# Patient Record
Sex: Male | Born: 1970 | Race: Black or African American | Hispanic: No | Marital: Single | Smoking: Former smoker
Health system: Southern US, Community
[De-identification: ages and names within clinical notes are randomized; demographics above are authoritative.]

## PROBLEM LIST (undated history)

## (undated) ENCOUNTER — Emergency Department (HOSPITAL_COMMUNITY): Admission: EM | Payer: Self-pay | Source: Home / Self Care

## (undated) ENCOUNTER — Ambulatory Visit (HOSPITAL_COMMUNITY): Payer: Self-pay

## (undated) DIAGNOSIS — E119 Type 2 diabetes mellitus without complications: Secondary | ICD-10-CM

## (undated) DIAGNOSIS — I219 Acute myocardial infarction, unspecified: Secondary | ICD-10-CM

## (undated) DIAGNOSIS — I119 Hypertensive heart disease without heart failure: Secondary | ICD-10-CM

## (undated) DIAGNOSIS — I255 Ischemic cardiomyopathy: Secondary | ICD-10-CM

## (undated) DIAGNOSIS — E785 Hyperlipidemia, unspecified: Secondary | ICD-10-CM

## (undated) DIAGNOSIS — Z720 Tobacco use: Secondary | ICD-10-CM

## (undated) DIAGNOSIS — I2510 Atherosclerotic heart disease of native coronary artery without angina pectoris: Secondary | ICD-10-CM

## (undated) DIAGNOSIS — I10 Essential (primary) hypertension: Secondary | ICD-10-CM

## (undated) DIAGNOSIS — I509 Heart failure, unspecified: Secondary | ICD-10-CM

## (undated) HISTORY — DX: Hyperlipidemia, unspecified: E78.5

## (undated) HISTORY — DX: Type 2 diabetes mellitus without complications: E11.9

## (undated) HISTORY — DX: Atherosclerotic heart disease of native coronary artery without angina pectoris: I25.10

## (undated) HISTORY — DX: Hypertensive heart disease without heart failure: I11.9

## (undated) HISTORY — DX: Ischemic cardiomyopathy: I25.5

## (undated) HISTORY — DX: Tobacco use: Z72.0

## (undated) HISTORY — PX: SHOULDER SURGERY: SHX246

## (undated) HISTORY — PX: FRACTURE SURGERY: SHX138

---

## 1998-03-18 ENCOUNTER — Emergency Department (HOSPITAL_COMMUNITY): Admission: EM | Admit: 1998-03-18 | Discharge: 1998-03-18 | Payer: Self-pay | Admitting: *Deleted

## 1998-11-11 ENCOUNTER — Encounter: Payer: Self-pay | Admitting: Emergency Medicine

## 1998-11-11 ENCOUNTER — Emergency Department (HOSPITAL_COMMUNITY): Admission: EM | Admit: 1998-11-11 | Discharge: 1998-11-11 | Payer: Self-pay | Admitting: Emergency Medicine

## 1999-10-24 ENCOUNTER — Encounter: Payer: Self-pay | Admitting: Emergency Medicine

## 1999-10-24 ENCOUNTER — Emergency Department (HOSPITAL_COMMUNITY): Admission: EM | Admit: 1999-10-24 | Discharge: 1999-10-24 | Payer: Self-pay | Admitting: Emergency Medicine

## 2000-03-21 ENCOUNTER — Emergency Department (HOSPITAL_COMMUNITY): Admission: EM | Admit: 2000-03-21 | Discharge: 2000-03-21 | Payer: Self-pay | Admitting: Emergency Medicine

## 2000-07-04 ENCOUNTER — Emergency Department (HOSPITAL_COMMUNITY): Admission: EM | Admit: 2000-07-04 | Discharge: 2000-07-05 | Payer: Self-pay | Admitting: Emergency Medicine

## 2000-07-05 ENCOUNTER — Emergency Department (HOSPITAL_COMMUNITY): Admission: EM | Admit: 2000-07-05 | Discharge: 2000-07-05 | Payer: Self-pay | Admitting: Emergency Medicine

## 2000-08-17 ENCOUNTER — Encounter: Payer: Self-pay | Admitting: Emergency Medicine

## 2000-08-17 ENCOUNTER — Emergency Department (HOSPITAL_COMMUNITY): Admission: EM | Admit: 2000-08-17 | Discharge: 2000-08-17 | Payer: Self-pay | Admitting: Emergency Medicine

## 2003-02-11 ENCOUNTER — Emergency Department (HOSPITAL_COMMUNITY): Admission: EM | Admit: 2003-02-11 | Discharge: 2003-02-11 | Payer: Self-pay | Admitting: Emergency Medicine

## 2003-11-11 ENCOUNTER — Emergency Department (HOSPITAL_COMMUNITY): Admission: EM | Admit: 2003-11-11 | Discharge: 2003-11-11 | Payer: Self-pay | Admitting: Emergency Medicine

## 2005-03-02 ENCOUNTER — Emergency Department (HOSPITAL_COMMUNITY): Admission: EM | Admit: 2005-03-02 | Discharge: 2005-03-02 | Payer: Self-pay | Admitting: Family Medicine

## 2006-01-14 ENCOUNTER — Emergency Department (HOSPITAL_COMMUNITY): Admission: EM | Admit: 2006-01-14 | Discharge: 2006-01-14 | Payer: Self-pay | Admitting: Emergency Medicine

## 2006-05-27 ENCOUNTER — Emergency Department (HOSPITAL_COMMUNITY): Admission: EM | Admit: 2006-05-27 | Discharge: 2006-05-27 | Payer: Self-pay | Admitting: Emergency Medicine

## 2006-11-17 ENCOUNTER — Emergency Department (HOSPITAL_COMMUNITY): Admission: EM | Admit: 2006-11-17 | Discharge: 2006-11-18 | Payer: Self-pay | Admitting: Emergency Medicine

## 2007-06-08 ENCOUNTER — Ambulatory Visit: Payer: Self-pay | Admitting: Nurse Practitioner

## 2007-06-08 DIAGNOSIS — I10 Essential (primary) hypertension: Secondary | ICD-10-CM | POA: Insufficient documentation

## 2007-06-08 LAB — CONVERTED CEMR LAB
Bilirubin Urine: NEGATIVE
Glucose, Urine, Semiquant: NEGATIVE
Ketones, urine, test strip: NEGATIVE
Nitrite: NEGATIVE
Protein, U semiquant: NEGATIVE
Specific Gravity, Urine: 1.02
Urobilinogen, UA: 0.2
WBC Urine, dipstick: NEGATIVE
pH: 6

## 2007-06-09 ENCOUNTER — Encounter (INDEPENDENT_AMBULATORY_CARE_PROVIDER_SITE_OTHER): Payer: Self-pay | Admitting: Nurse Practitioner

## 2007-06-09 LAB — CONVERTED CEMR LAB
ALT: 97 units/L — ABNORMAL HIGH (ref 0–53)
AST: 46 units/L — ABNORMAL HIGH (ref 0–37)
Albumin: 4.9 g/dL (ref 3.5–5.2)
Alkaline Phosphatase: 145 units/L — ABNORMAL HIGH (ref 39–117)
BUN: 9 mg/dL (ref 6–23)
Basophils Absolute: 0.1 10*3/uL (ref 0.0–0.1)
Basophils Relative: 1 % (ref 0–1)
CO2: 24 meq/L (ref 19–32)
Calcium: 10.4 mg/dL (ref 8.4–10.5)
Chloride: 102 meq/L (ref 96–112)
Cholesterol: 208 mg/dL — ABNORMAL HIGH (ref 0–200)
Creatinine, Ser: 0.94 mg/dL (ref 0.40–1.50)
Eosinophils Absolute: 0.3 10*3/uL (ref 0.0–0.7)
Eosinophils Relative: 4 % (ref 0–5)
Glucose, Bld: 97 mg/dL (ref 70–99)
HCT: 56.5 % — ABNORMAL HIGH (ref 39.0–52.0)
HCV Ab: NEGATIVE
HDL: 45 mg/dL (ref >39)
Hemoglobin: 18 g/dL — ABNORMAL HIGH (ref 13.0–17.0)
Hep A IgM: NEGATIVE
Hep B C IgM: NEGATIVE
Hepatitis B Surface Ag: NEGATIVE
LDL Cholesterol: 127 mg/dL — ABNORMAL HIGH (ref 0–99)
Lymphocytes Relative: 26 % (ref 12–46)
Lymphs Abs: 2.4 10*3/uL (ref 0.7–4.0)
MCHC: 31.9 g/dL (ref 30.0–36.0)
MCV: 91 fL (ref 78.0–100.0)
Monocytes Absolute: 0.6 10*3/uL (ref 0.1–1.0)
Monocytes Relative: 6 % (ref 3–12)
Neutro Abs: 5.7 10*3/uL (ref 1.7–7.7)
Neutrophils Relative %: 63 % (ref 43–77)
Platelets: 204 10*3/uL (ref 150–400)
Potassium: 4.3 meq/L (ref 3.5–5.3)
RBC: 6.21 M/uL — ABNORMAL HIGH (ref 4.22–5.81)
RDW: 14 % (ref 11.5–15.5)
Sodium: 144 meq/L (ref 135–145)
TSH: 1.072 microintl units/mL (ref 0.350–5.50)
Total Bilirubin: 0.5 mg/dL (ref 0.3–1.2)
Total CHOL/HDL Ratio: 4.6
Total Protein: 7.7 g/dL (ref 6.0–8.3)
Triglycerides: 181 mg/dL — ABNORMAL HIGH (ref <150)
VLDL: 36 mg/dL (ref 0–40)
WBC: 9 10*3/uL (ref 4.0–10.5)

## 2007-06-13 ENCOUNTER — Encounter (INDEPENDENT_AMBULATORY_CARE_PROVIDER_SITE_OTHER): Payer: Self-pay | Admitting: Nurse Practitioner

## 2007-06-22 ENCOUNTER — Ambulatory Visit: Payer: Self-pay | Admitting: Nurse Practitioner

## 2007-06-22 ENCOUNTER — Ambulatory Visit: Payer: Self-pay | Admitting: *Deleted

## 2007-07-06 ENCOUNTER — Ambulatory Visit: Payer: Self-pay | Admitting: Nurse Practitioner

## 2007-07-27 ENCOUNTER — Ambulatory Visit: Payer: Self-pay | Admitting: Nurse Practitioner

## 2007-07-27 ENCOUNTER — Telehealth (INDEPENDENT_AMBULATORY_CARE_PROVIDER_SITE_OTHER): Payer: Self-pay | Admitting: Nurse Practitioner

## 2007-09-27 ENCOUNTER — Ambulatory Visit: Payer: Self-pay | Admitting: Nurse Practitioner

## 2007-09-27 LAB — CONVERTED CEMR LAB
ALT: 33 units/L (ref 0–53)
AST: 16 units/L (ref 0–37)
Albumin: 4.4 g/dL (ref 3.5–5.2)
Alkaline Phosphatase: 160 units/L — ABNORMAL HIGH (ref 39–117)
Bilirubin, Direct: 0.1 mg/dL (ref 0.0–0.3)
Cholesterol: 179 mg/dL (ref 0–200)
HDL: 33 mg/dL — ABNORMAL LOW (ref >39)
Indirect Bilirubin: 0.6 mg/dL (ref 0.0–0.9)
LDL Cholesterol: 118 mg/dL — ABNORMAL HIGH (ref 0–99)
Total Bilirubin: 0.7 mg/dL (ref 0.3–1.2)
Total CHOL/HDL Ratio: 5.4
Total Protein: 7.2 g/dL (ref 6.0–8.3)
Triglycerides: 141 mg/dL (ref <150)
VLDL: 28 mg/dL (ref 0–40)

## 2007-09-28 ENCOUNTER — Encounter (INDEPENDENT_AMBULATORY_CARE_PROVIDER_SITE_OTHER): Payer: Self-pay | Admitting: Nurse Practitioner

## 2007-10-10 ENCOUNTER — Ambulatory Visit: Payer: Self-pay | Admitting: Nurse Practitioner

## 2008-05-18 ENCOUNTER — Ambulatory Visit: Payer: Self-pay | Admitting: Infectious Disease

## 2008-05-18 LAB — CONVERTED CEMR LAB
CO2: 26 meq/L
Calcium: 10.2 mg/dL
Chloride: 92 meq/L
Glucose, Bld: 474 mg/dL
Platelets: 287 10*3/uL
Potassium: 4.1 meq/L
RBC: 6.25 M/uL
Sodium: 130 meq/L
WBC: 9.6 10*3/uL

## 2008-05-19 ENCOUNTER — Observation Stay (HOSPITAL_COMMUNITY): Admission: EM | Admit: 2008-05-19 | Discharge: 2008-05-19 | Payer: Self-pay | Admitting: Emergency Medicine

## 2008-05-28 ENCOUNTER — Ambulatory Visit: Payer: Self-pay | Admitting: Nurse Practitioner

## 2008-05-28 DIAGNOSIS — E119 Type 2 diabetes mellitus without complications: Secondary | ICD-10-CM

## 2008-05-28 HISTORY — DX: Type 2 diabetes mellitus without complications: E11.9

## 2008-05-28 LAB — CONVERTED CEMR LAB
Cholesterol, target level: 200 mg/dL
HDL goal, serum: 40 mg/dL
Hgb A1c MFr Bld: 10.7 %
LDL Goal: 100 mg/dL

## 2008-06-18 ENCOUNTER — Encounter (INDEPENDENT_AMBULATORY_CARE_PROVIDER_SITE_OTHER): Payer: Self-pay | Admitting: Nurse Practitioner

## 2008-06-18 ENCOUNTER — Ambulatory Visit: Payer: Self-pay | Admitting: Family Medicine

## 2008-06-18 LAB — CONVERTED CEMR LAB
Bilirubin Urine: NEGATIVE
Blood in Urine, dipstick: NEGATIVE
Ketones, urine, test strip: NEGATIVE
Microalb, Ur: 0.65 mg/dL (ref 0.00–1.89)
Nitrite: NEGATIVE
Protein, U semiquant: NEGATIVE
Specific Gravity, Urine: 1.02
Urobilinogen, UA: NEGATIVE

## 2008-06-20 ENCOUNTER — Ambulatory Visit: Payer: Self-pay | Admitting: Nurse Practitioner

## 2008-06-20 LAB — CONVERTED CEMR LAB
AST: 17 units/L (ref 0–37)
Albumin: 4.8 g/dL (ref 3.5–5.2)
Alkaline Phosphatase: 119 units/L — ABNORMAL HIGH (ref 39–117)
Band Neutrophils: 0 % (ref 0–10)
Basophils Absolute: 0 10*3/uL (ref 0.0–0.1)
Calcium: 10.3 mg/dL (ref 8.4–10.5)
Chloride: 102 meq/L (ref 96–112)
HCT: 50.4 % (ref 39.0–52.0)
LDL Cholesterol: 105 mg/dL — ABNORMAL HIGH (ref 0–99)
Lymphocytes Relative: 20 % (ref 12–46)
Lymphs Abs: 1.4 10*3/uL (ref 0.7–4.0)
Monocytes Absolute: 0.7 10*3/uL (ref 0.1–1.0)
Neutro Abs: 4.6 10*3/uL (ref 1.7–7.7)
Platelets: 267 10*3/uL (ref 150–400)
Potassium: 4.4 meq/L (ref 3.5–5.3)
RBC: 5.85 M/uL — ABNORMAL HIGH (ref 4.22–5.81)
Sodium: 140 meq/L (ref 135–145)
Total Protein: 7.5 g/dL (ref 6.0–8.3)
WBC: 6.9 10*3/uL (ref 4.0–10.5)

## 2008-06-25 ENCOUNTER — Telehealth (INDEPENDENT_AMBULATORY_CARE_PROVIDER_SITE_OTHER): Payer: Self-pay | Admitting: Nurse Practitioner

## 2008-06-27 ENCOUNTER — Ambulatory Visit: Payer: Self-pay | Admitting: Nurse Practitioner

## 2008-07-09 ENCOUNTER — Ambulatory Visit: Payer: Self-pay | Admitting: Nurse Practitioner

## 2008-07-11 ENCOUNTER — Ambulatory Visit: Payer: Self-pay | Admitting: Nurse Practitioner

## 2008-07-16 ENCOUNTER — Telehealth (INDEPENDENT_AMBULATORY_CARE_PROVIDER_SITE_OTHER): Payer: Self-pay | Admitting: Nurse Practitioner

## 2008-07-18 ENCOUNTER — Encounter (INDEPENDENT_AMBULATORY_CARE_PROVIDER_SITE_OTHER): Payer: Self-pay | Admitting: Nurse Practitioner

## 2008-08-07 ENCOUNTER — Encounter (INDEPENDENT_AMBULATORY_CARE_PROVIDER_SITE_OTHER): Payer: Self-pay | Admitting: *Deleted

## 2008-08-13 ENCOUNTER — Ambulatory Visit: Payer: Self-pay | Admitting: Nurse Practitioner

## 2008-08-13 DIAGNOSIS — E8881 Metabolic syndrome: Secondary | ICD-10-CM

## 2008-08-13 HISTORY — DX: Metabolic syndrome: E88.81

## 2008-08-13 HISTORY — DX: Metabolic syndrome: E88.810

## 2008-10-02 ENCOUNTER — Telehealth (INDEPENDENT_AMBULATORY_CARE_PROVIDER_SITE_OTHER): Payer: Self-pay | Admitting: Nurse Practitioner

## 2008-11-28 ENCOUNTER — Ambulatory Visit: Payer: Self-pay | Admitting: Nurse Practitioner

## 2008-11-28 LAB — CONVERTED CEMR LAB: Blood Glucose, Fingerstick: 131

## 2008-12-02 LAB — CONVERTED CEMR LAB: Hgb A1c MFr Bld: 6.5 % — ABNORMAL HIGH (ref 4.6–6.1)

## 2009-01-10 ENCOUNTER — Ambulatory Visit: Payer: Self-pay | Admitting: Nurse Practitioner

## 2009-01-13 ENCOUNTER — Encounter (INDEPENDENT_AMBULATORY_CARE_PROVIDER_SITE_OTHER): Payer: Self-pay | Admitting: Nurse Practitioner

## 2009-02-28 ENCOUNTER — Ambulatory Visit: Payer: Self-pay | Admitting: Nurse Practitioner

## 2009-02-28 LAB — CONVERTED CEMR LAB
Blood Glucose, Fingerstick: 104
Hgb A1c MFr Bld: 6.1 %

## 2009-04-30 ENCOUNTER — Telehealth (INDEPENDENT_AMBULATORY_CARE_PROVIDER_SITE_OTHER): Payer: Self-pay | Admitting: *Deleted

## 2009-06-04 ENCOUNTER — Emergency Department (HOSPITAL_COMMUNITY): Admission: EM | Admit: 2009-06-04 | Discharge: 2009-06-05 | Payer: Self-pay | Admitting: Emergency Medicine

## 2009-08-05 ENCOUNTER — Ambulatory Visit: Payer: Self-pay | Admitting: Nurse Practitioner

## 2009-08-05 DIAGNOSIS — F17200 Nicotine dependence, unspecified, uncomplicated: Secondary | ICD-10-CM

## 2009-08-05 HISTORY — DX: Nicotine dependence, unspecified, uncomplicated: F17.200

## 2009-08-05 LAB — CONVERTED CEMR LAB
Alkaline Phosphatase: 93 units/L (ref 39–117)
Bilirubin, Direct: 0.1 mg/dL (ref 0.0–0.3)
Blood Glucose, Fingerstick: 117
Glucose, Urine, Semiquant: NEGATIVE
Hgb A1c MFr Bld: 10.2 %
Indirect Bilirubin: 0.5 mg/dL (ref 0.0–0.9)
LDL Cholesterol: 106 mg/dL — ABNORMAL HIGH (ref 0–99)
Protein, U semiquant: 30
Specific Gravity, Urine: 1.025
Total Bilirubin: 0.6 mg/dL (ref 0.3–1.2)
Triglycerides: 77 mg/dL (ref <150)
WBC Urine, dipstick: NEGATIVE
pH: 6

## 2009-08-06 ENCOUNTER — Encounter (INDEPENDENT_AMBULATORY_CARE_PROVIDER_SITE_OTHER): Payer: Self-pay | Admitting: Nurse Practitioner

## 2009-08-16 ENCOUNTER — Emergency Department (HOSPITAL_COMMUNITY): Admission: EM | Admit: 2009-08-16 | Discharge: 2009-08-16 | Payer: Self-pay | Admitting: Emergency Medicine

## 2009-09-16 ENCOUNTER — Ambulatory Visit: Payer: Self-pay | Admitting: Nurse Practitioner

## 2009-10-23 ENCOUNTER — Telehealth (INDEPENDENT_AMBULATORY_CARE_PROVIDER_SITE_OTHER): Payer: Self-pay | Admitting: Nurse Practitioner

## 2009-10-29 ENCOUNTER — Ambulatory Visit: Payer: Self-pay | Admitting: Nurse Practitioner

## 2009-10-29 LAB — CONVERTED CEMR LAB
Bilirubin Urine: NEGATIVE
Blood Glucose, AC Bkfst: 115 mg/dL
Glucose, Urine, Semiquant: NEGATIVE
Hgb A1c MFr Bld: 5.9 %
Ketones, urine, test strip: NEGATIVE
Specific Gravity, Urine: 1.01
pH: 5.5

## 2010-02-03 ENCOUNTER — Telehealth (INDEPENDENT_AMBULATORY_CARE_PROVIDER_SITE_OTHER): Payer: Self-pay | Admitting: Nurse Practitioner

## 2010-05-10 LAB — CONVERTED CEMR LAB
Blood Glucose, Fingerstick: 130
Hgb A1c MFr Bld: 7.3 %

## 2010-05-12 NOTE — Progress Notes (Signed)
Summary: stop smoking medication  Phone Note Call from Patient Call back at [facility] Phone [phone] Call back at [phone]   Summary of Call: The pt knows that the provider will get upset with him because he quit smoking two years ago and he started last month smoking ciggars and he is wondering if the provider can prescribe him a prescription for him to stop smoking again.  [facility] [address] Tera Partridge FNP Initial call taken by: Manon Hilding,  April 30, 2009 8:47 AM  Follow-up for Phone Call        forward to N. Daphine Deutscher, FNP Follow-up by: Levon Hedger,  April 30, 2009 4:56 PM  Additional Follow-up for Phone Call Additional follow up Details #1::        YES, I AM VERY UPSET!!! Smoking increases his blood pressure and cholesterol both of which he has worked so hard to improve. I'm not sure if the health department has Chantix(did he get it from there before) I know [place] [facility] has it. Rx in the basket Additional Follow-up by: Lehman Prom FNP,  April 30, 2009 5:20 PM    Additional Follow-up for Phone Call Additional follow up Details #2::    FAXED TO [facility] Follow-up by: Arta Bruce,  May 01, 2009 9:39 AM  New/Updated Medications: CHANTIX STARTING MONTH PAK 0.5 MG X 11 & 1 MG X 42 TABS (VARENICLINE TARTRATE) Take as directed Prescriptions: CHANTIX STARTING MONTH PAK 0.5 MG X 11 & 1 MG X 42 TABS (VARENICLINE TARTRATE) Take as directed  #1 month qs x 0   Entered and Authorized by:   Lehman Prom FNP   Signed by:   Lehman Prom FNP on 04/30/2009   Method used:   Printed then faxed to ...       [facility] (retail)       [address] [place], [place]  [address]       Ph: [phone]       Fax: [phone]   RxID:   [id number]

## 2010-05-12 NOTE — Assessment & Plan Note (Signed)
Summary: Diabetes/HTN  Nurse Visit   Vital Signs:  Patient profile:   40 year old male BP sitting:   136 / 87  (right arm)  Vitals Entered By: Dutch Quint RN (October 29, 2009 10:45 AM) CC: F/U CBG, A1C and U/A Is Patient Diabetic? Yes Did you bring your meter with you today? No Pain Assessment Patient in pain? no      CBG Result 115 CBG Device ID [id number]  Does patient need assistance? Functional Status Self care Ambulation Normal Comments Denies polyphagia, polydypsia or polyuria.  Denies dizziness, change of vision or headaches.  States he does not check his CBG daily, but is taking his meds.   Allergies: No Known Drug Allergies  Patient Instructions: 1)  Diabetes and blood pressure improved 2)  Keep next scheduled office visit  Impression & Recommendations:  Problem # 1:  DIABETES MELLITUS (ICD-250.00)  His updated medication list for this problem includes:    Metformin Hcl 500 Mg Tabs (Metformin hcl) ..... One tablet by mouth two times a day for diabetes    Avalide 300-25 Mg Tabs (Irbesartan-hydrochlorothiazide) .Marland Kitchen... 1 tablet by mouth daily for blood pressure  Orders: Capillary Blood Glucose/CBG (69629) Est. Patient Level I (52841) Hgb A1C (32440NU) UA Dipstick w/o Micro (automated)  (81003)  Problem # 2:  HYPERTENSION, BENIGN ESSENTIAL (ICD-401.1)  His updated medication list for this problem includes:    Norvasc 10 Mg Tabs (Amlodipine besylate) .Marland Kitchen... 1 tablet by mouth daily for blood pressure    Avalide 300-25 Mg Tabs (Irbesartan-hydrochlorothiazide) .Marland Kitchen... 1 tablet by mouth daily for blood pressure  Complete Medication List: 1)  Lipitor 10 Mg Tabs (Atorvastatin calcium) .... Hold 2)  Metformin Hcl 500 Mg Tabs (Metformin hcl) .... One tablet by mouth two times a day for diabetes 3)  Glucometer Elite Classic Kit (Blood glucose monitoring suppl) .... Dispense meter to check blood sugar dx 250.00 4)  Sidekick Blood Glucose System Devi (Blood gluc meter  disp-strips) .... Dispense strips twice daily for blood sugar 5)  Lancets Misc (Lancets) .... Check blood sugar twice daily 6)  Norvasc 10 Mg Tabs (Amlodipine besylate) .Marland Kitchen.. 1 tablet by mouth daily for blood pressure 7)  Avalide 300-25 Mg Tabs (Irbesartan-hydrochlorothiazide) .Marland Kitchen.. 1 tablet by mouth daily for blood pressure  Laboratory Results   Urine Tests  Date/Time Received: 10/29/09  10:07  Routine Urinalysis   Color: lt. yellow Glucose: negative   (Normal Range: Negative) Bilirubin: negative   (Normal Range: Negative) Ketone: negative   (Normal Range: Negative) Spec. Gravity: 1.010   (Normal Range: 1.003-1.035) Blood: trace-intact   (Normal Range: Negative) pH: 5.5   (Normal Range: 5.0-8.0) Protein: negative   (Normal Range: Negative) Urobilinogen: 0.2   (Normal Range: 0-1) Nitrite: negative   (Normal Range: Negative) Leukocyte Esterace: negative   (Normal Range: Negative)     Blood Tests   Date/Time Received: 10/29/09  9:45 am  HGBA1C: 5.9%   (Normal Range: Non-Diabetic - 3-6%   Control Diabetic - 6-8%) CBG Random:: 115mg /dL CBG Fasting:: 115mg /dL     Orders Added: 1)  Capillary Blood Glucose/CBG [82948] 2)  Est. Patient Level I [99211] 3)  Hgb A1C [83036QW] 4)  UA Dipstick w/o Micro (automated)  [81003]

## 2010-05-12 NOTE — Assessment & Plan Note (Signed)
Summary: Diabetes/HTN   Vital Signs:  Patient profile:   40 year old male Weight:      170.7 pounds BMI:     28.51 BSA:     1.85 Temp:     98.7 degrees F oral Pulse rate:   94 / minute Pulse rhythm:   regular Resp:     20 per minute BP sitting:   138 / 88  (left arm) Cuff size:   regular  Vitals Entered By: Levon Hedger (September 16, 2009 11:56 AM) CC: follow-up visit DM, Hypertension Management, Lipid Management Is Patient Diabetic? Yes Pain Assessment Patient in pain? no      CBG Result 97 CBG Device ID A  Does patient need assistance? Functional Status Self care Ambulation Normal   CC:  follow-up visit DM, Hypertension Management, and Lipid Management.  History of Present Illness:  Pt into the office for diabetes     Diabetes Management History:      The patient is a 40 years old male who comes in for evaluation of Type 2 Diabetes Mellitus.  He has not been enrolled in the "Diabetic Education Program".  He states understanding of dietary principles but he is not following the appropriate diet.  No sensory loss is reported.  Self foot exams are not being performed.  He is not checking home blood sugars.  He says that he is not exercising regularly.        Hypoglycemic symptoms are not occurring.  No hyperglycemic symptoms are reported.        There are no symptoms to suggest diabetic complications.  The following changes have been made to his treatment plan since last visit: medication changes.  Treatment plan changes were initiated by patient.    Hypertension History:      He denies headache, chest pain, and palpitations.  He notes no problems with any antihypertensive medication side effects.  pt is not taking his meds as he should be.        Positive major cardiovascular risk factors include diabetes, hyperlipidemia, hypertension, and current tobacco user.  Negative major cardiovascular risk factors include male age less than 75 years old.        Further  assessment for target organ damage reveals no history of ASHD, cardiac end-organ damage (CHF/LVH), stroke/TIA, peripheral vascular disease, renal insufficiency, or hypertensive retinopathy.    Lipid Management History:      Positive NCEP/ATP III risk factors include diabetes, HDL cholesterol less than 40, current tobacco user, and hypertension.  Negative NCEP/ATP III risk factors include male age less than 30 years old, no ASHD (atherosclerotic heart disease), no prior stroke/TIA, no peripheral vascular disease, and no history of aortic aneurysm.        The patient states that he knows about the "Therapeutic Lifestyle Change" diet.  Comments include: no current medications.      Habits & Providers  Alcohol-Tobacco-Diet     Alcohol drinks/day: 0     Alcohol type: liquor     Tobacco Status: current     Tobacco Counseling: to quit use of tobacco products     Cigarette Packs/Day: 1     Year Quit: restarted in 2010  Exercise-Depression-Behavior     Does Patient Exercise: no     Exercise Counseling: to improve exercise regimen     Type of exercise: N/A     Have you felt down or hopeless? no     Have you felt little pleasure in things? no  Depression Counseling: not indicated; screening negative for depression     Drug Use: never     Seat Belt Use: 100     Sun Exposure: frequently  Allergies (verified): No Known Drug Allergies  Review of Systems Resp:  Denies shortness of breath. GI:  Complains of diarrhea; denies abdominal pain, nausea, and vomiting. GU:  Denies discharge. Endo:  Denies excessive thirst and excessive urination.  Physical Exam  General:  alert.   Head:  normocephalic.   Lungs:  normal breath sounds.   Heart:  normal rate and regular rhythm.   Msk:  normal ROM.   Neurologic:  alert & oriented X3.   Skin:  color normal.   Psych:  Oriented X3.     Impression & Recommendations:  Problem # 1:  DIABETES MELLITUS (ICD-250.00) Blood sugar still elevated and  pt admits he is not taking metformin gave pt option of changing to a different medication since his complaint is diarrhea. pt declined diarrhea only started after pt started skipping doses His updated medication list for this problem includes:    Metformin Hcl 500 Mg Tabs (Metformin hcl) ..... One tablet by mouth two times a day for diabetes    Avalide 300-25 Mg Tabs (Irbesartan-hydrochlorothiazide) .Marland Kitchen... 1 tablet by mouth daily for blood pressure  Orders: Capillary Blood Glucose/CBG (04540)  Problem # 2:  HYPERTENSION, BENIGN ESSENTIAL (ICD-401.1) advised pt to get meds refilled reviewed DASH diet His updated medication list for this problem includes:    Norvasc 10 Mg Tabs (Amlodipine besylate) .Marland Kitchen... 1 tablet by mouth daily for blood pressure    Avalide 300-25 Mg Tabs (Irbesartan-hydrochlorothiazide) .Marland Kitchen... 1 tablet by mouth daily for blood pressure  Problem # 3:  HYPERLIPIDEMIA (ICD-272.4) labs done on last visit  no need for meds at this time His updated medication list for this problem includes:    Lipitor 10 Mg Tabs (Atorvastatin calcium) ..... Hold  Problem # 4:  TOBACCO ABUSE (ICD-305.1) pt started smoking again - advised cessation  Complete Medication List: 1)  Lipitor 10 Mg Tabs (Atorvastatin calcium) .... Hold 2)  Metformin Hcl 500 Mg Tabs (Metformin hcl) .... One tablet by mouth two times a day for diabetes 3)  Glucometer Elite Classic Kit (Blood glucose monitoring suppl) .... Dispense meter to check blood sugar dx 250.00 4)  Sidekick Blood Glucose System Devi (Blood gluc meter disp-strips) .... Dispense strips twice daily for blood sugar 5)  Lancets Misc (Lancets) .... Check blood sugar twice daily 6)  Norvasc 10 Mg Tabs (Amlodipine besylate) .Marland Kitchen.. 1 tablet by mouth daily for blood pressure 7)  Avalide 300-25 Mg Tabs (Irbesartan-hydrochlorothiazide) .Marland Kitchen.. 1 tablet by mouth daily for blood pressure  Diabetes Management Assessment/Plan:      The following lipid goals have  been established for the patient: Total cholesterol goal of 200; LDL cholesterol goal of 100; HDL cholesterol goal of 40; Triglyceride goal of 150.  His blood pressure goal is < 130/80.    Hypertension Assessment/Plan:      The patient's hypertensive risk group is category C: Target organ damage and/or diabetes.  His calculated 10 year risk of coronary heart disease is 18 %.  Today's blood pressure is 138/88.  His blood pressure goal is < 130/80.  Lipid Assessment/Plan:      Based on NCEP/ATP III, the patient's risk factor category is "history of diabetes".  The patient's lipid goals are as follows: Total cholesterol goal is 200; LDL cholesterol goal is 100; HDL cholesterol goal is 40;  Triglyceride goal is 150.    Patient Instructions: 1)  Be sure to get an eligibility appointment. 2)  Diabetes - you need to get a glucometer to check blood sugar. 3)  you need to restart your metformin and take twice per day. 4)  Last Hgba1c = 10.2 5)  If you do not want to take the glucophage then you will need to change to actos. 6)  Cholesterol - good on the last visit - no medications for now 7)  Follow up in 2 months for diabetes.

## 2010-05-12 NOTE — Letter (Signed)
Summary: Lipid Letter  [facility]  [address] [place], [place] [address]   Phone: [phone]  Fax: [phone]    08/06/2009  Jimmy Parker [address] [place], [place]  [address]  Dear Alinda Money:  We have carefully reviewed your last lipid profile from 08/05/2009 and the results are noted below with a summary of recommendations for lipid management.    Cholesterol:       156     Goal: less than 200   HDL "good" Cholesterol:   35     Goal: greater than 40   LDL "bad" Cholesterol:   106     Goal: less than 70   Triglycerides:       77     Goal: less than 150    Your cholesterol has improved with your recent weight loss.  No need for medications at this time.    Current Medications: 1)    Lipitor 10 Mg Tabs (Atorvastatin calcium) .... Hold 2)    Metformin Hcl 500 Mg Tabs (Metformin hcl) .... One tablet by mouth two times a day for diabetes 3)    Glucometer Elite Classic  Kit (Blood glucose monitoring suppl) .... Dispense meter to check blood sugar dx 250.00 4)    Sidekick Blood Glucose System  Devi (Blood gluc meter disp-strips) .... Dispense strips twice daily for blood sugar 5)    Lancets  Misc (Lancets) .... Check blood sugar twice daily 6)    Norvasc 10 Mg Tabs (Amlodipine besylate) .Marland Kitchen.. 1 tablet by mouth daily for blood pressure 7)    Avalide 300-25 Mg Tabs (Irbesartan-hydrochlorothiazide) .Marland Kitchen.. 1 tablet by mouth daily for blood pressure  If you have any questions, please call. We appreciate being able to work with you.   Sincerely,    [facility] Lehman Prom FNP

## 2010-05-12 NOTE — Progress Notes (Signed)
Summary: Query:  Refill Norvasc?  Phone Note Outgoing Call   Summary of Call: Do you want to refill Norvasc?  Last seen 09/16/09. Initial call taken by: Dutch Quint RN,  February 03, 2010 2:07 PM  Follow-up for Phone Call        yes, ok to fill Follow-up by: Lehman Prom FNP,  February 03, 2010 7:08 PM  Additional Follow-up for Phone Call Additional follow up Details #1::        Refill faxed to [facility].  Dutch Quint RN  February 04, 2010 11:08 AM     Prescriptions: NORVASC 10 MG TABS (AMLODIPINE BESYLATE) 1 tablet by mouth daily for blood pressure  #30 x 3   Entered by:   Dutch Quint RN   Authorized by:   Lehman Prom FNP   Signed by:   Dutch Quint RN on 02/04/2010   Method used:   Historical   RxID:   [id number]

## 2010-05-12 NOTE — Assessment & Plan Note (Signed)
Summary: Diabetes/HTN   Vital Signs:  Patient profile:   40 year old male Weight:      169.1 pounds Temp:     98.2 degrees F oral Pulse rate:   82 / minute Pulse rhythm:   regular Resp:     20 per minute BP sitting:   130 / 87  (left arm) Cuff size:   large  Vitals Entered By: Levon Hedger (August 05, 2009 9:42 AM)  Diabetic Foot Exam Foot Inspection Is there a history of a foot ulcer?              No Is there a foot ulcer now?              No Is there swelling or an abnormal foot shape?          No Are the toenails long?                Yes Are the toenails thick?                No Are the toenails ingrown?              No Is there heavy callous build-up?              No Is there pain in the calf muscle (Intermittent claudication) when walking?    NoIs there a claw toe deformity?              No Is there elevated skin temperature?            No Is there limited ankle dorsiflexion?            No Is there foot or ankle muscle weakness?            No  Diabetic Foot Care Education Pulse Check          Right Foot          Left Foot Dorsalis Pedis:        normal            normal  High Risk Feet? No   10-g (5.07) Semmes-Weinstein Monofilament Test Performed by: Levon Hedger          Right Foot          Left Foot Visual Inspection               CC: follow-up visit HTN, DM, Hypertension Management, Lipid Management Is Patient Diabetic? Yes Pain Assessment Patient in pain? no      CBG Result 117 CBG Device ID B  Does patient need assistance? Functional Status Self care Ambulation Normal   CC:  follow-up visit HTN, DM, Hypertension Management, and Lipid Management.  History of Present Illness:  Pt into the office for f/u - diabetes  Diabetes - pt admits that he has NOT been taking the  metformin as ordered. He is only taking the medication about twice per week. Pt has NOT been checking his blood sugar meter at home.  He does have a glucometer but thinks it  needs more batteries.  Obesity - lost 22 pounds since his last visit.  Attributes it to restarting smoking.  report that he does not eat as much.  Exercise has not improved  Diabetes Management History:      The patient is a 40 years old male who comes in for evaluation of Type 2 Diabetes Mellitus.  He has not been enrolled in the "Diabetic Education Program".  He states lack of understanding of dietary principles and is not following his diet appropriately.  No sensory loss is reported.  Self foot exams are not being performed.  He is checking home blood sugars.  He says that he is not exercising regularly.        Hypoglycemic symptoms are not occurring.  No hyperglycemic symptoms are reported.  Other comments include: Pt has not been taking his metformin as ordered.        The following changes have been made to his treatment plan since last visit: medication changes.  Treatment plan changes were initiated by patient.    Hypertension History:      He denies headache, chest pain, and palpitations.  He notes no problems with any antihypertensive medication side effects.  Pt has purchased a blood pressure machine and is checking his blood pressure daily.        Positive major cardiovascular risk factors include diabetes, hyperlipidemia, hypertension, and current tobacco user.  Negative major cardiovascular risk factors include male age less than 73 years old.        Further assessment for target organ damage reveals no history of ASHD, cardiac end-organ damage (CHF/LVH), stroke/TIA, peripheral vascular disease, renal insufficiency, or hypertensive retinopathy.    Lipid Management History:      Positive NCEP/ATP III risk factors include diabetes, HDL cholesterol less than 40, current tobacco user, and hypertension.  Negative NCEP/ATP III risk factors include male age less than 43 years old, no ASHD (atherosclerotic heart disease), no prior stroke/TIA, no peripheral vascular disease, and no history of  aortic aneurysm.        The patient states that he knows about the "Therapeutic Lifestyle Change" diet.  The patient does not know about adjunctive measures for cholesterol lowering.  Adjunctive measures started by the patient include weight reduction.  Comments include: Pt is NOT taking lipitor.      Habits & Providers  Alcohol-Tobacco-Diet     Alcohol drinks/day: 0     Alcohol type: liquor     Tobacco Status: current     Tobacco Counseling: to quit use of tobacco products     Cigarette Packs/Day: 1     Year Quit: restarted in 2010  Exercise-Depression-Behavior     Does Patient Exercise: no     Exercise Counseling: to improve exercise regimen     Type of exercise: pt has joined a gym but has not gone     Have you felt down or hopeless? no     Have you felt little pleasure in things? no     Depression Counseling: not indicated; screening negative for depression     Drug Use: never     Seat Belt Use: 100     Sun Exposure: frequently  Allergies (verified): No Known Drug Allergies  Social History: Smoking Status:  current  Review of Systems CV:  Denies chest pain or discomfort. Resp:  Denies cough. GI:  Denies abdominal pain, nausea, and vomiting.  Physical Exam  General:  alert.   Head:  normocephalic.   Lungs:  normal breath sounds.   Heart:  normal rate and regular rhythm.   Abdomen:  normal bowel sounds.   Msk:  up to the exam table Neurologic:  alert & oriented X3.   Psych:  Oriented X3.    Diabetes Management Exam:    Foot Exam (with socks and/or shoes not present):       Sensory-Monofilament:  Left foot: normal          Right foot: normal       Nails:          Left foot: too long          Right foot: too long   Impression & Recommendations:  Problem # 1:  DIABETES MELLITUS (ICD-250.00) UNCONTROLLED advised pt that he needs to take medications as ordered he should be checking blood sugar at least once daily before breakfast His updated  medication list for this problem includes:    Metformin Hcl 500 Mg Tabs (Metformin hcl) ..... One tablet by mouth two times a day for diabetes    Avalide 300-25 Mg Tabs (Irbesartan-hydrochlorothiazide) .Marland Kitchen... 1 tablet by mouth daily for blood pressure  Orders: Capillary Blood Glucose/CBG (82948) Hgb A1C (33295JO)  Problem # 2:  HYPERLIPIDEMIA (ICD-272.4) Pt is NOT taking meds. Advised pt that this provider keeps getting refill requests  will check lipids again and it is likely that it will be higher than before given uncontrolled diabetes His updated medication list for this problem includes:    Lipitor 10 Mg Tabs (Atorvastatin calcium) ..... Hold  Orders: T-Lipid Profile [phone]) T-Hepatic Function [phone])  Problem # 3:  HYPERTENSION, BENIGN ESSENTIAL (ICD-401.1) DASH diet reviewed stable His updated medication list for this problem includes:    Norvasc 10 Mg Tabs (Amlodipine besylate) .Marland Kitchen... 1 tablet by mouth daily for blood pressure    Avalide 300-25 Mg Tabs (Irbesartan-hydrochlorothiazide) .Marland Kitchen... 1 tablet by mouth daily for blood pressure  Problem # 4:  TOBACCO ABUSE (ICD-305.1) pt has restarted smoking in the past 3 months advised pt that he needs to quit The following medications were removed from the medication list:    Chantix Starting Month Pak 0.5 Mg X 11 & 1 Mg X 42 Tabs (Varenicline tartrate) .Marland Kitchen... Take as directed  Complete Medication List: 1)  Lipitor 10 Mg Tabs (Atorvastatin calcium) .... Hold 2)  Metformin Hcl 500 Mg Tabs (Metformin hcl) .... One tablet by mouth two times a day for diabetes 3)  Glucometer Elite Classic Kit (Blood glucose monitoring suppl) .... Dispense meter to check blood sugar dx 250.00 4)  Sidekick Blood Glucose System Devi (Blood gluc meter disp-strips) .... Dispense strips twice daily for blood sugar 5)  Lancets Misc (Lancets) .... Check blood sugar twice daily 6)  Norvasc 10 Mg Tabs (Amlodipine besylate) .Marland Kitchen.. 1 tablet by mouth daily  for blood pressure 7)  Avalide 300-25 Mg Tabs (Irbesartan-hydrochlorothiazide) .Marland Kitchen.. 1 tablet by mouth daily for blood pressure  Diabetes Management Assessment/Plan:      The following lipid goals have been established for the patient: Total cholesterol goal of 200; LDL cholesterol goal of 100; HDL cholesterol goal of 40; Triglyceride goal of 150.  His blood pressure goal is < 130/80.    Hypertension Assessment/Plan:      The patient's hypertensive risk group is category C: Target organ damage and/or diabetes.  His calculated 10 year risk of coronary heart disease is 18 %.  Today's blood pressure is 130/87.  His blood pressure goal is < 130/80.  Lipid Assessment/Plan:      Based on NCEP/ATP III, the patient's risk factor category is "history of diabetes".  The patient's lipid goals are as follows: Total cholesterol goal is 200; LDL cholesterol goal is 100; HDL cholesterol goal is 40; Triglyceride goal is 150.    Patient Instructions: 1)  Your HgbA1c  = 10.2 today 2)  UNCONTROLLED 3)  You need  to take metformin 500mg  by mouth two times a day  4)  Cholesterol rechecked today. Don't be surprised if it is higher than last check because your blood sugar is up. 5)  Remember blood sugar, blood pressure and cholesterol is like a triangle that when one is uncontrolled the rest follows. 6)  YOU NEED TO STOP SMOKING. 7)  Follow up in 6 weeks for diabetes. 8)  Bring blood sugar log with you (get new batteries for your machine) Prescriptions: METFORMIN HCL 500 MG TABS (METFORMIN HCL) One tablet by mouth two times a day for diabetes  #60 x 5   Entered and Authorized by:   Lehman Prom FNP   Signed by:   Lehman Prom FNP on 08/05/2009   Method used:   Print then Give to Patient   RxID:   [id number]    Last LDL:                                                 131 (01/10/2009 9:28:00 PM)        Diabetic Foot Exam Pulse Check          Right Foot          Left Foot Dorsalis Pedis:         normal            normal  High Risk Feet? No   10-g (5.07) Semmes-Weinstein Monofilament Test Performed by: Levon Hedger          Right Foot          Left Foot Visual Inspection               Test Control      normal         normal Site 1         normal         normal Site 2         normal         normal Site 3         normal         normal Site 4         normal         normal Site 5         normal         normal Site 6         normal         normal Site 7         normal         normal Site 8         normal         normal Site 9         normal         normal Site 10         normal         normal  Impression      normal         normal   Laboratory Results   Urine Tests  Date/Time Received: August 05, 2009 10:40 AM   Routine Urinalysis   Glucose: negative   (Normal Range: Negative) Bilirubin: small   (Normal Range: Negative) Ketone: negative   (Normal Range: Negative) Spec. Gravity: 1.025   (Normal Range: 1.003-1.035) Blood: negative   (  Normal Range: Negative) pH: 6.0   (Normal Range: 5.0-8.0) Protein: 30   (Normal Range: Negative) Urobilinogen: 0.2   (Normal Range: 0-1) Nitrite: negative   (Normal Range: Negative) Leukocyte Esterace: negative   (Normal Range: Negative)     Blood Tests   Date/Time Received: August 05, 2009 10:00 AM   HGBA1C: 10.2%   (Normal Range: Non-Diabetic - 3-6%   Control Diabetic - 6-8%) CBG Random:: 117      Laboratory Results   Urine Tests    Routine Urinalysis   Glucose: negative   (Normal Range: Negative) Bilirubin: small   (Normal Range: Negative) Ketone: negative   (Normal Range: Negative) Spec. Gravity: 1.025   (Normal Range: 1.003-1.035) Blood: negative   (Normal Range: Negative) pH: 6.0   (Normal Range: 5.0-8.0) Protein: 30   (Normal Range: Negative) Urobilinogen: 0.2   (Normal Range: 0-1) Nitrite: negative   (Normal Range: Negative) Leukocyte Esterace: negative   (Normal Range: Negative)     Blood Tests      HGBA1C: 10.2%   (Normal Range: Non-Diabetic - 3-6%   Control Diabetic - 6-8%) CBG Random:: 117mg /dL

## 2010-05-12 NOTE — Progress Notes (Signed)
Summary: ? TO ASK Jimmy Parker  Phone Note Call from Patient Call back at Home Phone [phone]   Reason for Call: Talk to Doctor Summary of Call: Jimmy Parker. MR Melgarejo SAYS HE WANTS TO ASK A QUESTION, DO YOU THINK THATIT WILL BE OK FOR HIM TO GO BACK ON THE ROAD AGAIN AND IF HIS A1C AND EVERTHING IS OK. Initial call taken by: Leodis Rains,  October 23, 2009 8:44 AM  Follow-up for Phone Call        Levon Hedger  October 23, 2009 1:11 PM Left message on machine for Parker to return call to the office.  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  He says that he know the requirement is that as long as you are not using insulin, he will go out with a cousin on a truck route as soon as next week and has gotten his license to ride along with him but he is wanting to get an clear bill from his provider first.   Additional Follow-up for Phone Call Additional follow up Details #1::        his last hgba1c = 10.2 in April 2011 If i remember correctly the guidelines for driving trucks is that diabetes must be CONTROLLED and at last check it was not. This is July (3 months since last Hgba1c) so if he wants to come for a nurse visit for Hgba1c, cbg and u/a if these values are better (hbga1c less than 7) then yes he would meet the guidelines Additional Follow-up by: Lehman Prom FNP,  October 27, 2009 8:26 AM    Additional Follow-up for Phone Call Additional follow up Details #2::    Parker. appt. for nurse visit and labs 10/29/09.   Follow-up by: Dutch Quint RN,  October 28, 2009 9:54 AM

## 2010-05-22 ENCOUNTER — Emergency Department (HOSPITAL_COMMUNITY)
Admission: EM | Admit: 2010-05-22 | Discharge: 2010-05-22 | Discharge status: Against Medical Advice | Payer: Self-pay | Attending: Emergency Medicine | Admitting: Emergency Medicine

## 2010-05-22 DIAGNOSIS — J3489 Other specified disorders of nose and nasal sinuses: Secondary | ICD-10-CM | POA: Insufficient documentation

## 2010-06-30 ENCOUNTER — Telehealth (INDEPENDENT_AMBULATORY_CARE_PROVIDER_SITE_OTHER): Payer: Self-pay | Admitting: Nurse Practitioner

## 2010-06-30 LAB — CBC
Hemoglobin: 16.9 g/dL (ref 13.0–17.0)
MCHC: 34.1 g/dL (ref 30.0–36.0)
Platelets: 246 10*3/uL (ref 150–400)
RDW: 13.7 % (ref 11.5–15.5)

## 2010-06-30 LAB — POCT CARDIAC MARKERS
CKMB, poc: <1 ng/mL — ABNORMAL LOW (ref 1.0–8.0)
CKMB, poc: <1 ng/mL — ABNORMAL LOW (ref 1.0–8.0)
Myoglobin, poc: 30.6 ng/mL (ref 12–200)
Myoglobin, poc: 40.3 ng/mL (ref 12–200)
Troponin i, poc: <0.05 ng/mL (ref 0.00–0.09)

## 2010-06-30 LAB — DIFFERENTIAL
Lymphs Abs: 2.5 10*3/uL (ref 0.7–4.0)
Monocytes Absolute: 0.6 10*3/uL (ref 0.1–1.0)
Monocytes Relative: 6 % (ref 3–12)
Neutro Abs: 8.1 10*3/uL — ABNORMAL HIGH (ref 1.7–7.7)
Neutrophils Relative %: 69 % (ref 43–77)

## 2010-06-30 LAB — COMPREHENSIVE METABOLIC PANEL
ALT: 22 U/L (ref 0–53)
Albumin: 3.9 g/dL (ref 3.5–5.2)
Calcium: 9.6 mg/dL (ref 8.4–10.5)
Glucose, Bld: 103 mg/dL — ABNORMAL HIGH (ref 70–99)
Potassium: 3.5 mEq/L (ref 3.5–5.1)
Sodium: 133 mEq/L — ABNORMAL LOW (ref 135–145)
Total Protein: 7 g/dL (ref 6.0–8.3)

## 2010-06-30 LAB — GLUCOSE, CAPILLARY: Glucose-Capillary: 145 mg/dL — ABNORMAL HIGH (ref 70–99)

## 2010-06-30 LAB — URINALYSIS, ROUTINE W REFLEX MICROSCOPIC
Bilirubin Urine: NEGATIVE
Hgb urine dipstick: NEGATIVE
Ketones, ur: NEGATIVE mg/dL
Protein, ur: NEGATIVE mg/dL
Urobilinogen, UA: 0.2 mg/dL (ref 0.0–1.0)

## 2010-06-30 LAB — RAPID URINE DRUG SCREEN, HOSP PERFORMED
Amphetamines: NOT DETECTED
Tetrahydrocannabinol: NOT DETECTED

## 2010-06-30 LAB — APTT: aPTT: 27 seconds (ref 24–37)

## 2010-06-30 LAB — PROTIME-INR: INR: 0.93 (ref 0.00–1.49)

## 2010-07-01 LAB — POCT CARDIAC MARKERS
CKMB, poc: <1 ng/mL — ABNORMAL LOW (ref 1.0–8.0)
Myoglobin, poc: 105 ng/mL (ref 12–200)
Troponin i, poc: <0.05 ng/mL (ref 0.00–0.09)

## 2010-07-01 LAB — BASIC METABOLIC PANEL
BUN: 7 mg/dL (ref 6–23)
Calcium: 9.6 mg/dL (ref 8.4–10.5)
Creatinine, Ser: 0.97 mg/dL (ref 0.4–1.5)
GFR calc non Af Amer: >60 mL/min (ref >60)
Glucose, Bld: 107 mg/dL — ABNORMAL HIGH (ref 70–99)
Potassium: 3.7 mEq/L (ref 3.5–5.1)

## 2010-07-01 LAB — DIFFERENTIAL
Basophils Absolute: 0 10*3/uL (ref 0.0–0.1)
Basophils Relative: 1 % (ref 0–1)
Eosinophils Absolute: 0.4 10*3/uL (ref 0.0–0.7)
Eosinophils Relative: 5 % (ref 0–5)
Lymphocytes Relative: 29 % (ref 12–46)
Lymphs Abs: 2.3 10*3/uL (ref 0.7–4.0)
Monocytes Absolute: 1.3 10*3/uL — ABNORMAL HIGH (ref 0.1–1.0)
Monocytes Relative: 17 % — ABNORMAL HIGH (ref 3–12)
Neutro Abs: 3.8 10*3/uL (ref 1.7–7.7)
Neutrophils Relative %: 49 % (ref 43–77)

## 2010-07-01 LAB — CBC
HCT: 53.1 % — ABNORMAL HIGH (ref 39.0–52.0)
Platelets: 224 10*3/uL (ref 150–400)
RDW: 12.9 % (ref 11.5–15.5)

## 2010-07-08 ENCOUNTER — Telehealth (INDEPENDENT_AMBULATORY_CARE_PROVIDER_SITE_OTHER): Payer: Self-pay | Admitting: Nurse Practitioner

## 2010-07-09 NOTE — Progress Notes (Signed)
Summary: Query:  Refill meds?  Phone Note Outgoing Call   Summary of Call: Last seen 09/16/09.  Has ELIGIBILITY RECERT 07/10/10.  Does not have HCTZ on med list, however has avalide.  No refills on avelide since 05/29/09 when it was written for #90 x1.  No refill requests for the ARB.  Metformin last written 08/05/09 x5 refills.  Refill metformin per protocol?  Please advise.  Initial call taken by: Dutch Quint RN,  June 30, 2010 11:31 AM  Follow-up for Phone Call        Jimmy Parker was on back order so he should have been taking hctz AND avapro.  He was previously using [facility]. Ask pt if there is any reason he has changed to [facility].  he will not be able to afford avapro at [facility] which is likely why he did not request.   can he get meds from [facility] - if so send refill request there.  If not, then send refills to [facility] Follow-up by: Lehman Prom FNP,  June 30, 2010 4:09 PM  Additional Follow-up for Phone Call Additional follow up Details #1::        States he has been getting meds from [facility] for about a year.  Has not been taking metformin, HCTZ and avapro because he's been out and avapro too expensive.  States no reason why he couldn't get meds at [facility].  Refills faxed to [facility].  Dutch Quint RN  July 01, 2010 5:01 PM     Prescriptions: AVALIDE 300-25 MG TABS (IRBESARTAN-HYDROCHLOROTHIAZIDE) 1 tablet by mouth daily for blood pressure  #90 x 0   Entered by:   Dutch Quint RN   Authorized by:   Lehman Prom FNP   Signed by:   Dutch Quint RN on 07/01/2010   Method used:   Print then Give to Patient   RxID:   [id number] METFORMIN HCL 500 MG TABS (METFORMIN HCL) One tablet by mouth two times a day for diabetes  #60 x 3   Entered by:   Dutch Quint RN   Authorized by:   Lehman Prom FNP   Signed by:   Dutch Quint RN on 07/01/2010   Method used:   Print then Give to Patient   RxID:   [id number]

## 2010-07-14 NOTE — Progress Notes (Signed)
Summary: Wants meds filled here, needs orange card renewed  Phone Note Call from Patient   Summary of Call: Spoke with Gershon Cull in [facility], wanting meds filled. Orange card has expired, has recert appt. 07/10/10.  Went to [facility] where refills were sent on 07/01/10 per pt. request, states he talked with Lynden Ang at [facility], unable to fill his meds, only eligible for MAP.  Avalide is on backorder.  Came here to get his meds filled, lisinopril, which was d/c'd back in 2010 due to being non-effective.  Spoke with pt. and advised that he cannot get his meds filled here until recert completed -- advised that he can get meds filled after orange card is renewed on Friday.  Verbalized understanding and agreement.  Confirmed with Gershon Cull. Initial call taken by: Dutch Quint RN,  July 08, 2010 2:55 PM  Follow-up for Phone Call        will go ahead and send refills to pharmacy here so they will have but advise pt he will need to show the card when he picks them up Follow-up by: Lehman Prom FNP,  July 09, 2010 2:18 PM  Additional Follow-up for Phone Call Additional follow up Details #1::        Pt. notified. Gaylyn Cheers RN  July 09, 2010 3:45 PM     Prescriptions: AVALIDE 300-25 MG TABS (IRBESARTAN-HYDROCHLOROTHIAZIDE) 1 tablet by mouth daily for blood pressure  #30 x 5   Entered and Authorized by:   Lehman Prom FNP   Signed by:   Lehman Prom FNP on 07/09/2010   Method used:   Faxed to ...       [facility] (retail)       [address] [place], [place]  [address]       Ph: [phone]       Fax: [phone]   RxID:   [id number] NORVASC 10 MG TABS (AMLODIPINE BESYLATE) 1 tablet by mouth daily for blood pressure  #30 x 5   Entered and Authorized by:   Lehman Prom FNP   Signed by:   Lehman Prom FNP on 07/09/2010   Method used:   Faxed to ...       [facility] (retail)       [address] [place], [place]  [address]       Ph: [phone]       Fax: [phone]   RxID:   [id number] METFORMIN HCL 500 MG TABS (METFORMIN HCL) One tablet by mouth two times a day for diabetes  #60 x 5   Entered and Authorized by:   Lehman Prom FNP   Signed by:   Lehman Prom FNP on 07/09/2010   Method used:   Faxed to ...       [facility] (retail)       [address] [place], [place]  [address]       Ph: [phone]       Fax: [phone]   RxID:   [id number]

## 2010-07-28 LAB — DIFFERENTIAL
Basophils Absolute: 0 10*3/uL (ref 0.0–0.1)
Basophils Relative: 0 % (ref 0–1)
Eosinophils Absolute: 0.3 10*3/uL (ref 0.0–0.7)
Eosinophils Absolute: 0.4 10*3/uL (ref 0.0–0.7)
Eosinophils Relative: 4 % (ref 0–5)
Lymphs Abs: 2.3 10*3/uL (ref 0.7–4.0)
Monocytes Relative: 5 % (ref 3–12)
Monocytes Relative: 6 % (ref 3–12)
Neutro Abs: 4 10*3/uL (ref 1.7–7.7)
Neutrophils Relative %: 50 % (ref 43–77)

## 2010-07-28 LAB — COMPREHENSIVE METABOLIC PANEL
ALT: 32 U/L (ref 0–53)
Alkaline Phosphatase: 95 U/L (ref 39–117)
BUN: 14 mg/dL (ref 6–23)
CO2: 22 mEq/L (ref 19–32)
Chloride: 100 mEq/L (ref 96–112)
GFR calc non Af Amer: >60 mL/min (ref >60)
Glucose, Bld: 213 mg/dL — ABNORMAL HIGH (ref 70–99)
Potassium: 3.8 mEq/L (ref 3.5–5.1)
Sodium: 129 mEq/L — ABNORMAL LOW (ref 135–145)
Total Bilirubin: 0.7 mg/dL (ref 0.3–1.2)

## 2010-07-28 LAB — CBC
HCT: 41.7 % (ref 39.0–52.0)
HCT: 51.7 % (ref 39.0–52.0)
Hemoglobin: 14.4 g/dL (ref 13.0–17.0)
MCV: 82.7 fL (ref 78.0–100.0)
RBC: 5.03 MIL/uL (ref 4.22–5.81)
RBC: 6.25 MIL/uL — ABNORMAL HIGH (ref 4.22–5.81)
RDW: 11.9 % (ref 11.5–15.5)
WBC: 9.6 10*3/uL (ref 4.0–10.5)

## 2010-07-28 LAB — GLUCOSE, CAPILLARY
Glucose-Capillary: 124 mg/dL — ABNORMAL HIGH (ref 70–99)
Glucose-Capillary: 145 mg/dL — ABNORMAL HIGH (ref 70–99)
Glucose-Capillary: 270 mg/dL — ABNORMAL HIGH (ref 70–99)
Glucose-Capillary: 354 mg/dL — ABNORMAL HIGH (ref 70–99)

## 2010-07-28 LAB — HEMOGLOBIN A1C
Hgb A1c MFr Bld: 11.3 % — ABNORMAL HIGH (ref 4.6–6.1)
Mean Plasma Glucose: 278 mg/dL

## 2010-07-28 LAB — BASIC METABOLIC PANEL
Chloride: 92 mEq/L — ABNORMAL LOW (ref 96–112)
GFR calc Af Amer: >60 mL/min (ref >60)
Potassium: 4.1 mEq/L (ref 3.5–5.1)

## 2010-07-28 LAB — CREATININE, URINE, RANDOM: Creatinine, Urine: 163.5 mg/dL

## 2010-07-28 LAB — URINE MICROSCOPIC-ADD ON

## 2010-07-28 LAB — FERRITIN: Ferritin: 426 ng/mL — ABNORMAL HIGH (ref 22–322)

## 2010-07-28 LAB — URINALYSIS, ROUTINE W REFLEX MICROSCOPIC
Bilirubin Urine: NEGATIVE
Specific Gravity, Urine: 1.043 — ABNORMAL HIGH (ref 1.005–1.030)
pH: 5 (ref 5.0–8.0)

## 2010-08-25 NOTE — Discharge Summary (Signed)
Jimmy Parker, MACLIN NO.:  [id number]   MEDICAL RECORD NO.:  [id number]          PATIENT TYPE:  OBV   LOCATION:  1857                         FACILITY:  [facility]   PHYSICIAN:  Alvester Morin, M.D.  DATE OF BIRTH:  11-Dec-1970   DATE OF ADMISSION:  05/18/2008  DATE OF DISCHARGE:  05/19/2008                               DISCHARGE SUMMARY   DISCHARGE DIAGNOSES:  1. New-onset diabetes mellitus type 2 with hemoglobin A1c pending at      the time of discharge.  2. Hypertension.  3. History of right femur fracture status post surgery in 2003.  4. Hyperlipidemia with LDL of 120.   The patient was discharged home in stable condition and was instructed  to follow up with his primary care [name] at [facility] on  [facility] within 1 week of discharge.  If the patient is not able  to obtain a timely followup appointment at that location, he will be  tried to be set up with a clinic visit at the [facility].  During his followup visit, a basic metabolic panel should be  obtained and the patient checked for any electrolyte abnormalities as he  was recently started on lisinopril and metformin.  In addition, his  compliance to his new medication should be assessed and the patient  should be asked about any significant side effects stemming from these  medications.  He should also have his liver function tests evaluated  within 1 month of discharge as he was started on a statin therapy.   PROCEDURES PERFORMED:  None.   CONSULTATIONS:  None.   BRIEF ADMITTING HISTORY AND PHYSICAL:  Jimmy Parker is a 40 year old  African American male with a past medical history of hypertension who  presented to the emergency department with a chief complaint of having  to urinate up to about 10 times a day during the past 2 weeks and  approximately a same number of times during the night.  The patient  states that these symptoms started fairly abruptly in onset  and has not  had these in the past.  The patient states that he has also been  increasingly thirsty and has been wanting to drink water after every  time he urinates.  The patient denies any weight loss recently.  Denies  any hematuria, trouble starting or stopping the flow of urine, fevers,  chills, nausea, vomiting, diarrhea, abdominal pain, chest pain,  headache, and vision changes.  The patient admits to having a chronic  dry cough and some mild occasional dizziness but no syncope or loss of  consciousness.  The patient also denies any caffeine use.  However, he  does admit to some complaints of numbness and tingling like sensation in  bilateral feet on the lateral aspects.   PHYSICAL EXAMINATION:  VITAL SIGNS:  The patient had a blood pressure of  165/100, a heart rate of 125, temperature of 98.2, respiratory rate of  20.  GENERAL:  He was in no acute distress and was cooperative to exam.  HEENT:  Pupils equal,  round, and reactive to light, anicteric sclerae  and extraocular motions intact.  Oropharynx was clear without erythema  and the patient had mildly dry mucous membranes.  NECK:  Supple without any JVD or carotid bruits.  CARDIOVASCULAR:  Regular rate and rhythm with no murmurs, rubs or  gallops.  2+ peripheral pulses bilaterally.  RESPIRATORY:  Clear to auscultation bilaterally.  ABDOMEN:  Soft, nontender, nondistended and had normal bowel sounds.  EXTREMITY:  No clubbing, cyanosis or edema.  NEURO:  The patient to be alert and oriented x3.  Cranial nerves II-XII  were intact, 5/5 strength and normal sensation throughout and the  patient had intact finger-to-nose movements.   LABORATORY DATA:  On admission included a white blood cell count of 9.6,  hemoglobin of 17.9, MCV of 82.7, platelets of 287.  Sodium 130,  potassium 4.1, chloride 92, bicarb 26, BUN 15, creatinine 1.12, glucose  474, calcium 10.2.  Urinalysis was significant for greater than 1000  urine glucose, 15  ketones, negative blood, negative nitrites, and  negative leukocytes.   HOSPITAL COURSE:  1. New-onset diabetes type 2.  Because the patient had an initial      blood glucose greater than 500 and symptoms of polyuria and      polydipsia, he met the diagnostic criteria for diabetes mellitus      type 2.  The patient was initially ruled out to be in diabetic      ketoacidosis and HHS.  The patient was given a normal saline bolus      of 1 L and then rehydrated thereafter with IV infusion of normal      saline and was started on a Glucommander while in the emergency      department to bring his blood glucose levels down to a more      reasonable level.  The patient had a hemoglobin A1c that was drawn,      unfortunately, the results were pending by the time of discharge      along with a urine microalbumin to creatinine ratio.  He was      started on metformin and instructed to follow up with his primary      care Jimmy Parker within 1 week of discharge.  By the time of      discharge, his blood glucose level was under better control and the      patient was also set up with outpatient diabetic education consult.  2. Hypertension.  On admission, the patient had a blood pressure of      165/100 and this was thought most likely secondary to noncompliance      with his medications as he admitted not taking his medications for      the past 7 months as he had not been able to get them refilled.      While he was in the emergency department, the patient was given one      dose of Norvasc and his BP decreased to 118/77 and his heart rate      also decreased from 125 to 68.  Because of his diabetes, the      patient was felt to benefit from starting a low dose of lisinopril      upon discharge.  He will need to have his blood pressure rechecked      on his followup visit and a basic metabolic panel drawn to check      for electrolyte abnormalities.  3. Hyperlipidemia.  Review of his old records  showed that the patient      has had fasting lipid profiles in the recent past which show LDL      level of approximately 120.  Since this is not at goal for a      diabetic, it was decided that the patient would benefit from      addition of a statin therapy to his outpatient medication regimen.      He was notified that this along with all of the medications that he      was been prescribed on today's visit were obtainable through the      four-dollar list at either [employer] or [employer].  The patient will      need to have a repeat check of his LFT's and fasting lipid profile      during his followup visit.   DISCHARGE LABORATORY DATA AND VITALS:  On day of discharge, the patient  had a temperature of 98.3, blood pressure of 138/78, pulse of 88,  respiratory rate of 16, oxygen saturation of 100% on room air.  The  patient's  white blood cell count was 8.1, hemoglobin 14.4, platelet count 236, MCV  of 82.9, sodium 129, potassium 3.8, chloride of 100, bicarb 22, BUN 14,  creatinine 1.01, glucose of 213, total bilirubin 0.7, alk phos 95, AST  22, ALT 32, total protein 5.5, total albumin 3.1, and calcium 8.2.      Lucy Antigua, MD  Electronically Signed      Alvester Morin, M.D.  Electronically Signed    RK/MEDQ  D:  05/19/2008  T:  05/19/2008  Job:  [id number]   cc:   [facility]

## 2011-10-14 ENCOUNTER — Emergency Department (HOSPITAL_COMMUNITY)
Admission: EM | Admit: 2011-10-14 | Discharge: 2011-10-14 | Discharge status: Against Medical Advice | Payer: Self-pay | Attending: Emergency Medicine | Admitting: Emergency Medicine

## 2011-10-14 ENCOUNTER — Encounter (HOSPITAL_COMMUNITY): Payer: Self-pay | Admitting: Emergency Medicine

## 2011-10-14 DIAGNOSIS — H9209 Otalgia, unspecified ear: Secondary | ICD-10-CM | POA: Insufficient documentation

## 2011-10-14 HISTORY — DX: Essential (primary) hypertension: I10
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
## 2011-10-14 NOTE — ED Notes (Addendum)
Patient wanting to leave hospital AMA; patient states that he has waited in his room for over an hour and does not want to wait for the doctor any longer.  Patient encouraged to stay; patient refused and states that he would rather go to Urgent Care in the morning.  Informed patient that he might also have to wait tomorrow at Urgent Care and that he has already started the process here (in a room, waiting to see the provider), so that it is beneficial for him to stay.  Asked patient if he would like to speak to the charge nurse; patient refused several times.  Patient signed electronic AMA form before leaving.  Patient upset; rushed out of room after signing form.

## 2011-10-14 NOTE — ED Notes (Signed)
Patient complaining of a right ear ache that started two days ago.  Describes pain as "aching"; rates pain 10/10 on the numerical pain scale.  Patient also reporting nasal congestion and cold like symptoms.  Patient denies chest pain, shortness of breath, and dizziness.  Patient alert and oriented x4; PERRL present.  Will continue to monitor.

## 2012-01-29 ENCOUNTER — Emergency Department (HOSPITAL_COMMUNITY): Payer: Self-pay

## 2012-01-29 ENCOUNTER — Emergency Department (HOSPITAL_COMMUNITY): Payer: No Typology Code available for payment source

## 2012-01-29 ENCOUNTER — Encounter (HOSPITAL_COMMUNITY): Payer: Self-pay | Admitting: *Deleted

## 2012-01-29 ENCOUNTER — Emergency Department (HOSPITAL_COMMUNITY)
Admission: EM | Admit: 2012-01-29 | Discharge: 2012-01-29 | Disposition: A | Discharge status: Home / Self Care | Payer: No Typology Code available for payment source | Attending: Emergency Medicine | Admitting: Emergency Medicine

## 2012-01-29 DIAGNOSIS — R079 Chest pain, unspecified: Secondary | ICD-10-CM | POA: Insufficient documentation

## 2012-01-29 DIAGNOSIS — Y92410 Unspecified street and highway as the place of occurrence of the external cause: Secondary | ICD-10-CM | POA: Insufficient documentation

## 2012-01-29 DIAGNOSIS — E119 Type 2 diabetes mellitus without complications: Secondary | ICD-10-CM

## 2012-01-29 DIAGNOSIS — S27329A Contusion of lung, unspecified, initial encounter: Secondary | ICD-10-CM

## 2012-01-29 DIAGNOSIS — M25559 Pain in unspecified hip: Secondary | ICD-10-CM | POA: Insufficient documentation

## 2012-01-29 DIAGNOSIS — F10929 Alcohol use, unspecified with intoxication, unspecified: Secondary | ICD-10-CM

## 2012-01-29 DIAGNOSIS — Z23 Encounter for immunization: Secondary | ICD-10-CM | POA: Insufficient documentation

## 2012-01-29 DIAGNOSIS — S27322A Contusion of lung, bilateral, initial encounter: Secondary | ICD-10-CM

## 2012-01-29 DIAGNOSIS — S20219A Contusion of unspecified front wall of thorax, initial encounter: Secondary | ICD-10-CM

## 2012-01-29 DIAGNOSIS — S0001XA Abrasion of scalp, initial encounter: Secondary | ICD-10-CM

## 2012-01-29 DIAGNOSIS — I10 Essential (primary) hypertension: Secondary | ICD-10-CM | POA: Insufficient documentation

## 2012-01-29 LAB — COMPREHENSIVE METABOLIC PANEL
ALT: 49 U/L (ref 0–53)
Alkaline Phosphatase: 86 U/L (ref 39–117)
Chloride: 106 mEq/L (ref 96–112)
GFR calc Af Amer: >90 mL/min (ref >90)
Glucose, Bld: 133 mg/dL — ABNORMAL HIGH (ref 70–99)
Potassium: 3.5 mEq/L (ref 3.5–5.1)
Sodium: 142 mEq/L (ref 135–145)
Total Bilirubin: 0.2 mg/dL — ABNORMAL LOW (ref 0.3–1.2)
Total Protein: 6.7 g/dL (ref 6.0–8.3)

## 2012-01-29 LAB — POCT I-STAT, CHEM 8
BUN: 21 mg/dL (ref 6–23)
Chloride: 108 mEq/L (ref 96–112)
Glucose, Bld: 129 mg/dL — ABNORMAL HIGH (ref 70–99)
HCT: 51 % (ref 39.0–52.0)
Potassium: 3.6 mEq/L (ref 3.5–5.1)

## 2012-01-29 LAB — SAMPLE TO BLOOD BANK

## 2012-01-29 LAB — CBC
MCH: 29.3 pg (ref 26.0–34.0)
MCHC: 34.3 g/dL (ref 30.0–36.0)
MCV: 85.4 fL (ref 78.0–100.0)
Platelets: 200 10*3/uL (ref 150–400)
RBC: 5.7 MIL/uL (ref 4.22–5.81)

## 2012-01-29 LAB — URINALYSIS, ROUTINE W REFLEX MICROSCOPIC
Glucose, UA: NEGATIVE mg/dL
Hgb urine dipstick: NEGATIVE
Ketones, ur: NEGATIVE mg/dL
Protein, ur: NEGATIVE mg/dL

## 2012-01-29 LAB — RAPID URINE DRUG SCREEN, HOSP PERFORMED
Amphetamines: NOT DETECTED
Benzodiazepines: NOT DETECTED
Opiates: NOT DETECTED
Tetrahydrocannabinol: NOT DETECTED

## 2012-01-29 MED ORDER — SODIUM CHLORIDE 0.9 % IV BOLUS (SEPSIS)
1000.0000 mL | Freq: Once | INTRAVENOUS | Status: AC
  Administered 2012-01-29: 1000 mL via INTRAVENOUS

## 2012-01-29 MED ORDER — THIAMINE HCL 100 MG/ML IJ SOLN
100.0000 mg | Freq: Once | INTRAMUSCULAR | Status: AC
  Administered 2012-01-29: 100 mg via INTRAVENOUS
  Filled 2012-01-29: qty 2

## 2012-01-29 MED ORDER — IOHEXOL 300 MG/ML  SOLN
100.0000 mL | Freq: Once | INTRAMUSCULAR | Status: AC | PRN
  Administered 2012-01-29: 100 mL via INTRAVENOUS

## 2012-01-29 MED ORDER — TETANUS-DIPHTHERIA TOXOIDS TD 5-2 LFU IM INJ
0.5000 mL | INJECTION | Freq: Once | INTRAMUSCULAR | Status: AC
  Administered 2012-01-29: 0.5 mL via INTRAMUSCULAR
  Filled 2012-01-29: qty 0.5
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
## 2012-01-29 NOTE — ED Notes (Signed)
[employer] reading rights at [facility]
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
## 2012-01-29 NOTE — ED Notes (Signed)
Dr. Bebe Shaggy at the bedside speaking with the patient
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
## 2012-01-29 NOTE — ED Notes (Signed)
Chaplain at [facility], no changes, remains interactive.

## 2012-01-29 NOTE — ED Notes (Signed)
Pt semi-cooperative, not following some commands, acting playful, moving when asked not to, moving extremities during xrays, states, "i'm OK".

## 2012-01-31 ENCOUNTER — Emergency Department (INDEPENDENT_AMBULATORY_CARE_PROVIDER_SITE_OTHER)
Admission: EM | Admit: 2012-01-31 | Discharge: 2012-01-31 | Disposition: A | Discharge status: Home / Self Care | Payer: Self-pay | Source: Home / Self Care | Attending: Family Medicine | Admitting: Family Medicine

## 2012-01-31 ENCOUNTER — Encounter (HOSPITAL_COMMUNITY): Payer: Self-pay

## 2012-01-31 ENCOUNTER — Emergency Department (INDEPENDENT_AMBULATORY_CARE_PROVIDER_SITE_OTHER): Payer: Self-pay

## 2012-01-31 DIAGNOSIS — S2239XA Fracture of one rib, unspecified side, initial encounter for closed fracture: Secondary | ICD-10-CM

## 2012-01-31 MED ORDER — HYDROCODONE-ACETAMINOPHEN 5-325 MG PO TABS: 2.0000 | ORAL_TABLET | Freq: Three times a day (TID) | ORAL | Status: DC | PRN

## 2012-01-31 MED ORDER — HYDROCODONE-ACETAMINOPHEN 5-325 MG PO TABS: 2.0000 | ORAL_TABLET | ORAL | Status: DC | PRN

## 2012-01-31 MED ORDER — IBUPROFEN 600 MG PO TABS: 600.0000 mg | ORAL_TABLET | Freq: Three times a day (TID) | ORAL | Status: DC | PRN

## 2012-01-31 NOTE — ED Notes (Signed)
Instructed regarding use of rib splint (to wear firmly snug, but not tight enough to restrict breathing) to remove at intervals, to breathe deeply, and to acquire children's balloons to blow up , several times a day to reduce chance of pneumonia. Use care w pain medication to prevent constipation, and is to follow up with the trauma surgeon as scheduled
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
## 2012-02-01 ENCOUNTER — Telehealth (HOSPITAL_COMMUNITY): Payer: Self-pay | Admitting: Orthopedic Surgery
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
## 2012-02-02 NOTE — Telephone Encounter (Signed)
Left message

## 2012-02-03 ENCOUNTER — Telehealth (HOSPITAL_COMMUNITY): Payer: Self-pay | Admitting: Orthopedic Surgery

## 2012-02-04 NOTE — Telephone Encounter (Signed)
Left message

## 2012-02-15 ENCOUNTER — Telehealth (HOSPITAL_COMMUNITY): Payer: Self-pay | Admitting: Orthopedic Surgery

## 2012-02-15 NOTE — Telephone Encounter (Signed)
Patient stated he had already seen another doctor since discharge from the hospital. I told him that he did not need to follow up with me as well.

## 2012-02-17 ENCOUNTER — Telehealth (HOSPITAL_COMMUNITY): Payer: Self-pay | Admitting: General Surgery

## 2013-08-03 ENCOUNTER — Observation Stay (HOSPITAL_COMMUNITY)
Admission: EM | Admit: 2013-08-03 | Discharge: 2013-08-05 | Disposition: A | Discharge status: Home / Self Care | Payer: BC Managed Care – PPO | Attending: Internal Medicine | Admitting: Internal Medicine

## 2013-08-03 ENCOUNTER — Encounter (HOSPITAL_COMMUNITY): Payer: Self-pay | Admitting: Emergency Medicine

## 2013-08-03 ENCOUNTER — Emergency Department (HOSPITAL_COMMUNITY): Payer: BC Managed Care – PPO

## 2013-08-03 DIAGNOSIS — I517 Cardiomegaly: Secondary | ICD-10-CM | POA: Insufficient documentation

## 2013-08-03 DIAGNOSIS — E119 Type 2 diabetes mellitus without complications: Secondary | ICD-10-CM | POA: Insufficient documentation

## 2013-08-03 DIAGNOSIS — I10 Essential (primary) hypertension: Secondary | ICD-10-CM | POA: Insufficient documentation

## 2013-08-03 DIAGNOSIS — E8881 Metabolic syndrome: Secondary | ICD-10-CM

## 2013-08-03 DIAGNOSIS — F17200 Nicotine dependence, unspecified, uncomplicated: Secondary | ICD-10-CM | POA: Insufficient documentation

## 2013-08-03 DIAGNOSIS — I498 Other specified cardiac arrhythmias: Secondary | ICD-10-CM | POA: Insufficient documentation

## 2013-08-03 DIAGNOSIS — R079 Chest pain, unspecified: Secondary | ICD-10-CM

## 2013-08-03 DIAGNOSIS — Z79899 Other long term (current) drug therapy: Secondary | ICD-10-CM | POA: Insufficient documentation

## 2013-08-03 DIAGNOSIS — E785 Hyperlipidemia, unspecified: Secondary | ICD-10-CM | POA: Insufficient documentation

## 2013-08-03 DIAGNOSIS — R9431 Abnormal electrocardiogram [ECG] [EKG]: Secondary | ICD-10-CM | POA: Insufficient documentation

## 2013-08-03 DIAGNOSIS — Z8249 Family history of ischemic heart disease and other diseases of the circulatory system: Secondary | ICD-10-CM | POA: Insufficient documentation

## 2013-08-03 HISTORY — DX: Chest pain, unspecified: R07.9

## 2013-08-03 LAB — CBC WITH DIFFERENTIAL/PLATELET
Basophils Absolute: 0 10*3/uL (ref 0.0–0.1)
Basophils Relative: 0 % (ref 0–1)
Eosinophils Absolute: 0.2 10*3/uL (ref 0.0–0.7)
Eosinophils Relative: 3 % (ref 0–5)
HCT: 50.1 % (ref 39.0–52.0)
HEMOGLOBIN: 17 g/dL (ref 13.0–17.0)
LYMPHS ABS: 2.7 10*3/uL (ref 0.7–4.0)
LYMPHS PCT: 30 % (ref 12–46)
MCH: 28.4 pg (ref 26.0–34.0)
MCHC: 33.9 g/dL (ref 30.0–36.0)
MCV: 83.8 fL (ref 78.0–100.0)
MONOS PCT: 6 % (ref 3–12)
Monocytes Absolute: 0.5 10*3/uL (ref 0.1–1.0)
NEUTROS ABS: 5.5 10*3/uL (ref 1.7–7.7)
NEUTROS PCT: 61 % (ref 43–77)
PLATELETS: 241 10*3/uL (ref 150–400)
RBC: 5.98 MIL/uL — AB (ref 4.22–5.81)
RDW: 12.9 % (ref 11.5–15.5)
WBC: 8.9 10*3/uL (ref 4.0–10.5)

## 2013-08-03 LAB — CREATININE, SERUM
CREATININE: 0.98 mg/dL (ref 0.50–1.35)
GFR calc Af Amer: >90 mL/min (ref >90)

## 2013-08-03 LAB — HEPATIC FUNCTION PANEL
ALT: 21 U/L (ref 0–53)
AST: 20 U/L (ref 0–37)
Albumin: 4.2 g/dL (ref 3.5–5.2)
Alkaline Phosphatase: 127 U/L — ABNORMAL HIGH (ref 39–117)
BILIRUBIN TOTAL: 0.6 mg/dL (ref 0.3–1.2)
Total Protein: 7.3 g/dL (ref 6.0–8.3)

## 2013-08-03 LAB — I-STAT TROPONIN, ED: TROPONIN I, POC: 0 ng/mL (ref 0.00–0.08)

## 2013-08-03 LAB — I-STAT CHEM 8, ED
BUN: 7 mg/dL (ref 6–23)
CALCIUM ION: 1.19 mmol/L (ref 1.12–1.23)
CHLORIDE: 102 meq/L (ref 96–112)
Creatinine, Ser: 1.1 mg/dL (ref 0.50–1.35)
Glucose, Bld: 101 mg/dL — ABNORMAL HIGH (ref 70–99)
HEMATOCRIT: 55 % — AB (ref 39.0–52.0)
Hemoglobin: 18.7 g/dL — ABNORMAL HIGH (ref 13.0–17.0)
Potassium: 3.5 mEq/L — ABNORMAL LOW (ref 3.7–5.3)
SODIUM: 140 meq/L (ref 137–147)
TCO2: 25 mmol/L (ref 0–100)

## 2013-08-03 LAB — MAGNESIUM: Magnesium: 1.9 mg/dL (ref 1.5–2.5)

## 2013-08-03 LAB — PROTIME-INR
INR: 0.97 (ref 0.00–1.49)
PROTHROMBIN TIME: 12.7 s (ref 11.6–15.2)

## 2013-08-03 LAB — D-DIMER, QUANTITATIVE: D-Dimer, Quant: <0.27 ug/mL-FEU (ref 0.00–0.48)

## 2013-08-03 LAB — HEMOGLOBIN A1C
Hgb A1c MFr Bld: 6.3 % — ABNORMAL HIGH (ref <5.7)
MEAN PLASMA GLUCOSE: 134 mg/dL — AB (ref <117)

## 2013-08-03 LAB — TROPONIN I: Troponin I: <0.3 ng/mL (ref <0.30)

## 2013-08-03 LAB — APTT: APTT: 28 s (ref 24–37)

## 2013-08-03 MED ORDER — INSULIN ASPART 100 UNIT/ML ~~LOC~~ SOLN: 0.0000 [IU] | Freq: Three times a day (TID) | SUBCUTANEOUS | Status: DC

## 2013-08-03 MED ORDER — POTASSIUM CHLORIDE CRYS ER 20 MEQ PO TBCR
40.0000 meq | EXTENDED_RELEASE_TABLET | Freq: Once | ORAL | Status: AC
  Administered 2013-08-03: 40 meq via ORAL
  Filled 2013-08-03: qty 2

## 2013-08-03 MED ORDER — NICOTINE 21 MG/24HR TD PT24
21.0000 mg | MEDICATED_PATCH | Freq: Every day | TRANSDERMAL | Status: DC
  Administered 2013-08-03 – 2013-08-05 (×3): 21 mg via TRANSDERMAL
  Filled 2013-08-03 (×3): qty 1

## 2013-08-03 MED ORDER — ASPIRIN EC 81 MG PO TBEC
81.0000 mg | DELAYED_RELEASE_TABLET | Freq: Every day | ORAL | Status: DC
  Administered 2013-08-04 – 2013-08-05 (×2): 81 mg via ORAL
  Filled 2013-08-03 (×2): qty 1

## 2013-08-03 MED ORDER — ENOXAPARIN SODIUM 40 MG/0.4ML ~~LOC~~ SOLN
40.0000 mg | SUBCUTANEOUS | Status: DC
  Administered 2013-08-03 – 2013-08-04 (×2): 40 mg via SUBCUTANEOUS
  Filled 2013-08-03 (×3): qty 0.4

## 2013-08-03 MED ORDER — SODIUM CHLORIDE 0.9 % IV SOLN
INTRAVENOUS | Status: AC
  Administered 2013-08-03: 18:00:00 via INTRAVENOUS

## 2013-08-03 MED ORDER — ATORVASTATIN CALCIUM 20 MG PO TABS
20.0000 mg | ORAL_TABLET | Freq: Every day | ORAL | Status: DC
  Filled 2013-08-03 (×2): qty 1

## 2013-08-03 MED ORDER — ACETAMINOPHEN 325 MG PO TABS
650.0000 mg | ORAL_TABLET | ORAL | Status: DC | PRN
  Administered 2013-08-04: 650 mg via ORAL
  Filled 2013-08-03: qty 2

## 2013-08-03 MED ORDER — ONDANSETRON HCL 4 MG/2ML IJ SOLN: 4.0000 mg | Freq: Four times a day (QID) | INTRAMUSCULAR | Status: DC | PRN

## 2013-08-03 MED ORDER — HYDROCHLOROTHIAZIDE 25 MG PO TABS
25.0000 mg | ORAL_TABLET | Freq: Every day | ORAL | Status: DC
  Administered 2013-08-03 – 2013-08-05 (×3): 25 mg via ORAL
  Filled 2013-08-03 (×4): qty 1

## 2013-08-03 MED ORDER — AMLODIPINE BESYLATE 10 MG PO TABS
10.0000 mg | ORAL_TABLET | Freq: Every day | ORAL | Status: DC
  Administered 2013-08-03: 10 mg via ORAL
  Filled 2013-08-03 (×3): qty 1

## 2013-08-03 MED ORDER — GI COCKTAIL ~~LOC~~
30.0000 mL | Freq: Three times a day (TID) | ORAL | Status: DC | PRN
  Filled 2013-08-03: qty 30

## 2013-08-03 MED ORDER — NITROGLYCERIN 0.4 MG SL SUBL: 0.4000 mg | SUBLINGUAL_TABLET | SUBLINGUAL | Status: DC | PRN

## 2013-08-03 MED ORDER — PANTOPRAZOLE SODIUM 40 MG PO TBEC
40.0000 mg | DELAYED_RELEASE_TABLET | Freq: Every day | ORAL | Status: DC
Start: 2013-08-04 — End: 2013-08-05
  Administered 2013-08-05: 40 mg via ORAL
  Filled 2013-08-03 (×2): qty 1

## 2013-08-03 MED ORDER — ASPIRIN 81 MG PO CHEW
324.0000 mg | CHEWABLE_TABLET | Freq: Once | ORAL | Status: AC
  Administered 2013-08-03: 324 mg via ORAL
  Filled 2013-08-03: qty 4

## 2013-08-03 NOTE — H&P (Signed)
[facility] History and Physical  Jimmy Savoyony L Ertel ZOX:[id number] DOB: 09/13/1970 DOA: 08/03/2013  Referring physician: Dr Anitra LauthPlunkett PCP: No primary provider on file.   Chief Complaint: Chest pain  HPI: Jimmy Parker is a 43 y.o. 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 Patient stated her chest pain lasted about 30 minutes and resolved on its own. Patient does endorse right upper extremity pain and soreness which was intermittent at that time as well as diaphoresis, nausea, dizziness. Patient denies any palpitations, no paroxysmal nocturnal dyspnea, no orthopnea, no fever, no chills, no lower extremity edema, no diarrhea, no constipation, no dysuria. Patient does endorse a chronic cough which is unchanged. Patient also endorses generalized weakness. Patient denies any hemoptysis. Patient denies any recent long car rides. Patient denies any recent surgeries. Patient was seen in the emergency room basic metabolic profile obtained at a potassium of 3.5 otherwise was within normal limits. CBC had a hemoglobin of 17.0 otherwise was within normal limits. Chest x-ray was unremarkable. EKG showed a sinus tachycardia with a rate of 101. Patient was given an aspirin. We were called to admit the patient for further evaluation and management.   Review of Systems: As per history of present illness otherwise negative. Constitutional:  No weight loss, night sweats, Fevers, chills, fatigue.  HEENT:  No headaches, Difficulty swallowing,Tooth/dental problems,Sore throat,  No sneezing, itching, ear ache,  nasal congestion, post nasal drip,  Cardio-vascular:  No chest pain, Orthopnea, PND, swelling in lower extremities, anasarca, dizziness, palpitations  GI:  No heartburn, indigestion, abdominal pain, nausea, vomiting, diarrhea, change in bowel habits, loss of appetite  Resp:  No shortness of breath with exertion or at rest. No excess mucus, no productive cough, No non-productive cough, No coughing up of blood.No change in color of mucus.No wheezing.No chest wall deformity  Skin:  no rash or lesions.  GU:  no dysuria, change in color of urine, no urgency or frequency. No flank pain.  Musculoskeletal:  No joint pain or swelling. No decreased range of motion. No back pain.  Psych:  No change in mood or affect. No depression or anxiety. No memory loss.   Past Medical History  Diagnosis Date  . Hypertension   . Diabetes mellitus   . Poor historian    Past Surgical History  Procedure Laterality Date  . Shoulder surgery      Right: Patient denies any hx of shoulder surgery  . Fracture surgery      femur and hip   Social History:  reports that he has been smoking.  He does not have any smokeless tobacco history on file. He reports that he drinks alcohol. He reports that he does not use illicit drugs.  No Known Allergies  History reviewed. No pertinent family history.   Prior to Admission medications   Medication Sig Start Date End Date Taking? Authorizing Provider  amLODipine (NORVASC) 10 MG tablet Take 10 mg by mouth daily.   Yes Historical Provider, MD  hydrochlorothiazide (HYDRODIURIL) 25 MG tablet Take 25 mg by mouth daily.   Yes Historical Provider, MD  tetrahydrozoline (VISINE) 0.05 % ophthalmic solution Place 2 drops into  both eyes daily as needed (for dry eyes).   Yes Historical Provider, MD   Physical Exam: Filed Vitals:   08/03/13 1404  BP: 165/94  Pulse: 102  Temp: 99 F (37.2 C)  Resp: 15    BP 165/94  Pulse 102  Temp(Src) 99 F (37.2 C) (Oral)  Resp 15   SpO2 98%  General:  Appears calm and comfortable, in no acute cardiopulmonary distress. Eyes: PERRLA, EOMI, normal lids, irises & conjunctiva ENT: grossly normal hearing, lips & tongue Neck: no LAD, masses or thyromegaly Cardiovascular: RRR, no m/r/g. No LE edema. Chest wall nontender to palpation Telemetry: Sinus tachycardia. Respiratory: CTA bilaterally, no w/r/r. Normal respiratory effort. Abdomen: soft, ntnd, positive bowel sounds, no rebound, no guarding. Skin: no rash or induration seen on limited exam Musculoskeletal: grossly normal tone BUE/BLE. Chest wall nontender to palpation Psychiatric: grossly normal mood and affect, speech fluent and appropriate Neurologic:  Alert and oriented x3. Cranial nerves II through XII are grossly intact. No focal deficits.           Labs on Admission:  Basic Metabolic Panel:  Recent Labs Lab 08/03/13 1443  NA 140  K 3.5*  CL 102  GLUCOSE 101*  BUN 7  CREATININE 1.10   Liver Function Tests: No results found for this basename: AST, ALT, ALKPHOS, BILITOT, PROT, ALBUMIN,  in the last 168 hours No results found for this basename: LIPASE, AMYLASE,  in the last 168 hours No results found for this basename: AMMONIA,  in the last 168 hours CBC:  Recent Labs Lab 08/03/13 1430 08/03/13 1443  WBC 8.9  --   NEUTROABS 5.5  --   HGB 17.0 18.7*  HCT 50.1 55.0*  MCV 83.8  --   PLT 241  --    Cardiac Enzymes: No results found for this basename: CKTOTAL, CKMB, CKMBINDEX, TROPONINI,  in the last 168 hours  BNP (last 3 results) No results found for this basename: PROBNP,  in the last 8760 hours CBG: No results found for this basename: GLUCAP,  in the last 168 hours  Radiological Exams on Admission: Dg Chest 2 View  08/03/2013   CLINICAL DATA:  Chest pain, cough, shortness of Breath  EXAM: CHEST  2 VIEW  COMPARISON:  01/31/2012  FINDINGS: Cardiomediastinal silhouette is stable. No acute infiltrate or pleural effusion. No pulmonary edema.  Bony thorax is unremarkable.  IMPRESSION: No active cardiopulmonary disease.   Electronically Signed   By: Natasha MeadLiviu  Pop M.D.   On: 08/03/2013 14:33    EKG: Independently reviewed. Sinus tachycardia  Assessment/Plan Principal Problem:   Chest pain Active Problems:   DIABETES MELLITUS   HYPERLIPIDEMIA   TOBACCO ABUSE   HYPERTENSION, BENIGN ESSENTIAL   #1 chest pain Questionable etiology. Patient with atypical features with midsternal to right-sided chest pain which he describes as a tightness and tug. Patient also states had some right upper extremity pain with some diaphoresis and nausea. Patient does have multiple risk factors of hypertension, diabetes, questionable hyperlipidemia, family history of coronary artery disease. Patient is currently chest pain-free. Will admit the patient to telemetry. Cycle cardiac enzymes every 6 hours x3. Check a 2-D echo. Check a fasting lipid panel. Check a d-dimer. Will place on aspirin, oxygen, nitroglycerin as needed, PPI, Lipitor, Norvasc and HCTZ. Follow. If cardiac enzymes are abnormal or 2-D echo is abnormal will need a cardiology consultation.  #2 diabetes mellitus Patient stated he was diagnosed with diabetes mellitus and was on medications however this was discontinued  and was told his diabetes was nondiagnostic. Will check a hemoglobin A1c. Will place on a sliding scale insulin. Follow.  #3 questionable history of hyperlipidemia Patient denies any history of hyperlipidemia. Will check a fasting lipid panel. Will start patient on low-dose Lipitor 20 mg daily pending fasting lipid panel.  #4 tobacco abuse Tobacco cessation. We'll place on a nicotine patch.  #5 hypertension Patient did state was supposed to be on Norvasc and HCTZ however has not taken any antihypertensive medications for approximately 2 years. Will resume patient back on Norvasc 10 mg daily as well as HCTZ 25 mg daily. Follow.   #6 prophylaxis PPI for GI prophylaxis. Lovenox for  DVT prophylaxis.    Code Status: Full Family Communication: Updated patient at bedside no family present. Disposition Plan: Admit to telemetry under observation  Time spent: 60 minutes  Rodolph Bong MD [facility] Pager [phone]

## 2013-08-03 NOTE — ED Provider Notes (Signed)
CSN: [id number]     Arrival date & time 08/03/13  1354 History   First MD Initiated Contact with Patient 08/03/13 1409     Chief Complaint  Patient presents with  . Chest Pain     (Consider location/radiation/quality/duration/timing/severity/associated sxs/prior Treatment) Patient is a 43 y.o. male presenting with chest pain. The history is provided by the patient.  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  denies recent meal but states he has been coughing a lot but not significant change from baseline Relieved by:  Nothing Worsened by:  Nothing tried (pain not worsened by deep breathing or coughing) Ineffective treatments:  None tried Associated symptoms: cough and shortness of breath   Associated symptoms: no abdominal pain, no fever, no lower extremity edema, no nausea, no syncope, not vomiting and no weakness   Associated symptoms comment:  Lightheadedness during event Risk factors: diabetes mellitus, hypertension, male sex and smoking   Risk factors comment:  No family hx of MI or clotting disorders   Past Medical History  Diagnosis Date  . Hypertension   . Diabetes mellitus   . Poor historian    Past Surgical History  Procedure Laterality Date  . Shoulder surgery      Right   History reviewed. No pertinent family history. History  Substance Use Topics  . Smoking status: Current Every Day Smoker  . Smokeless tobacco: Not on file  . Alcohol Use: Yes    Review of Systems  Constitutional: Negative for fever.  Respiratory: Positive for cough and shortness of breath.   Cardiovascular: Positive for chest pain. Negative for syncope.  Gastrointestinal: Negative for  nausea, vomiting and abdominal pain.  Neurological: Positive for light-headedness. Negative for weakness.  All other systems reviewed and are negative.     Allergies  Review of patient's allergies indicates no known allergies.  Home Medications   Prior to Admission medications   Medication Sig Start Date End Date Taking? Authorizing Provider  HYDROcodone-acetaminophen (NORCO/VICODIN) 5-325 MG per tablet Take 2 tablets by mouth every 8 (eight) hours as needed for pain. 01/31/12   Adlih Moreno-Coll, MD  ibuprofen (ADVIL,MOTRIN) 600 MG tablet Take 1 tablet (600 mg total) by mouth every 8 (eight) hours as needed for pain. 01/31/12   Adlih Moreno-Coll, MD   BP 165/94  Pulse 102  Temp(Src) 99 F (37.2 C) (Oral)  Resp 15  SpO2 98% Physical Exam  Nursing note and vitals reviewed. Constitutional: He is oriented to person, place, and time. He appears well-developed and well-nourished. No distress.  HENT:  Head: Normocephalic and atraumatic.  Mouth/Throat: Oropharynx is clear and moist.  Eyes: Conjunctivae and EOM are normal. Pupils are equal, round, and reactive to light.  Neck: Normal range of motion. Neck supple.  Cardiovascular: Normal rate, regular rhythm and intact distal pulses.   No murmur heard. Pulmonary/Chest: Effort normal and breath sounds normal. No respiratory distress. He has no wheezes. He has no rales. He exhibits no tenderness.  Abdominal: Soft. He exhibits no distension. There is no tenderness. There is no rebound and no guarding.  Musculoskeletal: Normal range of motion. He exhibits no edema and no tenderness.  No calf pain or swelling  Neurological: He is alert and oriented to person, place, and time.  Skin: Skin is warm and dry. No rash noted. No erythema.  Psychiatric: He has a normal mood and affect. His behavior is normal.    ED Course  Procedures (including critical care time) Labs Review Labs Reviewed  CBC WITH DIFFERENTIAL - Abnormal; Notable for the  following:    RBC 5.98 (*)    All other components within normal limits  I-STAT CHEM 8, ED - Abnormal; Notable for the following:    Potassium 3.5 (*)    Glucose, Bld 101 (*)    Hemoglobin 18.7 (*)    HCT 55.0 (*)    All other components within normal limits  I-STAT TROPOININ, ED    Imaging Review Dg Chest 2 View  08/03/2013   CLINICAL DATA:  Chest pain, cough, shortness of Breath  EXAM: CHEST  2 VIEW  COMPARISON:  01/31/2012  FINDINGS: Cardiomediastinal silhouette is stable. No acute infiltrate or pleural effusion. No pulmonary edema. Bony thorax is unremarkable.  IMPRESSION: No active cardiopulmonary disease.   Electronically Signed   By: Natasha MeadLiviu  Pop M.D.   On: 08/03/2013 14:33     EKG Interpretation   Date/Time:  Friday August 03 2013 14:00:50 EDT Ventricular Rate:  101 PR Interval:  134 QRS Duration: 82 QT Interval:  344 QTC Calculation: 446 R Axis:     Text Interpretation:  Sinus tachycardia Consider left ventricular  hypertrophy No significant change since last tracing Confirmed by Anitra LauthPLUNKETT   MD, Alphonzo LemmingsWHITNEY ([id number]) on 08/03/2013 2:20:47 PM      MDM   Final diagnoses:  Chest pain    Pt with symptoms concerning for ACS that started today at 1pm and resolved in appx 30min.  Heart score of 4 making pt moderate risk.  Associated symptoms include SOB, lightheadedness and right arm pain.  Low suspicion for PE based on hx and nature of pain.  ASA given. NTG held as pt currently is pain free. EKG with sinus tachy but unchanged from prior. CXR, CBC, BMP, CE, Coags pending.  3:00 PM Labs all wnl.  CXR wnl.  Given pt's risk factors and story feel he needs r/o.  Gwyneth SproutWhitney Mattison Golay, MD 08/03/13 1530

## 2013-08-03 NOTE — ED Notes (Signed)
Pt states he began to have chest pain that started at 1300 today. Pt states he feels sob, RR 15. Denies dizziness. Has decreased from 10/10 to 6/10. Hx of smoking, htn and dm. No cardiac hx.

## 2013-08-04 DIAGNOSIS — I517 Cardiomegaly: Secondary | ICD-10-CM

## 2013-08-04 DIAGNOSIS — F17200 Nicotine dependence, unspecified, uncomplicated: Secondary | ICD-10-CM

## 2013-08-04 LAB — TROPONIN I

## 2013-08-04 LAB — GLUCOSE, CAPILLARY
GLUCOSE-CAPILLARY: 155 mg/dL — AB (ref 70–99)
GLUCOSE-CAPILLARY: 118 mg/dL — AB (ref 70–99)
GLUCOSE-CAPILLARY: 146 mg/dL — AB (ref 70–99)
Glucose-Capillary: 128 mg/dL — ABNORMAL HIGH (ref 70–99)
Glucose-Capillary: 103 mg/dL — ABNORMAL HIGH (ref 70–99)

## 2013-08-04 LAB — BASIC METABOLIC PANEL
BUN: 12 mg/dL (ref 6–23)
CO2: 25 meq/L (ref 19–32)
Calcium: 9.7 mg/dL (ref 8.4–10.5)
Chloride: 99 mEq/L (ref 96–112)
Creatinine, Ser: 0.92 mg/dL (ref 0.50–1.35)
GFR calc non Af Amer: >90 mL/min (ref >90)
GLUCOSE: 119 mg/dL — AB (ref 70–99)
POTASSIUM: 4 meq/L (ref 3.7–5.3)
Sodium: 137 mEq/L (ref 137–147)

## 2013-08-04 LAB — CBC
HEMATOCRIT: 50.4 % (ref 39.0–52.0)
HEMOGLOBIN: 16.6 g/dL (ref 13.0–17.0)
MCH: 28.3 pg (ref 26.0–34.0)
MCHC: 32.9 g/dL (ref 30.0–36.0)
MCV: 86 fL (ref 78.0–100.0)
Platelets: 210 10*3/uL (ref 150–400)
RBC: 5.86 MIL/uL — AB (ref 4.22–5.81)
RDW: 12.9 % (ref 11.5–15.5)
WBC: 8.6 10*3/uL (ref 4.0–10.5)

## 2013-08-04 LAB — PROTIME-INR
INR: 0.9 (ref 0.00–1.49)
Prothrombin Time: 12 seconds (ref 11.6–15.2)

## 2013-08-04 LAB — LIPID PANEL
Cholesterol: 183 mg/dL (ref 0–200)
HDL: 30 mg/dL — ABNORMAL LOW (ref >39)
LDL CALC: 125 mg/dL — AB (ref 0–99)
Total CHOL/HDL Ratio: 6.1 RATIO
Triglycerides: 141 mg/dL (ref <150)
VLDL: 28 mg/dL (ref 0–40)

## 2013-08-04 MED ORDER — METOPROLOL SUCCINATE ER 25 MG PO TB24
25.0000 mg | ORAL_TABLET | Freq: Every day | ORAL | Status: DC
  Administered 2013-08-04 – 2013-08-05 (×2): 25 mg via ORAL
  Filled 2013-08-04 (×2): qty 1

## 2013-08-04 MED ORDER — PNEUMOCOCCAL VAC POLYVALENT 25 MCG/0.5ML IJ INJ
0.5000 mL | INJECTION | Freq: Once | INTRAMUSCULAR | Status: AC
  Administered 2013-08-04: 0.5 mL via INTRAMUSCULAR
  Filled 2013-08-04: qty 0.5

## 2013-08-04 MED ORDER — LISINOPRIL 5 MG PO TABS
5.0000 mg | ORAL_TABLET | Freq: Every day | ORAL | Status: DC
  Administered 2013-08-04 – 2013-08-05 (×2): 5 mg via ORAL
  Filled 2013-08-04 (×2): qty 1

## 2013-08-04 MED ORDER — ATORVASTATIN CALCIUM 40 MG PO TABS
40.0000 mg | ORAL_TABLET | Freq: Every day | ORAL | Status: DC
  Administered 2013-08-04: 40 mg via ORAL
  Filled 2013-08-04 (×2): qty 1

## 2013-08-04 MED ORDER — AMLODIPINE BESYLATE 10 MG PO TABS
10.0000 mg | ORAL_TABLET | Freq: Every day | ORAL | Status: DC
  Administered 2013-08-04 – 2013-08-05 (×2): 10 mg via ORAL
  Filled 2013-08-04 (×2): qty 1

## 2013-08-04 MED ORDER — SODIUM CHLORIDE 0.9 % IV SOLN
INTRAVENOUS | Status: DC
  Administered 2013-08-04: 21:00:00 via INTRAVENOUS

## 2013-08-04 NOTE — Progress Notes (Signed)
[facility] PROGRESS NOTE  Jimmy Parker [id number] DOB: 12/12/1970 DOA: 08/03/2013 PCP: No primary provider on file.  Assessment/Plan: 1. Atypical chest pain with some typical features -EKG with LVH, cardiac enzymes negative -has multiple cardiac risk factors, DM, HTN, Smoking, family h/o likely premature CAD, requested cardiology consult -FU lipids  2. DM -diet controlled, hbaic 6.3 -DC SSI  3. HTN -continue norvasc and HCTZ  4. Tobacco use -counseled  DVT proph: lovenox  Code Status: Full Code Family Communication: none at bedside Disposition Plan: home pending cards eval   Consultants:  Cards  HPI/Subjective: Feels ok, no further chest pain  Objective: Filed Vitals:   08/04/13 0550  BP: 158/83  Pulse: 99  Temp:   Resp:     Intake/Output Summary (Last 24 hours) at 08/04/13 0827 Last data filed at 08/04/13 0725  Gross per 24 hour  Intake 1362.5 ml  Output      0 ml  Net 1362.5 ml   Filed Weights   08/03/13 1723 08/04/13 0549  Weight: 80.786 kg (178 lb 1.6 oz) 80.151 kg (176 lb 11.2 oz)    Exam:   General:  AAOx3  Cardiovascular: S1S2/RRR  Respiratory: CTAB  Abdomen: soft, NT, BS present  Musculoskeletal: no edema c/c   Data Reviewed: Basic Metabolic Panel:  Recent Labs Lab 08/03/13 1443 08/03/13 1652 08/04/13 0420  NA 140  --  137  K 3.5*  --  4.0  CL 102  --  99  CO2  --   --  25  GLUCOSE 101*  --  119*  BUN 7  --  12  CREATININE 1.10 0.98 0.92  CALCIUM  --   --  9.7  MG  --  1.9  --    Liver Function Tests:  Recent Labs Lab 08/03/13 1652  AST 20  ALT 21  ALKPHOS 127*  BILITOT 0.6  PROT 7.3  ALBUMIN 4.2   No results found for this basename: LIPASE, AMYLASE,  in the last 168 hours No results found for this basename: AMMONIA,  in the last 168 hours CBC:  Recent Labs Lab 08/03/13 1430 08/03/13 1443 08/04/13 0420  WBC 8.9  --  8.6  NEUTROABS 5.5  --   --   HGB 17.0 18.7* 16.6  HCT 50.1 55.0* 50.4   MCV 83.8  --  86.0  PLT 241  --  210   Cardiac Enzymes:  Recent Labs Lab 08/03/13 1652 08/03/13 2158 08/04/13 0420  TROPONINI <0.30 <0.30 <0.30   BNP (last 3 results) No results found for this basename: PROBNP,  in the last 8760 hours CBG:  Recent Labs Lab 08/03/13 1730 08/03/13 2138 08/04/13 0617  GLUCAP 155* 118* 128*    No results found for this or any previous visit (from the past 240 hour(s)).   Studies: Dg Chest 2 View  08/03/2013   CLINICAL DATA:  Chest pain, cough, shortness of Breath  EXAM: CHEST  2 VIEW  COMPARISON:  01/31/2012  FINDINGS: Cardiomediastinal silhouette is stable. No acute infiltrate or pleural effusion. No pulmonary edema. Bony thorax is unremarkable.  IMPRESSION: No active cardiopulmonary disease.   Electronically Signed   By: Natasha MeadLiviu  Pop M.D.   On: 08/03/2013 14:33    Scheduled Meds: . amLODipine  10 mg Oral Daily  . aspirin EC  81 mg Oral Daily  . atorvastatin  20 mg Oral q1800  . enoxaparin (LOVENOX) injection  40 mg Subcutaneous Q24H  . hydrochlorothiazide  25 mg Oral Daily  .  insulin aspart  0-15 Units Subcutaneous TID WC  . nicotine  21 mg Transdermal Daily  . pantoprazole  40 mg Oral Q0600  . pneumococcal 23 valent vaccine  0.5 mL Intramuscular Once   Continuous Infusions: . sodium chloride 75 mL/hr at 08/03/13 1735   Antibiotics Given (last 72 hours)   None      Principal Problem:   Chest pain Active Problems:   DIABETES MELLITUS   HYPERLIPIDEMIA   TOBACCO ABUSE   HYPERTENSION, BENIGN ESSENTIAL    Time spent: 30min    Zannie CovePreetha Dava Rensch  [facility] Pager [phone]. If 7PM-7AM, please contact night-coverage at [email or web address], password [email or web address] 08/04/2013, 8:27 AM  LOS: 1 day

## 2013-08-04 NOTE — Progress Notes (Signed)
Echocardiogram 2D Echocardiogram has been performed.  Estelle GrumblesMelissa J Ahman Dugdale 08/04/2013, 3:47 PM

## 2013-08-04 NOTE — Progress Notes (Signed)
BP elevated this am and pt c/o headache. 167/105 in R arm and 158/83 in L arm. On call notified and will pass along to day shift. Gifford ShaveSara K Blayke Cordrey, RN

## 2013-08-04 NOTE — Consult Note (Signed)
CARDIOLOGY CONSULT NOTE   Patient ID: Jimmy Parker MRN: [id number], DOB/AGE: 45/10/1970   Admit date: 08/03/2013 Date of Consult: 08/04/2013  Primary Physician: No primary provider on file. Primary Cardiologist: Lars MassonKatarina H Rubens [place]  Reason for consult:  Chest pain  Problem List  Past Medical History  Diagnosis Date  . Hypertension   . Diabetes mellitus   . Poor historian     Past Surgical History  Procedure Laterality Date  . Shoulder surgery      Right: Patient denies any hx of shoulder surgery  . 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 Patient stated her chest pain lasted about 30 minutes and resolved on its own.  He denies any palpitations, no paroxysmal nocturnal dyspnea, no orthopnea, no fever, no chills, no lower extremity edema, no diarrhea, no constipation, no dysuria.  He states that this was a first episode of chest pain in the last 10 years. He has noticed a worsening exertional SOB while walking upstairs.   Inpatient Medications  . amLODipine  10 mg Oral Daily  . aspirin EC  81 mg Oral Daily  . atorvastatin  20 mg Oral q1800  . enoxaparin (LOVENOX) injection  40 mg Subcutaneous Q24H  . hydrochlorothiazide  25 mg Oral Daily  . nicotine  21 mg Transdermal Daily  . pantoprazole  40 mg Oral Q0600  . pneumococcal 23 valent vaccine  0.5 mL Intramuscular Once   Family History History reviewed. No pertinent family history.   Social History History   Social History  . Marital Status: Divorced    Spouse Name: N/A   Number of Children: N/A  . Years of Education: N/A   Occupational History  . Not on file.   Social History Main Topics  . Smoking status: Current Every Day Smoker  . Smokeless tobacco: Not on file  . Alcohol Use: Yes  . Drug Use: No  . Sexual Activity: Not on file   Other Topics Concern  . Not on file   Social History Narrative  . No narrative on file    Review of Systems  General:  No chills, fever, night sweats or weight changes.  Cardiovascular:  No chest pain, dyspnea on exertion, edema, orthopnea, palpitations, paroxysmal nocturnal dyspnea. Dermatological: No rash, lesions/masses Respiratory: No cough, dyspnea Urologic: No hematuria, dysuria Abdominal:   No nausea, vomiting, diarrhea, bright red blood per rectum, melena, or hematemesis Neurologic:  No visual changes, wkns, changes in mental status. All other systems reviewed and are otherwise negative except as noted above.  Physical Exam  Blood pressure 158/83, pulse 99, temperature 98.1 F (36.7 C), temperature source Oral, resp. rate 20, height 5\' 6"  (1.676 m), weight 176 lb 11.2 oz (80.151 kg), SpO2 100.00%.  General: Pleasant, NAD Psych: Normal affect. Neuro: Alert and oriented X 3. Moves all extremities spontaneously. HEENT: Normal  Neck: Supple without bruits or JVD. Lungs:  Resp regular and unlabored, CTA. Heart: RRR no s3, s4, or murmurs. Abdomen: Soft, non-tender, non-distended, BS + x 4.  Extremities: No clubbing, cyanosis or edema. DP/PT/Radials 2+ and equal bilaterally.  Labs  Recent Labs  08/03/13 1652 08/03/13 2158 08/04/13 0420  TROPONINI <0.30 <0.30 <0.30   Lab Results  Component Value Date   WBC 8.6 08/04/2013   HGB 16.6 08/04/2013   HCT 50.4 08/04/2013   MCV 86.0 08/04/2013   PLT 210 08/04/2013    Recent Labs Lab 08/03/13 1652 08/04/13 0420  NA  --  137  K  --  4.0  CL  --  99  CO2  --  25  BUN  --  12  CREATININE 0.98 0.92  CALCIUM  --  9.7  PROT 7.3  --   BILITOT 0.6  --     ALKPHOS 127*  --   ALT 21  --   AST 20  --   GLUCOSE  --  119*   Lab Results  Component Value Date   CHOL 156 08/05/2009   HDL 35* 08/05/2009   LDLCALC 106* 08/05/2009   TRIG 77 08/05/2009   Lab Results  Component Value Date   DDIMER <0.27 08/03/2013   Radiology/Studies  Dg Chest 2 View 08/03/2013   CLINICAL DATA:  Chest pain, cough, shortness of Breath  EXAM: CHEST  2 VIEW  COMPARISON:  01/31/2012  FINDINGS: Cardiomediastinal silhouette is stable. No acute infiltrate or pleural effusion. No pulmonary edema. Bony thorax is unremarkable.  IMPRESSION: No active cardiopulmonary disease.     Echocardiogram - none  ECG: Sinus tachycardia, LVH  Tele: SR    ASSESSMENT AND PLAN  43 year old with chest pain  1. Chest with some typical features - significant risk factors , the patient requires stress test - would proceed today with an exercise nuclear stress test. Continue ASA, add low dose BB- Toprol XL 25 mg po daily  2. HTN - uncontrolled - LVH on ECG, echo pending, we would recommend to add Lisinopril 5 mg po daily  3. Hyperlipidemia - as a diabetic he requires moderate to high dose of high potency statins - would increase lipitor to 40 mg po daily   Signed, Lars MassonKatarina H Elyjah Hazan, MD, [facility] 08/04/2013, 8:43 AM

## 2013-08-05 ENCOUNTER — Observation Stay (HOSPITAL_COMMUNITY): Payer: BC Managed Care – PPO

## 2013-08-05 DIAGNOSIS — R079 Chest pain, unspecified: Secondary | ICD-10-CM

## 2013-08-05 DIAGNOSIS — F17200 Nicotine dependence, unspecified, uncomplicated: Secondary | ICD-10-CM

## 2013-08-05 DIAGNOSIS — I10 Essential (primary) hypertension: Secondary | ICD-10-CM

## 2013-08-05 LAB — GLUCOSE, CAPILLARY
GLUCOSE-CAPILLARY: 128 mg/dL — AB (ref 70–99)
GLUCOSE-CAPILLARY: 136 mg/dL — AB (ref 70–99)

## 2013-08-05 MED ORDER — AMLODIPINE BESYLATE 10 MG PO TABS: 10.0000 mg | ORAL_TABLET | Freq: Every day | ORAL | Status: DC

## 2013-08-05 MED ORDER — TECHNETIUM TC 99M SESTAMIBI - CARDIOLITE
10.0000 | Freq: Once | INTRAVENOUS | Status: AC | PRN
  Administered 2013-08-05: 09:00:00 10 via INTRAVENOUS

## 2013-08-05 MED ORDER — NICOTINE 21 MG/24HR TD PT24: 21.0000 mg | MEDICATED_PATCH | Freq: Every day | TRANSDERMAL | Status: DC

## 2013-08-05 MED ORDER — TECHNETIUM TC 99M SESTAMIBI - CARDIOLITE
30.0000 | Freq: Once | INTRAVENOUS | Status: AC | PRN
  Administered 2013-08-05: 11:00:00 30 via INTRAVENOUS

## 2013-08-05 MED ORDER — ATORVASTATIN CALCIUM 40 MG PO TABS: 40.0000 mg | ORAL_TABLET | Freq: Every day | ORAL | Status: DC

## 2013-08-05 MED ORDER — METOPROLOL SUCCINATE ER 25 MG PO TB24: 25.0000 mg | ORAL_TABLET | Freq: Every day | ORAL | Status: DC

## 2013-08-05 MED ORDER — ASPIRIN 81 MG PO TBEC: 81.0000 mg | DELAYED_RELEASE_TABLET | Freq: Every day | ORAL | Status: DC

## 2013-08-05 MED ORDER — HYDROCHLOROTHIAZIDE 25 MG PO TABS: 25.0000 mg | ORAL_TABLET | Freq: Every day | ORAL | Status: DC

## 2013-08-05 NOTE — Progress Notes (Addendum)
CARE MANAGEMENT NOTE 08/05/2013  Patient:  Jimmy Parker,Jimmy Parker   Account Number:  [id number]  Date Initiated:  08/05/2013  Documentation initiated by:  [facility] IncHAVIS,Olanda Downie  Subjective/Objective Assessment:   chest pain     Action/Plan:   Anticipated DC Date:  08/05/2013   Anticipated DC Plan:  HOME/SELF CARE      DC Planning Services  CM consult  PCP issues      Choice offered to / List presented to:             Status of service:  Completed, signed off Medicare Important Message given?   (If response is "NO", the following Medicare IM given date fields will be blank) Date Medicare IM given:   Date Additional Medicare IM given:    Discharge Disposition:  HOME/SELF CARE  Per UR Regulation:    If discussed at Long Length of Stay Meetings, dates discussed:    Comments:  08/05/2013 3:58 PM  NCM spoke ot pt and states he will need a note for work. NCM spoke to attending and pt should return to work on August 08, 2013. MD provided not for pt for work. Explained to pt to contact customer service by calling toll free number on the back of his insurance card for a provider list. Isidoro DonningAlesia Steffany Schoenfelder RN CCM Case Mgmt phone [phone]

## 2013-08-05 NOTE — Discharge Summary (Signed)
Physician Discharge Summary  Jimmy Parker:[id number] DOB: May 10, 1970 DOA: 08/03/2013  PCP: No primary provider on file.  Admit date: 08/03/2013 Discharge date: 08/05/2013  Time spent: 45 minutes  Recommendations for Outpatient Follow-up:  1. PCP in 1 week 2. Dr.Nelson in 2 weeks  Discharge Diagnoses:  Principal Problem:   Chest pain   LVH   DIABETES MELLITUS, diet controlled   HYPERLIPIDEMIA   TOBACCO ABUSE   HYPERTENSION, BENIGN ESSENTIAL   Discharge Condition: stable  Diet recommendation: low sodium, diabetic  Filed Weights   08/03/13 1723 08/04/13 0549 08/05/13 0552  Weight: 80.786 kg (178 lb 1.6 oz) 80.151 kg (176 lb 11.2 oz) 80.6 kg (177 lb 11.1 oz)    History of present illness:  Jimmy Parker is a 43 y.o. 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 Patient stated her chest pain lasted about 30 minutes and resolved on its own. Patient does endorse right upper extremity pain and soreness which was intermittent at that time as well as diaphoresis, nausea, dizziness. Patient denies any palpitations, no paroxysmal nocturnal dyspnea, no orthopnea, no fever, no chills, no lower extremity edema, no diarrhea, no constipation, no dysuria. Patient does endorse a chronic cough which is unchanged. Patient also endorses generalized weakness. Patient denies any hemoptysis. Patient denies any recent long car rides. Patient denies any recent surgeries.  Patient was seen in the emergency room basic metabolic profile obtained at a potassium of 3.5 otherwise was within normal limits. CBC  had a hemoglobin of 17.0 otherwise was within normal limits. Chest x-ray was unremarkable. EKG showed a sinus tachycardia with a rate of 101.  Hospital Course:  1. Chest with some typical features -  -ruled out for MI based on cardiac enzymes -given significant risk factors , underwent Myoview which was negative as noted below -continue ASA, and low dose Toprol added per cardiology  2. HTN - uncontrolled - LVH on ECG, echo with EF of 50-55% -continue HCTZ , norvasc, added Toprol -based on BP at FU may need more titration  3. Hyperlipidemia - as a diabetic he requires moderate to high dose of high potency statins , started on lipitor to 40 mg po daily  LDL 125  4. DM -diet controlled -hbaic 6.3  5. Tobacco abuse -counseled -provided with Nicotine patch at discharge    Procedures:  Myoview: IMPRESSION:  1. Negative for pharmacologic-stress induced ischemia.  2. Left ventricular ejection fraction 48%.  ECHO Study Conclusions - Left ventricle: The cavity size was normal. There was mild concentric hypertrophy. Systolic function was normal. The estimated ejection fraction was in the range of 50% to 55%. Wall motion was normal; there were no regional wall motion abnormalities. Doppler parameters are consistent with abnormal left ventricular relaxation (grade 1 diastolic dysfunction).    Consultations:  Cards Dr.Nelson  Discharge Exam: Filed Vitals:   08/05/13 1225  BP: 143/82  Pulse: 78  Temp:   Resp: 18    General: AAOx3 Cardiovascular:S1S2/RRR Respiratory: CTAB  Discharge Instructions You were cared for by a hospitalist during your hospital stay. If you have any questions about your discharge medications or the care you received  while you were in the hospital after you are discharged, you can call the unit and asked to speak with the hospitalist on call if the hospitalist that took care of you is not available. Once you are discharged, your primary care  physician will handle any further medical issues. Please note that NO REFILLS for any discharge medications will be authorized once you are discharged, as it is imperative that you return to your primary care physician (or establish a relationship with a primary care physician if you do not have one) for your aftercare needs so that they can reassess your need for medications and monitor your lab values.  Discharge Orders   Future Orders Complete By Expires   Diet - low sodium heart healthy  As directed    Diet Carb Modified  As directed    Increase activity slowly  As directed        Medication List         amLODipine 10 MG tablet  Commonly known as:  NORVASC  Take 1 tablet (10 mg total) by mouth daily.     aspirin 81 MG EC tablet  Take 1 tablet (81 mg total) by mouth daily.     atorvastatin 40 MG tablet  Commonly known as:  LIPITOR  Take 1 tablet (40 mg total) by mouth daily at 6 PM.     hydrochlorothiazide 25 MG tablet  Commonly known as:  HYDRODIURIL  Take 1 tablet (25 mg total) by mouth daily.     metoprolol succinate 25 MG 24 hr tablet  Commonly known as:  TOPROL-XL  Take 1 tablet (25 mg total) by mouth daily.     nicotine 21 mg/24hr patch  Commonly known as:  NICODERM CQ - dosed in mg/24 hours  Place 1 patch (21 mg total) onto the skin daily.     VISINE 0.05 % ophthalmic solution  Generic drug:  tetrahydrozoline  Place 2 drops into both eyes daily as needed (for dry eyes).       No Known Allergies     Follow-up Information   Follow up with Lars MassonNELSON, KATARINA H, MD. Schedule an appointment as soon as possible for a visit in 2 weeks.   Specialty:  Cardiology   Contact information:   [address] [place] [id number] [phone]       Follow up with PCP. Schedule an appointment as soon as possible for a visit in 2 weeks.      Follow up with Return To Work. (Return to work on Wednesday, August 08, 2013. Please contact you Primary Care  Physician or Cardiologist if you are unable to return to work. )        The results of significant diagnostics from this hospitalization (including imaging, microbiology, ancillary and laboratory) are listed below for reference.    Significant Diagnostic Studies: Dg Chest 2 View  08/03/2013   CLINICAL DATA:  Chest pain, cough, shortness of Breath  EXAM: CHEST  2 VIEW  COMPARISON:  01/31/2012  FINDINGS: Cardiomediastinal silhouette is stable. No acute infiltrate or pleural effusion. No pulmonary edema. Bony thorax is unremarkable.  IMPRESSION: No active cardiopulmonary disease.   Electronically Signed   By: Natasha MeadLiviu  Pop M.D.   On: 08/03/2013 14:33   Nm Myocar Multi W/spect W/wall Motion / Ef  08/05/2013   CLINICAL DATA:  Chest pain  EXAM: MYOCARDIAL IMAGING WITH SPECT (REST AND PHARMACOLOGIC-STRESS)  GATED LEFT VENTRICULAR WALL MOTION STUDY  LEFT VENTRICULAR EJECTION FRACTION  TECHNIQUE: Standard myocardial SPECT imaging was performed after resting intravenous injection of 10 mCi [id number] sestamibi. Subsequently, intravenous infusion of Lexiscan was performed under the supervision of the Cardiology staff. At peak effect of the drug, 30 mCi [id number] sestamibi was injected intravenously and standard myocardial SPECT imaging was performed. Quantitative gated imaging was also performed to evaluate left ventricular wall motion, and estimate left ventricular ejection fraction.  COMPARISON:  None.  FINDINGS: The stress SPECT images demonstrate physiologic distribution of radiopharmaceutical. Rest images demonstrate no perfusion defects.  The gated stress SPECT images demonstrate normal left ventricular myocardial thickening. No focal wall motion abnormality is seen.  Calculated left ventricular end-diastolic volume 115 mL, end-systolic volume 60 mL, ejection fraction of 48%.  IMPRESSION: 1. Negative for pharmacologic-stress induced ischemia.  2. Left ventricular ejection fraction 48%.   Electronically Signed   By:  Charline BillsSriyesh  Krishnan M.D.   On: 08/05/2013 12:27    Microbiology: No results found for this or any previous visit (from the past 240 hour(s)).   Labs: Basic Metabolic Panel:  Recent Labs Lab 08/03/13 1443 08/03/13 1652 08/04/13 0420  NA 140  --  137  K 3.5*  --  4.0  CL 102  --  99  CO2  --   --  25  GLUCOSE 101*  --  119*  BUN 7  --  12  CREATININE 1.10 0.98 0.92  CALCIUM  --   --  9.7  MG  --  1.9  --    Liver Function Tests:  Recent Labs Lab 08/03/13 1652  AST 20  ALT 21  ALKPHOS 127*  BILITOT 0.6  PROT 7.3  ALBUMIN 4.2   No results found for this basename: LIPASE, AMYLASE,  in the last 168 hours No results found for this basename: AMMONIA,  in the last 168 hours CBC:  Recent Labs Lab 08/03/13 1430 08/03/13 1443 08/04/13 0420  WBC 8.9  --  8.6  NEUTROABS 5.5  --   --   HGB 17.0 18.7* 16.6  HCT 50.1 55.0* 50.4  MCV 83.8  --  86.0  PLT 241  --  210   Cardiac Enzymes:  Recent Labs Lab 08/03/13 1652 08/03/13 2158 08/04/13 0420  TROPONINI <0.30 <0.30 <0.30   BNP: BNP (last 3 results) No results found for this basename: PROBNP,  in the last 8760 hours CBG:  Recent Labs Lab 08/04/13 0617 08/04/13 1143 08/04/13 2107 08/05/13 0739 08/05/13 1322  GLUCAP 128* 103* 146* 128* 136*       Signed:  Zannie CovePreetha Maisee Vollman  [facility] 08/05/2013, 5:26 PM

## 2013-08-06 ENCOUNTER — Telehealth: Payer: Self-pay | Admitting: Cardiology

## 2013-08-06 NOTE — Telephone Encounter (Signed)
Follow up       Pt needs a call back he would like to find out if he can go back to work RIGHT NOW. Or does he need to be out of work. 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  Pt needs a Dr's Note for this pt will pick it up please give him a call.  Pt really needs this today he is scheduled to go in 4/28.

## 2013-08-06 NOTE — Telephone Encounter (Signed)
Will route this message to Dr Delton SeeNelson for further review.

## 2013-08-07 ENCOUNTER — Encounter: Payer: Self-pay | Admitting: *Deleted

## 2013-08-07 NOTE — Telephone Encounter (Signed)
Follow up     Patient calling back regarding message that was sent on yesterday. -patient stated he on his way up here to the office

## 2013-08-07 NOTE — Telephone Encounter (Signed)
Follow up      Pt want to know if he can be out of work until he sees Acup[facility]urist[place] on 08-27-13.  He is due to go back to work tomorrow.  Pt can come by and pick up note today.  He says it is urgent that he hears back from us today

## 2013-08-07 NOTE — Telephone Encounter (Signed)
Dr Delton SeeNelson cleared pt to return to work on 08/08/13.  Letter obtained and documented in EPIC.  Will notify pt of this.

## 2013-08-07 NOTE — Telephone Encounter (Signed)
Called pt to notify him that his return to work letter will be waiting for him to pick up at the front. Call went straight to pts voicemail.  LMTCB

## 2013-08-23 ENCOUNTER — Encounter: Payer: Self-pay | Admitting: *Deleted

## 2013-08-27 ENCOUNTER — Encounter: Payer: Self-pay | Admitting: Physician Assistant

## 2013-08-27 ENCOUNTER — Ambulatory Visit (INDEPENDENT_AMBULATORY_CARE_PROVIDER_SITE_OTHER): Payer: BC Managed Care – PPO | Admitting: Physician Assistant

## 2013-08-27 VITALS — BP 138/72 | HR 95 | Ht 66.0 in | Wt 180.8 lb

## 2013-08-27 DIAGNOSIS — R079 Chest pain, unspecified: Secondary | ICD-10-CM

## 2013-08-27 DIAGNOSIS — F17200 Nicotine dependence, unspecified, uncomplicated: Secondary | ICD-10-CM

## 2013-08-27 DIAGNOSIS — E119 Type 2 diabetes mellitus without complications: Secondary | ICD-10-CM

## 2013-08-27 DIAGNOSIS — E785 Hyperlipidemia, unspecified: Secondary | ICD-10-CM

## 2013-08-27 DIAGNOSIS — I10 Essential (primary) hypertension: Secondary | ICD-10-CM

## 2013-08-27 MED ORDER — ATORVASTATIN CALCIUM 40 MG PO TABS: 40.0000 mg | ORAL_TABLET | Freq: Every day | ORAL | Status: DC

## 2013-08-27 MED ORDER — METOPROLOL SUCCINATE ER 25 MG PO TB24: 25.0000 mg | ORAL_TABLET | Freq: Every day | ORAL | Status: DC

## 2013-08-27 MED ORDER — AMLODIPINE BESYLATE 10 MG PO TABS: 10.0000 mg | ORAL_TABLET | Freq: Every day | ORAL | Status: DC

## 2013-08-27 MED ORDER — HYDROCHLOROTHIAZIDE 25 MG PO TABS: 25.0000 mg | ORAL_TABLET | Freq: Every day | ORAL | Status: DC

## 2013-08-27 NOTE — Patient Instructions (Signed)
I SENT IN REFILLS FOR YOU MEDICATIONS  [facility] [address]  [facility] WORK IN 4 WEEK FOR FASTING CHOLESTEROL PANEL AND BMET  Your physician wants you to follow-up in: 6 MONTHS WITH DR. Delton SeeNELSON. You will receive a reminder letter in the mail two months in advance. If you don't receive a letter, please call our office to schedule the follow-up appointment.

## 2013-08-27 NOTE — Progress Notes (Signed)
[address] [place], [place]  [address] Phone: [phone] Fax:  [phone]  Date:  08/27/2013   ID:  Jimmy Parker, DOB 01/16/1971, MRN [id number]  PCP:  No primary provider on file.  Cardiologist:  Dr. Tobias AlexanderKatarina Nelson      History of Present Illness: Jimmy Parker is a 43 y.o. male with a hx of tobacco abuse, DM, HTN, HL, FHx of premature CAD.  He was admitted 4/24-4/26 with chest pain.  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  CEs remained negative.  He was seen Dr. Tobias AlexanderKatarina Nelson.  Echo demonstrated normal LV function and no WMA.  He underwent inpatient Myoview which was negative for ischemia.  No further cardiac workup was recommended.    He has been doing well since discharge. He denies chest discomfort or shortness of breath. 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 He denies syncope. He denies orthopnea, PND or edema. He has a chronic cough related to smoking. There has been no significant change.   Studies:  - Echo (07/2013):  Mild LVH, EF 50-55%, no RWMA, Gr 1 DD   - Nuclear (07/2013):  No ischemia, EF 48%   Recent Labs: 08/03/2013: ALT 21  08/04/2013: Creatinine 0.92; HDL Cholesterol by NMR 30*; Hemoglobin 16.6; LDL (calc) 125*; Potassium 4.0   Wt Readings from Last 3 Encounters:  08/05/13 177 lb 11.1 oz (80.6 kg)  09/16/09 170 lb 11.2 oz (77.429 kg)  08/05/09 169 lb 1.6 oz (76.703 kg)     Past Medical History  Diagnosis Date  . Hypertension   . Diabetes mellitus   . Poor historian     Current Outpatient Prescriptions  Medication Sig Dispense Refill  . amLODipine (NORVASC) 10 MG tablet Take 1 tablet (10 mg total) by mouth daily.  30 tablet  0  . aspirin EC 81 MG EC tablet Take 1 tablet (81 mg total) by mouth daily.      Marland Kitchen. atorvastatin (LIPITOR) 40 MG tablet Take 1 tablet (40 mg total) by mouth daily at 6 PM.  30 tablet  0  . hydrochlorothiazide (HYDRODIURIL) 25  MG tablet Take 1 tablet (25 mg total) by mouth daily.  30 tablet  0  . metoprolol succinate (TOPROL-XL) 25 MG 24 hr tablet Take 1 tablet (25 mg total) by mouth daily.  30 tablet  0  . nicotine (NICODERM CQ - DOSED IN MG/24 HOURS) 21 mg/24hr patch Place 1 patch (21 mg total) onto the skin daily.  28 patch  0  . tetrahydrozoline (VISINE) 0.05 % ophthalmic solution Place 2 drops into both eyes daily as needed (for dry eyes).       No current facility-administered medications for this visit.    Allergies:   Review of patient's allergies indicates no known allergies.   Social History:  The patient  reports that he has been smoking.  He does not have any smokeless tobacco history on file. He reports that he drinks alcohol. He reports that he does not use illicit drugs.   Family History:  The patient's family history is not on file.   ROS:  Please see the history of present illness.      All other systems reviewed and negative.   PHYSICAL EXAM: VS:  There were no vitals taken for this visit. Well nourished, well developed, in no acute distress HEENT: normal Neck: no JVD  Cardiac:  normal S1, S2; RRR; no murmur Lungs:  clear to auscultation bilaterally, no wheezing, rhonchi or rales Abd: soft, nontender, no hepatomegaly Ext: no edema Skin: warm and dry Neuro:  CNs 2-12 intact, no focal abnormalities noted  EKG:  NSR, HR 95, normal axis, LVH, inferior T wave inversions, no change when compared to prior tracing dated 08/05/13     ASSESSMENT AND PLAN:  1. Chest pain: Symptoms resolved. Workup in the hospital was unremarkable with low risk Myoview negative for ischemia and a normal echocardiogram. He has a lot of risk factors for CAD. I would recommend continuing beta blocker, statin and aspirin. 2. HYPERTENSION, BENIGN ESSENTIAL: Controlled. Continue current therapy. Check a followup basic metabolic panel in one month. 3. HYPERLIPIDEMIA: Continue statin. Check followup lipids LFTs in one  month. 4. DIABETES MELLITUS:   Diet controlled. Refer to primary care. 5. TOBACCO ABUSE: I have recommended cessation. 6. Disposition: Followup with Dr. Delton SeeNelson in 6 months.  Signed, Tereso NewcomerScott Kenedy Haisley, PA-C  08/27/2013 11:35 AM

## 2013-09-24 ENCOUNTER — Other Ambulatory Visit: Payer: Self-pay

## 2013-10-01 ENCOUNTER — Other Ambulatory Visit (INDEPENDENT_AMBULATORY_CARE_PROVIDER_SITE_OTHER): Payer: BC Managed Care – PPO

## 2013-10-01 ENCOUNTER — Telehealth: Payer: Self-pay | Admitting: *Deleted

## 2013-10-01 DIAGNOSIS — E785 Hyperlipidemia, unspecified: Secondary | ICD-10-CM

## 2013-10-01 DIAGNOSIS — I10 Essential (primary) hypertension: Secondary | ICD-10-CM

## 2013-10-01 LAB — BASIC METABOLIC PANEL
BUN: 12 mg/dL (ref 6–23)
CO2: 30 mEq/L (ref 19–32)
Calcium: 9.9 mg/dL (ref 8.4–10.5)
Chloride: 96 mEq/L (ref 96–112)
Creatinine, Ser: 1 mg/dL (ref 0.4–1.5)
GFR: 107.86 mL/min (ref >60.00)
Glucose, Bld: 183 mg/dL — ABNORMAL HIGH (ref 70–99)
POTASSIUM: 3.3 meq/L — AB (ref 3.5–5.1)
SODIUM: 135 meq/L (ref 135–145)

## 2013-10-01 LAB — LIPID PANEL
Cholesterol: 204 mg/dL — ABNORMAL HIGH (ref 0–200)
HDL: 39.5 mg/dL (ref >39.00)
LDL Cholesterol: 134 mg/dL — ABNORMAL HIGH (ref 0–99)
NonHDL: 164.5
TRIGLYCERIDES: 151 mg/dL — AB (ref 0.0–149.0)
Total CHOL/HDL Ratio: 5
VLDL: 30.2 mg/dL (ref 0.0–40.0)

## 2013-10-01 LAB — HEPATIC FUNCTION PANEL
ALBUMIN: 4.4 g/dL (ref 3.5–5.2)
ALT: 38 U/L (ref 0–53)
AST: 22 U/L (ref 0–37)
Alkaline Phosphatase: 113 U/L (ref 39–117)
BILIRUBIN TOTAL: 1.1 mg/dL (ref 0.2–1.2)
Bilirubin, Direct: 0 mg/dL (ref 0.0–0.3)
Total Protein: 7.6 g/dL (ref 6.0–8.3)

## 2013-10-01 MED ORDER — SPIRONOLACTONE 25 MG PO TABS: 25.0000 mg | ORAL_TABLET | Freq: Every day | ORAL | Status: DC

## 2013-10-01 NOTE — Telephone Encounter (Signed)
a man the phone and said I had wrong #. I then said I was calling from dr office, did he have another # for pt I could call. He said he will take a message. I gave [phone] for cb.

## 2013-10-02 NOTE — Telephone Encounter (Signed)
Follow up ° ° ° ° °Returning a nurses call °

## 2013-10-02 NOTE — Telephone Encounter (Signed)
please disregard phone note showing from 6/23 9:03 today, wrong pt. Mr. Jimmy Parker notified today about lab results and to d/c hctz and start spironolactone 25 mg daily, bmet 7/6, pt said ok and thank you.

## 2013-10-04 NOTE — Progress Notes (Signed)
Quick Note:  LMTCB 6/25 - PE ______

## 2013-10-05 ENCOUNTER — Telehealth: Payer: Self-pay | Admitting: *Deleted

## 2013-10-05 NOTE — Telephone Encounter (Signed)
lmptcb about lab results to increase lipitor to 80 mg qd, with FLP/LFT 3 months.

## 2013-10-10 ENCOUNTER — Encounter: Payer: Self-pay | Admitting: *Deleted

## 2013-10-10 NOTE — Telephone Encounter (Signed)
lmptcb x 3 for lab results for lipid panel and to increase liptor to 80 mg w/FLP/LFT in 3 months. I will send out a letter today as well.

## 2013-10-15 ENCOUNTER — Telehealth: Payer: Self-pay | Admitting: *Deleted

## 2013-10-15 ENCOUNTER — Other Ambulatory Visit (INDEPENDENT_AMBULATORY_CARE_PROVIDER_SITE_OTHER): Payer: BC Managed Care – PPO

## 2013-10-15 DIAGNOSIS — I10 Essential (primary) hypertension: Secondary | ICD-10-CM

## 2013-10-15 LAB — BASIC METABOLIC PANEL
BUN: 9 mg/dL (ref 6–23)
CO2: 28 mEq/L (ref 19–32)
Calcium: 9.5 mg/dL (ref 8.4–10.5)
Chloride: 100 mEq/L (ref 96–112)
Creatinine, Ser: 1.1 mg/dL (ref 0.4–1.5)
GFR: 90.42 mL/min (ref >60.00)
GLUCOSE: 247 mg/dL — AB (ref 70–99)
Potassium: 3.8 mEq/L (ref 3.5–5.1)
Sodium: 136 mEq/L (ref 135–145)

## 2013-10-15 NOTE — Telephone Encounter (Signed)
pt came in today asking to s/w someone in reference to his medications. Baxter HireKristen and MyrtletownHirra both from the check in desk called me since pt has seen Loews CorporationScott W. PA x 1. I asked the girls if they would please have Triage s/w pt since I am in the middle of clinic right now. I was then told that Triage said they were busy however; Adele SchilderNivida M. RN told Blossom HoopsHirra she would try to s/w pt about medication question. Hirra went back up front to her desk, then came back to me and said pt is being very insistent about s/w someone NOW about this. I then asked Janit BernMelinda P. RN if she would watch my clinic with Bing NeighborsScott W. PA so that I may go s/w pt even though I was in the middle of clinic. I then went to Hirra's desk up front to s/w pt. I introduced myself and shook pt's hand. I asked him what was the question he had about his medication. He states he wants to know why he was changed to spironolactone and why we stopped HCTZ, said his K+ was high and he was reading all the information in the insert that came with the medication and said if K+ was high why was he taking spironolactone. I explained to pt that his K+ was not high it was low. I showed him his lab results from 10/01/13 which also has my notes of my conversation with him about lab results 10/01/13 at 5:22 pm. Pt then said this was a problem be cause he did not see a doctor, but a PA. I stated Tereso NewcomerScott Weaver, PA is very good and very though at his job. I advised pt that the lab results on 6/22 @ 5:22 pm I had shown to DOD Dr. Anne FuSkains that day and it was his recommendation due to K+ 3.3 10/01/13 to stop the HCTZ and start Spironolactone 25 mg daily. Pt then reads from the med insert if you have kidney problems not to take this medication. I tried multiple times to reassure pt that the doctor was very aware of what medications he was already on and due to K+ 3.3 needed to change diuretic to try not to deplete out more K+. Pt did not seem to want to understand and was being difficult to explain  this too. He then said he may just come up here one day next week and get all of his results and take [facility] to [facility]. I offered for pt to s/w Clinical Manager Doylene BodeGina Lurz, RN but he refused.

## 2013-10-15 NOTE — Telephone Encounter (Signed)
Looks like K+ today was ok.  It is reasonable to remain on the Spironolactone.  Or, if he is more comfortable taking the HCTZ instead of Spironolactone -    -  He could stop his Spironolactone.    -  Start back on his prior dose of HCTZ.   -  Start K+ 20 mEq QD.   -  Check BMET in 1 week.  Tereso NewcomerScott Weaver, PA-C   10/15/2013 4:55 PM

## 2013-10-18 ENCOUNTER — Telehealth: Payer: Self-pay | Admitting: *Deleted

## 2013-10-18 NOTE — Telephone Encounter (Signed)
lmptcb to advise per Scott W. PAC reasonable to stay on spironolactone however; if he wants can go back to HCTZ however; will need to add K+ 20 meq daily, and bmet in 1 week. Asked ptcb to let me know what he wants to do, [phone] 

## 2013-10-18 NOTE — Telephone Encounter (Signed)
lmptcb to advise per Bing NeighborsScott W. PAC reasonable to stay on spironolactone however; if he wants can go back to HCTZ however; will need to add K+ 20 meq daily, and bmet in 1 week. Asked ptcb to let me know what he wants to do, [phone]

## 2013-10-22 NOTE — Telephone Encounter (Signed)
lmom lab work normal 

## 2013-10-29 ENCOUNTER — Telehealth: Payer: Self-pay | Admitting: *Deleted

## 2013-10-29 NOTE — Telephone Encounter (Signed)
lmptcb about spironolactone or if he wants to go back to hctz. I have lmptcb x 3 .

## 2013-10-29 NOTE — Telephone Encounter (Signed)
lmptcb about spironolactone or if he wants to go back to hctz. I have lmptcb x 3 . 

## 2014-01-07 ENCOUNTER — Ambulatory Visit: Payer: Self-pay | Admitting: Internal Medicine

## 2014-01-07 DIAGNOSIS — Z0289 Encounter for other administrative examinations: Secondary | ICD-10-CM

## 2015-06-22 ENCOUNTER — Inpatient Hospital Stay (HOSPITAL_COMMUNITY)
Admission: EM | Admit: 2015-06-22 | Discharge: 2015-06-24 | DRG: 282 | Disposition: A | Discharge status: Home / Self Care | Payer: Self-pay | Attending: Cardiology | Admitting: Cardiology

## 2015-06-22 DIAGNOSIS — F17200 Nicotine dependence, unspecified, uncomplicated: Secondary | ICD-10-CM | POA: Diagnosis present

## 2015-06-22 DIAGNOSIS — R079 Chest pain, unspecified: Secondary | ICD-10-CM | POA: Diagnosis present

## 2015-06-22 DIAGNOSIS — E785 Hyperlipidemia, unspecified: Secondary | ICD-10-CM | POA: Diagnosis present

## 2015-06-22 DIAGNOSIS — I10 Essential (primary) hypertension: Secondary | ICD-10-CM | POA: Diagnosis present

## 2015-06-22 DIAGNOSIS — E119 Type 2 diabetes mellitus without complications: Secondary | ICD-10-CM

## 2015-06-22 DIAGNOSIS — Z8249 Family history of ischemic heart disease and other diseases of the circulatory system: Secondary | ICD-10-CM

## 2015-06-22 DIAGNOSIS — I2510 Atherosclerotic heart disease of native coronary artery without angina pectoris: Secondary | ICD-10-CM | POA: Diagnosis present

## 2015-06-22 DIAGNOSIS — I214 Non-ST elevation (NSTEMI) myocardial infarction: Principal | ICD-10-CM | POA: Diagnosis present

## 2015-06-23 ENCOUNTER — Emergency Department (HOSPITAL_COMMUNITY): Payer: Self-pay

## 2015-06-23 ENCOUNTER — Encounter (HOSPITAL_COMMUNITY)
Admission: EM | Disposition: A | Discharge status: Home / Self Care | Payer: Self-pay | Source: Home / Self Care | Attending: Cardiology

## 2015-06-23 ENCOUNTER — Encounter (HOSPITAL_COMMUNITY): Payer: Self-pay | Admitting: Emergency Medicine

## 2015-06-23 DIAGNOSIS — E1159 Type 2 diabetes mellitus with other circulatory complications: Secondary | ICD-10-CM

## 2015-06-23 DIAGNOSIS — R079 Chest pain, unspecified: Secondary | ICD-10-CM

## 2015-06-23 DIAGNOSIS — I214 Non-ST elevation (NSTEMI) myocardial infarction: Secondary | ICD-10-CM | POA: Diagnosis present

## 2015-06-23 DIAGNOSIS — E785 Hyperlipidemia, unspecified: Secondary | ICD-10-CM | POA: Diagnosis present

## 2015-06-23 DIAGNOSIS — I2510 Atherosclerotic heart disease of native coronary artery without angina pectoris: Secondary | ICD-10-CM

## 2015-06-23 HISTORY — PX: CARDIAC CATHETERIZATION: SHX172

## 2015-06-23 HISTORY — DX: Hyperlipidemia, unspecified: E78.5

## 2015-06-23 HISTORY — DX: Non-ST elevation (NSTEMI) myocardial infarction: I21.4

## 2015-06-23 LAB — LIPASE, BLOOD: LIPASE: 125 U/L — AB (ref 11–51)

## 2015-06-23 LAB — PROTIME-INR
INR: 1 (ref 0.00–1.49)
INR: 1.02 (ref 0.00–1.49)
Prothrombin Time: 13.4 seconds (ref 11.6–15.2)
Prothrombin Time: 13.6 seconds (ref 11.6–15.2)

## 2015-06-23 LAB — COMPREHENSIVE METABOLIC PANEL
ALT: 20 U/L (ref 17–63)
ALT: 21 U/L (ref 17–63)
AST: 53 U/L — AB (ref 15–41)
AST: 64 U/L — AB (ref 15–41)
Albumin: 3.4 g/dL — ABNORMAL LOW (ref 3.5–5.0)
Albumin: 4.1 g/dL (ref 3.5–5.0)
Alkaline Phosphatase: 82 U/L (ref 38–126)
Alkaline Phosphatase: 90 U/L (ref 38–126)
Anion gap: 11 (ref 5–15)
Anion gap: 9 (ref 5–15)
BILIRUBIN TOTAL: 1 mg/dL (ref 0.3–1.2)
BUN: 12 mg/dL (ref 6–20)
BUN: 7 mg/dL (ref 6–20)
CHLORIDE: 103 mmol/L (ref 101–111)
CO2: 26 mmol/L (ref 22–32)
CO2: 26 mmol/L (ref 22–32)
CREATININE: 0.81 mg/dL (ref 0.61–1.24)
CREATININE: 0.9 mg/dL (ref 0.61–1.24)
Calcium: 9.2 mg/dL (ref 8.9–10.3)
Calcium: 9.9 mg/dL (ref 8.9–10.3)
Chloride: 104 mmol/L (ref 101–111)
GFR calc Af Amer: >60 mL/min (ref >60)
Glucose, Bld: 118 mg/dL — ABNORMAL HIGH (ref 65–99)
Glucose, Bld: 138 mg/dL — ABNORMAL HIGH (ref 65–99)
POTASSIUM: 3.7 mmol/L (ref 3.5–5.1)
POTASSIUM: 3.9 mmol/L (ref 3.5–5.1)
SODIUM: 140 mmol/L (ref 135–145)
Sodium: 139 mmol/L (ref 135–145)
TOTAL PROTEIN: 5.7 g/dL — AB (ref 6.5–8.1)
Total Bilirubin: 0.7 mg/dL (ref 0.3–1.2)
Total Protein: 7 g/dL (ref 6.5–8.1)

## 2015-06-23 LAB — URINALYSIS, ROUTINE W REFLEX MICROSCOPIC
Bilirubin Urine: NEGATIVE
GLUCOSE, UA: NEGATIVE mg/dL
HGB URINE DIPSTICK: NEGATIVE
Ketones, ur: 15 mg/dL — AB
Leukocytes, UA: NEGATIVE
Nitrite: NEGATIVE
PH: 6 (ref 5.0–8.0)
PROTEIN: NEGATIVE mg/dL
SPECIFIC GRAVITY, URINE: 1.044 — AB (ref 1.005–1.030)

## 2015-06-23 LAB — CBC
HCT: 44.2 % (ref 39.0–52.0)
HCT: 45.8 % (ref 39.0–52.0)
HEMOGLOBIN: 15.4 g/dL (ref 13.0–17.0)
Hemoglobin: 14.7 g/dL (ref 13.0–17.0)
MCH: 28.3 pg (ref 26.0–34.0)
MCH: 28.5 pg (ref 26.0–34.0)
MCHC: 33.3 g/dL (ref 30.0–36.0)
MCHC: 33.6 g/dL (ref 30.0–36.0)
MCV: 84.8 fL (ref 78.0–100.0)
MCV: 85 fL (ref 78.0–100.0)
PLATELETS: 195 10*3/uL (ref 150–400)
Platelets: 219 10*3/uL (ref 150–400)
RBC: 5.2 MIL/uL (ref 4.22–5.81)
RBC: 5.4 MIL/uL (ref 4.22–5.81)
RDW: 13 % (ref 11.5–15.5)
RDW: 13 % (ref 11.5–15.5)
WBC: 11.7 10*3/uL — ABNORMAL HIGH (ref 4.0–10.5)
WBC: 11.9 10*3/uL — ABNORMAL HIGH (ref 4.0–10.5)

## 2015-06-23 LAB — GLUCOSE, CAPILLARY: Glucose-Capillary: 107 mg/dL — ABNORMAL HIGH (ref 65–99)

## 2015-06-23 LAB — MAGNESIUM: Magnesium: 1.7 mg/dL (ref 1.7–2.4)

## 2015-06-23 LAB — I-STAT TROPONIN, ED: TROPONIN I, POC: 5.06 ng/mL — AB (ref 0.00–0.08)

## 2015-06-23 LAB — LIPID PANEL
CHOL/HDL RATIO: 3.7 ratio
CHOLESTEROL: 128 mg/dL (ref 0–200)
HDL: 35 mg/dL — AB (ref >40)
LDL Cholesterol: 74 mg/dL (ref 0–99)
TRIGLYCERIDES: 97 mg/dL (ref <150)
VLDL: 19 mg/dL (ref 0–40)

## 2015-06-23 LAB — TSH: TSH: 1.88 u[IU]/mL (ref 0.350–4.500)

## 2015-06-23 LAB — BASIC METABOLIC PANEL
ANION GAP: 8 (ref 5–15)
BUN: 11 mg/dL (ref 6–20)
CO2: 23 mmol/L (ref 22–32)
Calcium: 9 mg/dL (ref 8.9–10.3)
Chloride: 104 mmol/L (ref 101–111)
Creatinine, Ser: 0.78 mg/dL (ref 0.61–1.24)
GFR calc Af Amer: >60 mL/min (ref >60)
Glucose, Bld: 118 mg/dL — ABNORMAL HIGH (ref 65–99)
POTASSIUM: 3.6 mmol/L (ref 3.5–5.1)
SODIUM: 135 mmol/L (ref 135–145)

## 2015-06-23 LAB — APTT: aPTT: 29 seconds (ref 24–37)

## 2015-06-23 LAB — TROPONIN I
Troponin I: 6.04 ng/mL (ref <0.031)
Troponin I: 6.7 ng/mL (ref <0.031)

## 2015-06-23 LAB — BRAIN NATRIURETIC PEPTIDE: B Natriuretic Peptide: 115.1 pg/mL — ABNORMAL HIGH (ref 0.0–100.0)

## 2015-06-23 LAB — MRSA PCR SCREENING: MRSA BY PCR: NEGATIVE

## 2015-06-23 LAB — HEPARIN LEVEL (UNFRACTIONATED): HEPARIN UNFRACTIONATED: 0.25 [IU]/mL — AB (ref 0.30–0.70)

## 2015-06-23 SURGERY — LEFT HEART CATH AND CORONARY ANGIOGRAPHY

## 2015-06-23 MED ORDER — IOHEXOL 350 MG/ML SOLN
INTRAVENOUS | Status: DC | PRN
  Administered 2015-06-23: 90 mL via INTRA_ARTERIAL

## 2015-06-23 MED ORDER — ACETAMINOPHEN 325 MG PO TABS: 650.0000 mg | ORAL_TABLET | ORAL | Status: DC | PRN

## 2015-06-23 MED ORDER — CLOPIDOGREL BISULFATE 300 MG PO TABS
600.0000 mg | ORAL_TABLET | Freq: Once | ORAL | Status: AC
  Administered 2015-06-23: 600 mg via ORAL
  Filled 2015-06-23: qty 2

## 2015-06-23 MED ORDER — SODIUM CHLORIDE 0.9 % WEIGHT BASED INFUSION: 1.0000 mL/kg/h | INTRAVENOUS | Status: DC

## 2015-06-23 MED ORDER — MIDAZOLAM HCL 2 MG/2ML IJ SOLN
INTRAMUSCULAR | Status: AC
  Filled 2015-06-23: qty 2

## 2015-06-23 MED ORDER — SODIUM CHLORIDE 0.9% FLUSH: 3.0000 mL | Freq: Two times a day (BID) | INTRAVENOUS | Status: DC

## 2015-06-23 MED ORDER — SPIRONOLACTONE 25 MG PO TABS
25.0000 mg | ORAL_TABLET | Freq: Every day | ORAL | Status: DC
  Administered 2015-06-23: 25 mg via ORAL
  Filled 2015-06-23 (×2): qty 1

## 2015-06-23 MED ORDER — HEPARIN (PORCINE) IN NACL 2-0.9 UNIT/ML-% IJ SOLN
INTRAMUSCULAR | Status: AC
Start: 2015-06-23 — End: 2015-06-23
  Filled 2015-06-23: qty 1500

## 2015-06-23 MED ORDER — HEPARIN BOLUS VIA INFUSION
4000.0000 [IU] | Freq: Once | INTRAVENOUS | Status: AC
  Administered 2015-06-23: 4000 [IU] via INTRAVENOUS
  Filled 2015-06-23: qty 4000

## 2015-06-23 MED ORDER — ASPIRIN 300 MG RE SUPP: 300.0000 mg | RECTAL | Status: DC

## 2015-06-23 MED ORDER — LIDOCAINE HCL (PF) 1 % IJ SOLN
INTRAMUSCULAR | Status: DC | PRN
  Administered 2015-06-23: 2 mL via INTRADERMAL

## 2015-06-23 MED ORDER — SODIUM CHLORIDE 0.9 % IV SOLN: 250.0000 mL | INTRAVENOUS | Status: DC | PRN

## 2015-06-23 MED ORDER — HEPARIN (PORCINE) IN NACL 100-0.45 UNIT/ML-% IJ SOLN
1000.0000 [IU]/h | INTRAMUSCULAR | Status: DC
  Administered 2015-06-23: 950 [IU]/h via INTRAVENOUS
  Filled 2015-06-23: qty 250

## 2015-06-23 MED ORDER — CLOPIDOGREL BISULFATE 75 MG PO TABS: 75.0000 mg | ORAL_TABLET | Freq: Every day | ORAL | Status: DC

## 2015-06-23 MED ORDER — HEPARIN SODIUM (PORCINE) 1000 UNIT/ML IJ SOLN
INTRAMUSCULAR | Status: DC | PRN
  Administered 2015-06-23: 4000 [IU] via INTRAVENOUS

## 2015-06-23 MED ORDER — SODIUM CHLORIDE 0.9% FLUSH: 3.0000 mL | INTRAVENOUS | Status: DC | PRN

## 2015-06-23 MED ORDER — FENTANYL CITRATE (PF) 100 MCG/2ML IJ SOLN
INTRAMUSCULAR | Status: DC | PRN
  Administered 2015-06-23: 50 ug via INTRAVENOUS

## 2015-06-23 MED ORDER — MIDAZOLAM HCL 2 MG/2ML IJ SOLN
INTRAMUSCULAR | Status: DC | PRN
  Administered 2015-06-23: 2 mg via INTRAVENOUS

## 2015-06-23 MED ORDER — AMLODIPINE BESYLATE 10 MG PO TABS
10.0000 mg | ORAL_TABLET | Freq: Every day | ORAL | Status: DC
  Administered 2015-06-23 – 2015-06-24 (×2): 10 mg via ORAL
  Filled 2015-06-23 (×2): qty 1

## 2015-06-23 MED ORDER — ASPIRIN 81 MG PO CHEW: 324.0000 mg | CHEWABLE_TABLET | ORAL | Status: DC

## 2015-06-23 MED ORDER — HEPARIN (PORCINE) IN NACL 2-0.9 UNIT/ML-% IJ SOLN
INTRAMUSCULAR | Status: DC | PRN
  Administered 2015-06-23: 16:00:00

## 2015-06-23 MED ORDER — SODIUM CHLORIDE 0.9% FLUSH
3.0000 mL | Freq: Two times a day (BID) | INTRAVENOUS | Status: DC
  Administered 2015-06-24: 3 mL via INTRAVENOUS

## 2015-06-23 MED ORDER — ASPIRIN EC 81 MG PO TBEC
81.0000 mg | DELAYED_RELEASE_TABLET | Freq: Every day | ORAL | Status: DC
  Administered 2015-06-24: 81 mg via ORAL
  Filled 2015-06-23 (×2): qty 1

## 2015-06-23 MED ORDER — SODIUM CHLORIDE 0.9% FLUSH
3.0000 mL | Freq: Two times a day (BID) | INTRAVENOUS | Status: DC
  Administered 2015-06-23 – 2015-06-24 (×2): 3 mL via INTRAVENOUS

## 2015-06-23 MED ORDER — METOPROLOL SUCCINATE ER 25 MG PO TB24
25.0000 mg | ORAL_TABLET | Freq: Every day | ORAL | Status: DC
  Administered 2015-06-23 – 2015-06-24 (×2): 25 mg via ORAL
  Filled 2015-06-23 (×2): qty 1

## 2015-06-23 MED ORDER — ONDANSETRON HCL 4 MG/2ML IJ SOLN: 4.0000 mg | Freq: Four times a day (QID) | INTRAMUSCULAR | Status: DC | PRN

## 2015-06-23 MED ORDER — ASPIRIN 81 MG PO CHEW
324.0000 mg | CHEWABLE_TABLET | Freq: Once | ORAL | Status: AC
  Administered 2015-06-23: 324 mg via ORAL
  Filled 2015-06-23: qty 4

## 2015-06-23 MED ORDER — LIDOCAINE HCL (PF) 1 % IJ SOLN
INTRAMUSCULAR | Status: AC
  Filled 2015-06-23: qty 30

## 2015-06-23 MED ORDER — VERAPAMIL HCL 2.5 MG/ML IV SOLN
INTRAVENOUS | Status: AC
  Filled 2015-06-23: qty 2

## 2015-06-23 MED ORDER — FENTANYL CITRATE (PF) 100 MCG/2ML IJ SOLN
INTRAMUSCULAR | Status: AC
  Filled 2015-06-23: qty 2

## 2015-06-23 MED ORDER — HEPARIN SODIUM (PORCINE) 1000 UNIT/ML IJ SOLN
INTRAMUSCULAR | Status: AC
  Filled 2015-06-23: qty 1

## 2015-06-23 MED ORDER — NITROGLYCERIN 0.4 MG SL SUBL: 0.4000 mg | SUBLINGUAL_TABLET | SUBLINGUAL | Status: DC | PRN

## 2015-06-23 MED ORDER — SODIUM CHLORIDE 0.9 % IV SOLN
INTRAVENOUS | Status: AC
Start: 2015-06-23 — End: 2015-06-24

## 2015-06-23 MED ORDER — SODIUM CHLORIDE 0.9 % WEIGHT BASED INFUSION
3.0000 mL/kg/h | INTRAVENOUS | Status: DC
  Administered 2015-06-23: 3 mL/kg/h via INTRAVENOUS

## 2015-06-23 MED ORDER — VERAPAMIL HCL 2.5 MG/ML IV SOLN
INTRAVENOUS | Status: DC | PRN
  Administered 2015-06-23: 16:00:00 via INTRA_ARTERIAL

## 2015-06-23 MED ORDER — ATORVASTATIN CALCIUM 80 MG PO TABS
80.0000 mg | ORAL_TABLET | Freq: Every day | ORAL | Status: DC
  Administered 2015-06-23: 80 mg via ORAL
  Filled 2015-06-23: qty 1

## 2015-06-23 MED ORDER — NITROGLYCERIN IN D5W 200-5 MCG/ML-% IV SOLN
0.0000 ug/min | Freq: Once | INTRAVENOUS | Status: AC
  Administered 2015-06-23: 10 ug/min via INTRAVENOUS
  Filled 2015-06-23: qty 250

## 2015-06-23 SURGICAL SUPPLY — 11 items
CATH INFINITI 5 FR JL3.5 (CATHETERS) ×1 IMPLANT
CATH INFINITI 5FR ANG PIGTAIL (CATHETERS) ×1 IMPLANT
CATH INFINITI JR4 5F (CATHETERS) ×1 IMPLANT
DEVICE RAD COMP TR BAND LRG (VASCULAR PRODUCTS) ×2 IMPLANT
GLIDESHEATH SLEND SS 6F .021 (SHEATH) ×1 IMPLANT
KIT HEART LEFT (KITS) ×2 IMPLANT
PACK CARDIAC CATHETERIZATION (CUSTOM PROCEDURE TRAY) ×2 IMPLANT
SYR MEDRAD MARK V 150ML (SYRINGE) ×2 IMPLANT
TRANSDUCER W/STOPCOCK (MISCELLANEOUS) ×2 IMPLANT
TUBING CIL FLEX 10 FLL-RA (TUBING) ×2 IMPLANT
WIRE SAFE-T 1.5MM-J .035X260CM (WIRE) ×1 IMPLANT

## 2015-06-23 NOTE — ED Notes (Signed)
Pt states that he has been having gen abd pain that radiates to his chest since yesterday.  Pt states that he has been having "a little bit" of diarrhea and denies vomiting.

## 2015-06-23 NOTE — H&P (Signed)
Jimmy Parker is an 45 y.o. male.    Chief Complaint: chest pain Primary Cardiologist: seen by Meda Coffee 4/15 HPI: Jimmy Parker is a 45 yo man with PMH of tobacco abuse, T2DM, hypertension, dyslipidemia, family history of CAD who presents with intermittent chest discomfort in substernal/upper abdomen with some radiation to upper extremities bilaterally for the last 1-2 days. On presentation, troponin found to be 5 with resolution of chest discomfort leading to cardiology admission. He was last seen April 2015 at [facility] with negative/normal MPI stress test.    He started having chest pain Saturday night after work.   Had cp all day Sunday and then came to the [facility] ER Sunday night   The intense pain has improved but he has a dull pain / ache that has persisted.  Still has a dull ache Radiation out to arms.  Some increased shortness of breath  Not pleuretic cp , No diaphorsiss   NTG has helped some ,      Inpatient Medications  Past Medical History  Diagnosis Date  . Hypertension   . Diabetes mellitus   . Poor historian     Past Surgical History  Procedure Laterality Date  . Shoulder surgery      Right: Patient denies any hx of shoulder surgery  . Fracture surgery      femur and hip    No family history on file. Social History:  reports that he has been smoking.  He does not have any smokeless tobacco history on file. He reports that he drinks alcohol. He reports that he does not use illicit drugs.  Allergies: No Known Allergies   (Not in a hospital admission)  Results for orders placed or performed during the hospital encounter of 06/22/15 (from the past 48 hour(s))  Lipase, blood     Status: Abnormal   Collection Time: 06/23/15 12:11 AM  Result Value Ref Range   Lipase 125 (H) 11 - 51 U/L  Comprehensive metabolic panel     Status: Abnormal   Collection Time: 06/23/15 12:11 AM  Result Value Ref Range   Sodium 140 135 - 145 mmol/L   Potassium 3.7 3.5 - 5.1 mmol/L   Chloride 103 101 - 111 mmol/L   CO2 26 22 - 32 mmol/L   Glucose, Bld 138 (H) 65 - 99 mg/dL   BUN 12 6 - 20 mg/dL   Creatinine, Ser 0.90 0.61 - 1.24 mg/dL   Calcium 9.9 8.9 - 10.3 mg/dL   Total Protein 7.0 6.5 - 8.1 g/dL   Albumin 4.1 3.5 - 5.0 g/dL   AST 64 (H) 15 - 41 U/L   ALT 21 17 - 63 U/L   Alkaline Phosphatase 90 38 - 126 U/L   Total Bilirubin 0.7 0.3 - 1.2 mg/dL   GFR calc non Af Amer >60 >60 mL/min   GFR calc Af Amer >60 >60 mL/min    Comment: (NOTE) The eGFR has been calculated using the CKD EPI equation. This calculation has not been validated in all clinical situations. eGFR's persistently <60 mL/min signify possible Chronic Kidney Disease.    Anion gap 11 5 - 15  CBC     Status: Abnormal   Collection Time: 06/23/15 12:11 AM  Result Value Ref Range   WBC 11.7 (H) 4.0 - 10.5 K/uL   RBC 5.40 4.22 - 5.81 MIL/uL   Hemoglobin 15.4 13.0 - 17.0 g/dL   HCT 45.8 39.0 - 52.0 %   MCV 84.8 78.0 -  100.0 fL   MCH 28.5 26.0 - 34.0 pg   MCHC 33.6 30.0 - 36.0 g/dL   RDW 13.0 11.5 - 15.5 %   Platelets 219 150 - 400 K/uL  I-stat troponin, ED (not at [facility])     Status: Abnormal   Collection Time: 06/23/15 12:21 AM  Result Value Ref Range   Troponin i, poc 5.06 (HH) 0.00 - 0.08 ng/mL   Comment NOTIFIED PHYSICIAN    Comment 3            Comment: Due to the release kinetics of cTnI, a negative result within the first hours of the onset of symptoms does not rule out myocardial infarction with certainty. If myocardial infarction is still suspected, repeat the test at appropriate intervals.    Dg Chest 2 View  06/23/2015  CLINICAL DATA:  Chest pain EXAM: CHEST  2 VIEW COMPARISON:  08/03/2013 FINDINGS: Normal heart size and mediastinal contours. No acute infiltrate or edema. No effusion or pneumothorax. Incomplete segmentation of the left upper ribs. No acute osseous findings. IMPRESSION: No active cardiopulmonary disease. Electronically Signed   By: Monte Fantasia M.D.   On:  06/23/2015 01:04    ROS  Blood pressure 134/79, pulse 74, temperature 98.7 F (37.1 C), temperature source Oral, resp. rate 18, height 5' 6.14" (1.68 m), weight 78.563 kg (173 lb 3.2 oz), SpO2 100 %. Physical Exam   HEENT:   Normal , no JVD Atraumatic Neck normal  Lungs clear Cor:   RR, no rub, murmurs Abd:  Soft, nontender Ext no c/c/e    Neuro :  CN 2-12 intact.   Motor and sensory intact.  Speech is normal  Psych:  Normal    Assessment/Plan Jimmy Parker is a 45 yo man with PMH of tobacco abuse, T2DM, hypertension, dyslipidemia, family history of CAD who presents with intermittent chest discomfort and found to have abnormal EKG and elevated troponin. Currently CP free. Differential diagnosis is musculoskeletal pain, esophageal spasm, GERD, aortic dissection, pericarditis, ACS/NSTEMI among other etiologies. I favor a diagnosis of NSTEMI. Will start heparin, continue aspirin, admit to telemetry and watch closely for any changes in symptoms, low threshold to active cath lab.    It appears that his MI was 2 days ago and I suspect that his pain is due to post MI pain . He may have a partially open vessel so I think we do need to cath him  We have discussed the risks, benefits , options. He understands and agrees to proceed.   DM:   Continue meds    Jimmy Husbands, MD 06/23/2015, 1:28 AM

## 2015-06-23 NOTE — Interval H&P Note (Signed)
History and Physical Interval Note:  06/23/2015 3:20 PM  Jimmy Parker  has presented today for cardiac cath with the diagnosis of NSTEMI. The various methods of treatment have been discussed with the patient and family. After consideration of risks, benefits and other options for treatment, the patient has consented to  Procedure(s): Left Heart Cath and Coronary Angiography (N/A) as a surgical intervention .  The patient's history has been reviewed, patient examined, no change in status, stable for surgery.  I have reviewed the patient's chart and labs.  Questions were answered to the patient's satisfaction.     Ryiah Bellissimo

## 2015-06-23 NOTE — ED Provider Notes (Signed)
CSN: [id number]     Arrival date & time 06/22/15  2354 History   By signing my name below, I, Linus GalasMaharshi Patel, attest that this documentation has been prepared under the direction and in the presence of Raeford RazorStephen Ellyson Rarick, MD. Electronically Signed: Linus GalasMaharshi Patel, ED Scribe. 06/23/2015. 12:51 AM.   Chief Complaint  Patient presents with  . Chest Pain  . Abdominal Pain   The history is provided by the patient. No language interpreter was used.   HPI Comments: Jimmy Parker is a [age] y.o. male w/ hx of tobacco abuse, DM, HTN, HL, FHx of premature CAD who presents to the Emergency Department complaining of intermittent achy chest pain that began 1 day ago while at work. Pt also reports diaphoresis, bilateral arm numbness and abdominal pain. Pt reports each episodes last 15 minutes and then is relieved spontaneously.  Pt denies pain with exertion. Pt currently is asymptomatic. Pt denies any fevers, SOB, nausea, vomiting, or any other symptoms at this time.  44yM  Admission in 2015 with chest pain.  Enzymes  remained negative. Echo then demonstrated normal LV function and no WMA. He underwent inpatient Myoview which was negative for ischemia.    Past Medical History  Diagnosis Date  . Hypertension   . Diabetes mellitus   . Poor historian    Past Surgical History  Procedure Laterality Date  . Shoulder surgery      Right: Patient denies any hx of shoulder surgery  . Fracture surgery      femur and hip   No family history on file. Social History  Substance Use Topics  . Smoking status: Current Every Day Smoker  . Smokeless tobacco: None  . Alcohol Use: Yes    Review of Systems  Constitutional: Positive for diaphoresis.  Respiratory: Negative for shortness of breath.   Cardiovascular: Positive for chest pain.  Gastrointestinal: Positive for abdominal pain. Negative for nausea and vomiting.  Neurological: Positive for numbness.  All other systems reviewed and are negative.  Allergies   Review of patient's allergies indicates no known allergies.  Home Medications   Prior to Admission medications   Medication Sig Start Date End Date Taking? Authorizing Provider  amLODipine (NORVASC) 10 MG tablet Take 1 tablet (10 mg total) by mouth daily. 08/27/13   Beatrice LecherScott T Weaver, PA-C  aspirin EC 81 MG EC tablet Take 1 tablet (81 mg total) by mouth daily. 08/05/13   Zannie CovePreetha Joseph, MD  atorvastatin (LIPITOR) 40 MG tablet Take 1 tablet (40 mg total) by mouth daily at 6 PM. 08/27/13   Beatrice LecherScott T Weaver, PA-C  metoprolol succinate (TOPROL-XL) 25 MG 24 hr tablet Take 1 tablet (25 mg total) by mouth daily. 08/27/13   Beatrice LecherScott T Weaver, PA-C  nicotine (NICODERM CQ - DOSED IN MG/24 HOURS) 21 mg/24hr patch Place 1 patch (21 mg total) onto the skin daily. 08/05/13   Zannie CovePreetha Joseph, MD  spironolactone (ALDACTONE) 25 MG tablet Take 1 tablet (25 mg total) by mouth daily. 10/01/13   Jake BatheMark C Skains, MD  tetrahydrozoline (VISINE) 0.05 % ophthalmic solution Place 2 drops into both eyes daily as needed (for dry eyes).    Historical Provider, MD   BP 134/79 mmHg  Pulse 74  Temp(Src) 98.7 F (37.1 C) (Oral)  Resp 18  SpO2 100%   Physical Exam  Constitutional: He appears well-developed and well-nourished. No distress.  HENT:  Head: Normocephalic and atraumatic.  Right Ear: External ear normal.  Left Ear: External ear normal.  Eyes:  Conjunctivae are normal. Right eye exhibits no discharge. Left eye exhibits no discharge. No scleral icterus.  Neck: Neck supple. No tracheal deviation present.  Cardiovascular: Normal rate, regular rhythm and intact distal pulses.   Pulmonary/Chest: Effort normal and breath sounds normal. No stridor. No respiratory distress. He has no wheezes. He has no rales.  Abdominal: Soft. Bowel sounds are normal. He exhibits no distension. There is no tenderness. There is no rebound and no guarding.  Musculoskeletal: He exhibits no edema or tenderness.  Lower extremities symmetric as compared  to each other. No calf tenderness. Negative Homan's. No palpable cords.   Neurological: He is alert. He has normal strength. No cranial nerve deficit (no facial droop, extraocular movements intact, no slurred speech) or sensory deficit. He exhibits normal muscle tone. He displays no seizure activity. Coordination normal.  Skin: Skin is warm and dry. No rash noted.  Psychiatric: He has a normal mood and affect.  Nursing note and vitals reviewed.   ED Course  Procedures   CRITICAL CARE Performed by: Raeford Razor Total critical care time: 35 minutes Critical care time was exclusive of separately billable procedures and treating other patients. Critical care was necessary to treat or prevent imminent or life-threatening deterioration. Critical care was time spent personally by me on the following activities: development of treatment plan with patient and/or surrogate as well as nursing, discussions with consultants, evaluation of patient's response to treatment, examination of patient, obtaining history from patient or surrogate, ordering and performing treatments and interventions, ordering and review of laboratory studies, ordering and review of radiographic studies, pulse oximetry and re-evaluation of patient's condition.   DIAGNOSTIC STUDIES: Oxygen Saturation is 100% on room air, normal by my interpretation.    COORDINATION OF CARE: 12:41 AM Will order CXR, EKG, blood work and urinalysis. Will give aspirin. Discussed treatment plan with pt at bedside and pt agreed to plan.   Labs Review Labs Reviewed  LIPASE, BLOOD - Abnormal; Notable for the following:    Lipase 125 (*)    All other components within normal limits  COMPREHENSIVE METABOLIC PANEL - Abnormal; Notable for the following:    Glucose, Bld 138 (*)    AST 64 (*)    All other components within normal limits  CBC - Abnormal; Notable for the following:    WBC 11.7 (*)    All other components within normal limits   TROPONIN I - Abnormal; Notable for the following:    Troponin I 6.04 (*)    All other components within normal limits  I-STAT TROPOININ, ED - Abnormal; Notable for the following:    Troponin i, poc 5.06 (*)    All other components within normal limits  APTT  PROTIME-INR  URINALYSIS, ROUTINE W REFLEX MICROSCOPIC (NOT AT [facility])  HEPARIN LEVEL (UNFRACTIONATED)    Imaging Review Dg Chest 2 View  06/23/2015  CLINICAL DATA:  Chest pain EXAM: CHEST  2 VIEW COMPARISON:  08/03/2013 FINDINGS: Normal heart size and mediastinal contours. No acute infiltrate or edema. No effusion or pneumothorax. Incomplete segmentation of the left upper ribs. No acute osseous findings. IMPRESSION: No active cardiopulmonary disease. Electronically Signed   By: Marnee Spring M.D.   On: 06/23/2015 01:04   I have personally reviewed and evaluated these images and lab results as part of my medical decision-making.   EKG Interpretation   Date/Time:  Monday June 23 2015 00:08:44 EDT Ventricular Rate:  63 PR Interval:  129 QRS Duration: 93 QT Interval:  398 QTC  Calculation: 407 R Axis:   16 Text Interpretation:  Sinus rhythm Left ventricular hypertrophy TWI  inferiorly as noted on previous New TWI v5/6 Confirmed by Juleen China  MD,  Lindalou Soltis ([id number]) on 06/23/2015 12:13:39 AM      MDM   Final diagnoses:  NSTEMI (non-ST elevated myocardial infarction) (HCC)    44yM with NSTEMI. Currently pain free. ASA/heparin. Will discuss with cardiology. Anticipate transfer to [facility].   3:21 AM   Pt now with CP. Nitro ordered. Discussed again with Dr Tresa Endo. Unfortunately, currently no appropriate inpatient beds available. More appropriate for NSTEMI patient with active CP to be [facility] than at [facility]. Will transfer to [facility] ED.   Told by [facility] ED nursing that [facility] charge nurse is refusing transfer to [facility]. [facility] holding 18 patients and will not accomodate.  I had repeat discussions with cardiology and also EDP at [facility]. All in agreement  that patient needs to be at [facility].  I spoke with charge nurse and explained that this is a patient that needs transferred regardless of bed status.  Told that AC involved and should be getting him an ICU bed.   I personally preformed the services scribed in my presence. The recorded information has been reviewed is accurate. Raeford Razor, MD.     Raeford Razor, MD 06/25/15 [phone]

## 2015-06-23 NOTE — Progress Notes (Signed)
ANTICOAGULATION CONSULT NOTE - Initial Consult  Pharmacy Consult for Heparin Indication: chest pain/ACS  No Known Allergies  Patient Measurements: Height: 5' 6.14" (168 cm) Weight: 173 lb 3.2 oz (78.563 kg) IBW/kg (Calculated) : 64.13 Heparin Dosing Weight: actual body weight  Vital Signs: Temp: 98.7 F (37.1 C) (03/13 0000) Temp Source: Oral (03/13 0000) BP: 134/79 mmHg (03/13 0000) Pulse Rate: 74 (03/13 0000)  Labs:  Recent Labs  06/23/15 0011  HGB 15.4  HCT 45.8  PLT 219    CrCl cannot be calculated (Patient has no serum creatinine result on file.).   Medical History: Past Medical History  Diagnosis Date  . Hypertension   . Diabetes mellitus   . Poor historian     Medications:  Scheduled:  . aspirin  324 mg Oral Once  . heparin  4,000 Units Intravenous Once   Infusions:  . heparin      Assessment:  [age] yr male presents with c/o abdominal pain radiating to chest.  Elevated troponin.  Pharmacy consulted to dose IV heparin for ACS/STEMI  Patient on no oral anticoagulation PTA  Goal of Therapy:  Heparin level 0.3-0.7 units/ml Monitor platelets by anticoagulation protocol: Yes   Plan:   Obtain baseline aPTT and PT/INR stat  Heparin 4000 unit IV bolus x 1 followed by IV heparin infusion @ 950 units/hr  Check heparin level 6 hr after heparin started  Follow heparin level & CBC daily  Raileigh Sabater, Joselyn GlassmanLeann Trefz, PharmD 06/23/2015,12:59 AM

## 2015-06-23 NOTE — ED Notes (Signed)
Pt frustrated that he can't eat or drink. This RN explained that he needs to go to cath lab and can't eat or drink before hand. Pt sts "They've been telling me that since Midnight and I haven't had anything to eat for two days. I need to see the Doctor, get him in here."

## 2015-06-23 NOTE — ED Provider Notes (Signed)
Just received a phone call from cardiology. They noted an elevated troponin of 6 overnight. [facility] Pt on nitro and heparin. Chest pain free currently.  Will transfer to ED, awaiting opening in cath lab.    Glynn Yepes Randall AnLyn Debroh Sieloff, MD 06/23/15 [phone]

## 2015-06-23 NOTE — Care Management Note (Signed)
Case Management Note  Patient Details  Name: Jimmy Parker MRN: [id number] Date of Birth: 11/17/1970  Subjective/Objective:    Adm w nstemi                Action/Plan:lives at home   Expected Discharge Date:                  Expected Discharge Plan:  Home/Self Care  In-House Referral:     Discharge planning Services     Post Acute Care Choice:    Choice offered to:     DME Arranged:    DME Agency:     HH Arranged:    HH Agency:     Status of Service:     Medicare Important Message Given:    Date Medicare IM Given:    Medicare IM give by:    Date Additional Medicare IM Given:    Additional Medicare Important Message give by:     If discussed at Long Length of Stay Meetings, dates discussed:    Additional Comments:ur review done  Hanley HaysDowell, Lyllian Gause T, RN 06/23/2015, 10:57 AM

## 2015-06-23 NOTE — ED Notes (Addendum)
Pfeiffer, EDP made aware of patient troponin result.

## 2015-06-23 NOTE — Progress Notes (Signed)
ANTICOAGULATION CONSULT NOTE - Follow Up Consult  Pharmacy Consult for Heparin Indication: chest pain/ACS  No Known Allergies  Patient Measurements: Height: 5' 6.14" (168 cm) Weight: 173 lb 3.2 oz (78.563 kg) IBW/kg (Calculated) : 64.13 Heparin Dosing Weight: actual body weight  Vital Signs: Temp: 98.7 F (37.1 C) (03/13 0000) Temp Source: Oral (03/13 0000) BP: 105/63 mmHg (03/13 0700) Pulse Rate: 67 (03/13 0704)  Labs:  Recent Labs  06/23/15 0011 06/23/15 0056 06/23/15 0747  HGB 15.4  --   --   HCT 45.8  --   --   PLT 219  --   --   APTT  --  29  --   LABPROT  --  13.4  --   INR  --  1.00  --   HEPARINUNFRC  --   --  0.25*  CREATININE 0.90  --   --   TROPONINI  --  6.04*  --     Estimated Creatinine Clearance: 101.4 mL/min (by C-G formula based on Cr of 0.9).   Medications:  Scheduled:   Infusions:  . heparin 950 Units/hr (06/23/15 0108)   PRN:   Assessment:  [age] yr male presents with c/o abdominal pain radiating to chest. Elevated troponin.  Pharmacy consulted to dose IV heparin for ACS/STEMI  Patient on no oral anticoagulation PTA  First heparin level subtherapeutic (0.25) after 4000 units IV bolus and 950 units/hr.  Patient [facility]  Goal of Therapy:  Heparin level 0.3-0.7 units/ml Monitor platelets by anticoagulation protocol: Yes   Plan:   Increase heparin to 1000 units/hr  F/u anticoagulant plans s/p cath  Loralee PacasErin Derald Lorge, PharmD, BCPS Pager: [phone] 06/23/2015,8:14 AM

## 2015-06-24 ENCOUNTER — Encounter (HOSPITAL_COMMUNITY): Payer: Self-pay | Admitting: Cardiovascular Disease

## 2015-06-24 ENCOUNTER — Other Ambulatory Visit (HOSPITAL_COMMUNITY): Payer: Self-pay

## 2015-06-24 DIAGNOSIS — I2510 Atherosclerotic heart disease of native coronary artery without angina pectoris: Secondary | ICD-10-CM

## 2015-06-24 HISTORY — DX: Atherosclerotic heart disease of native coronary artery without angina pectoris: I25.10

## 2015-06-24 LAB — BASIC METABOLIC PANEL
ANION GAP: 9 (ref 5–15)
BUN: 8 mg/dL (ref 6–20)
CALCIUM: 9 mg/dL (ref 8.9–10.3)
CHLORIDE: 104 mmol/L (ref 101–111)
CO2: 25 mmol/L (ref 22–32)
Creatinine, Ser: 0.89 mg/dL (ref 0.61–1.24)
GFR calc Af Amer: >60 mL/min (ref >60)
GFR calc non Af Amer: >60 mL/min (ref >60)
GLUCOSE: 96 mg/dL (ref 65–99)
Potassium: 3.5 mmol/L (ref 3.5–5.1)
Sodium: 138 mmol/L (ref 135–145)

## 2015-06-24 LAB — CBC
HEMATOCRIT: 45.2 % (ref 39.0–52.0)
HEMOGLOBIN: 14.7 g/dL (ref 13.0–17.0)
MCH: 28.3 pg (ref 26.0–34.0)
MCHC: 32.5 g/dL (ref 30.0–36.0)
MCV: 86.9 fL (ref 78.0–100.0)
Platelets: 193 10*3/uL (ref 150–400)
RBC: 5.2 MIL/uL (ref 4.22–5.81)
RDW: 13 % (ref 11.5–15.5)
WBC: 11.3 10*3/uL — ABNORMAL HIGH (ref 4.0–10.5)

## 2015-06-24 LAB — LIPID PANEL
CHOL/HDL RATIO: 2.9 ratio
Cholesterol: 116 mg/dL (ref 0–200)
HDL: 40 mg/dL — AB (ref >40)
LDL CALC: 61 mg/dL (ref 0–99)
TRIGLYCERIDES: 75 mg/dL (ref <150)
VLDL: 15 mg/dL (ref 0–40)

## 2015-06-24 LAB — HEMOGLOBIN A1C
Hgb A1c MFr Bld: 6 % — ABNORMAL HIGH (ref 4.8–5.6)
Mean Plasma Glucose: 126 mg/dL

## 2015-06-24 MED ORDER — ATORVASTATIN CALCIUM 80 MG PO TABS: 80.0000 mg | ORAL_TABLET | Freq: Every day | ORAL | Status: DC

## 2015-06-24 MED ORDER — ACETAMINOPHEN 325 MG PO TABS: 650.0000 mg | ORAL_TABLET | ORAL | Status: DC | PRN

## 2015-06-24 MED ORDER — SPIRONOLACTONE 25 MG PO TABS: 25.0000 mg | ORAL_TABLET | Freq: Every day | ORAL | Status: DC

## 2015-06-24 MED ORDER — NITROGLYCERIN 0.4 MG SL SUBL: 0.4000 mg | SUBLINGUAL_TABLET | SUBLINGUAL | Status: DC | PRN

## 2015-06-24 MED ORDER — ASPIRIN 81 MG PO TBEC: 81.0000 mg | DELAYED_RELEASE_TABLET | Freq: Every day | ORAL | Status: DC

## 2015-06-24 MED ORDER — METOPROLOL SUCCINATE ER 25 MG PO TB24: 25.0000 mg | ORAL_TABLET | Freq: Every day | ORAL | Status: DC

## 2015-06-24 MED ORDER — AMLODIPINE BESYLATE 10 MG PO TABS: 10.0000 mg | ORAL_TABLET | Freq: Every day | ORAL | Status: DC

## 2015-06-24 NOTE — Discharge Instructions (Signed)
Heart Attack °A heart attack (myocardial infarction, MI) causes damage to the heart that cannot be fixed. A heart attack often happens when a blood clot or other blockage cuts blood flow to the heart. When this happens, certain areas of the heart begin to die. This causes the pain you feel during a heart attack. °HOME CARE °· Take medicine as told by your doctor. You may need medicine to: °¨ Keep your blood from clotting too easily. °¨ Control your blood pressure. °¨ Lower your cholesterol. °¨ Control abnormal heart rhythms. °· Change certain behaviors as told by your doctor. This may include: °¨ Quitting smoking. °¨ Being active. °¨ Eating a heart-healthy diet. Ask your doctor for help with this diet. °¨ Keeping a healthy weight. °¨ Keeping your diabetes under control. °¨ Lessening stress. °¨ Limiting how much alcohol you drink. °Do not take these medicines unless your doctor says that you can: °· Nonsteroidal anti-inflammatory drugs (NSAIDs). These include: °¨ Ibuprofen. °¨ Naproxen. °¨ Celecoxib. °· Vitamin supplements that have vitamin A, vitamin E, or both. °· Hormone therapy that contains estrogen with or without progestin. °GET HELP RIGHT AWAY IF: °· You have sudden chest discomfort. °· You have sudden discomfort in your: °¨ Arms. °¨ Back. °¨ Neck. °¨ Jaw. °· You have shortness of breath at any time. °· You have sudden sweating or clammy skin. °· You feel sick to your stomach (nauseous) or throw up (vomit). °· You suddenly get light-headed or dizzy. °· You feel your heart beating fast or skipping beats. °These symptoms may be an emergency. Do not wait to see if the symptoms will go away. Get medical help right away. Call your local emergency services (911 in the [place] Do not drive yourself to the hospital. °  °This information is not intended to replace advice given to you by your health care provider. Make sure you discuss any questions you have with your health care provider. °  °Document Released:  09/28/2011 Document Revised: 08/13/2014 Document Reviewed: 06/01/2013 °Elsevier Interactive Patient Education ©2016 Elsevier Inc. ° °

## 2015-06-24 NOTE — Progress Notes (Signed)
Patient being discharged home. Patient given discharge instructions/follow up appts. Patient educated on the need to take medications. Patient verbalized understanding. Patient being discharged home with family.

## 2015-06-24 NOTE — Discharge Summary (Signed)
Patient ID: Jimmy Parker,  MRN: [id number], DOB/AGE: 47/10/1970 45 y.o.  Admit date: 06/22/2015 Discharge date: 06/24/2015  Primary Care Provider: No primary care provider on file. Primary Cardiologist: Dr Delton SeeNelson  Discharge Diagnoses Principal Problem:   NSTEMI (non-ST elevated myocardial infarction) [facility](HCC) Active Problems:   Chest pain   CAD- occluded RCA with collaterals   Diabetes mellitus (HCC)   HYPERTENSION, BENIGN ESSENTIAL   TOBACCO ABUSE   Dyslipidemia    Procedures: Cath 06/23/15  Hospital Course [name] with PMH of tobacco abuse, T2DM, hypertension, dyslipidemia, and a family history of CAD who presented with intermittent chest pain 06/22/15. He had been having symptoms x 2 days. He has a history of previous low risk Myoview April 2015. His Troponin was elevated at 6.7. EKG shows inferior/ lateral TWI. Cath done 06/23/15 revealed an occluded mid RCA with collaterals from left system. He had moderate LVD at cath, echo is pending at discharge. Plan is for medical Rx. 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 He was ambulated on the 14th and felt to be stable for discharge.   Discharge Vitals:  Blood pressure 102/64, pulse 66, temperature 98.2 F (36.8 C), temperature source Oral, resp. rate 18, height 5\' 7"  (1.702 m), weight 170 lb 6.4 oz (77.293 kg), SpO2 97 %.    Labs: Results for orders placed or performed during the hospital encounter of 06/22/15 (from the past 24 hour(s))  CBC     Status: Abnormal   Collection Time: 06/24/15  2:10 AM  Result Value Ref Range   WBC 11.3 (H) 4.0 - 10.5 K/uL   RBC 5.20 4.22 - 5.81 MIL/uL   Hemoglobin 14.7 13.0 - 17.0 g/dL   HCT 40.945.2 81.139.0 - 91.452.0 %   MCV 86.9 78.0 - 100.0 fL   MCH 28.3 26.0 - 34.0 pg   MCHC 32.5 30.0 - 36.0 g/dL   RDW 78.213.0 95.611.5 - 21.315.5 %   Platelets 193 150 - 400 K/uL  Basic metabolic panel     Status: None   Collection Time: 06/24/15  2:10 AM  Result Value Ref Range     Sodium 138 135 - 145 mmol/L   Potassium 3.5 3.5 - 5.1 mmol/L   Chloride 104 101 - 111 mmol/L   CO2 25 22 - 32 mmol/L   Glucose, Bld 96 65 - 99 mg/dL   BUN 8 6 - 20 mg/dL   Creatinine, Ser 0.860.89 0.61 - 1.24 mg/dL   Calcium 9.0 8.9 - 57.810.3 mg/dL   GFR calc non Af Amer >60 >60 mL/min   GFR calc Af Amer >60 >60 mL/min   Anion gap 9 5 - 15  Lipid panel     Status: Abnormal   Collection Time: 06/24/15  2:10 AM  Result Value Ref Range   Cholesterol 116 0 - 200 mg/dL   Triglycerides 75 <469<150 mg/dL   HDL 40 (L) >62>40 mg/dL   Total CHOL/HDL Ratio 2.9 RATIO   VLDL 15 0 - 40 mg/dL   LDL Cholesterol 61 0 - 99 mg/dL    Disposition:    Discharge Medications:    Medication List    STOP taking these medications        PRESCRIPTION MEDICATION      TAKE these medications        acetaminophen 325 MG tablet  Commonly known as:  TYLENOL  Take 2 tablets (650  mg total) by mouth every 4 (four) hours as needed for headache or mild pain.     amLODipine 10 MG tablet  Commonly known as:  NORVASC  Take 1 tablet (10 mg total) by mouth daily.     aspirin 81 MG EC tablet  Take 1 tablet (81 mg total) by mouth daily.     atorvastatin 80 MG tablet  Commonly known as:  LIPITOR  Take 1 tablet (80 mg total) by mouth daily at 6 PM.     metoprolol succinate 25 MG 24 hr tablet  Commonly known as:  TOPROL-XL  Take 1 tablet (25 mg total) by mouth daily.     nitroGLYCERIN 0.4 MG SL tablet  Commonly known as:  NITROSTAT  Place 1 tablet (0.4 mg total) under the tongue every 5 (five) minutes x 3 doses as needed for chest pain.     spironolactone 25 MG tablet  Commonly known as:  ALDACTONE  Take 1 tablet (25 mg total) by mouth daily.         Duration of Discharge Encounter: Greater than 30 minutes including physician time.  Jolene Provost PA-C 06/24/2015 2:33 PM   Attending Note:   The patient was seen and examined.  Agree with assessment and plan as noted above.  Changes made to the  above note as needed.  Pt is doing well.  Medical therapy for his Inf. MI He presented 2 days after the onset of symptoms and the occluded RCA was not opened.  Lipid levels look good. Will recheck in several months   Advised him to stop smoking    Vesta Mixer, Montez Hageman., MD, [facility] 06/24/2015, 4:55 PM [address]  [address] Office [phone] Pager [phone]

## 2015-06-24 NOTE — Progress Notes (Signed)
CARDIAC REHAB PHASE I   PRE:  Rate/Rhythm: 74 SR    BP: sitting 115/76    SaO2:   MODE:  Ambulation: 60 ft   POST:  Rate/Rhythm: 87 SR    BP: sitting 126/75     SaO2:   Pt walked without CP however does endorse SOB with exertion. Ed completed. Pt seems to have strange outlook, not practical. Does not like taking meds (quit all meds 4 months ago), esp his diuretic. Feels his father ended up in w/c due to diuretic. Discussed the importance of all his meds and also f/u with cardiologist. He does not have insurance and wants to apply for disability. Discussed risk factor modifications, pt wants to quit smoking. Gave resources. Will send referral to [place] [facility]. He will need financial assist. [phone]   Harriet MassonRandi Kristan Zanden Colver CES, ACSM 06/24/2015 10:48 AM

## 2015-06-24 NOTE — Discharge Summary (Signed)
Discharge Summary    Patient ID: Jimmy Parker,  MRN: [id number], DOB/AGE: 47/10/1970 47 y.o.  Admit date: 06/22/2015 Discharge date: 06/24/2015  Primary Care Provider: No primary care provider on file. Primary Cardiologist: Dr Elease HashimotoNahser  Discharge Diagnoses    Principal Problem:   NSTEMI (non-ST elevated myocardial infarction) [facility](HCC) Active Problems:   Chest pain   CAD- occluded RCA with collaterals   Diabetes mellitus (HCC)   HYPERTENSION, BENIGN ESSENTIAL   TOBACCO ABUSE   Dyslipidemia   Allergies No Known Allergies  Diagnostic Studies/Procedures    Cath 06/23/15 _____________   History of Present Illness     45 y/o diabetic, hypertensive, smoker- on no medication- admitted with NSTEMI (late presentation) 06/23/15  Hospital Course      45 y.o. AA male, seen by Dr Delton SeeNelson in 2015 when he had an echo and Myoview for chest pain that was unremarkable. He has a hx of tobacco abuse, DM, HTN, HL, and FHx of premature CAD. He presented to the Emergency Department 06/23/15 complaining of intermittent achy chest pain that began 1 day prior while at work. Pt also reports diaphoresis, and bilateral arm numbness. His EKG was abnormal with inferior and lateral TWI. Troponin positive. He was admitted and started on Heparin and ASA. He was taken to the cath lab by Dr Sanjuana KavaMcAlhaney. Cath revealed total mid RCA with L-R collaterals and moderate LVD. Echo was done prior to discharge and is pending. The pt has ambulated and is anxious for discharge. 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 He admits he was taking no medications prior to admission and he was provided with new prescriptions. He will need to be a TOC f/u in 7-14 days.   Discharge Vitals Blood pressure 102/64, pulse 66, temperature 98.2 F (36.8 C), temperature source Oral, resp. rate 18, height 5\' 7"  (1.702 m), weight 170 lb 6.4 oz (77.293 kg), SpO2 97 %.  Filed Weights   06/23/15 0057 06/23/15 1000 06/24/15 0357  Weight: 173 lb 3.2 oz (78.563 kg) 168 lb 14 oz (76.6 kg) 170 lb 6.4 oz (77.293 kg)    Labs & Radiologic Studies     CBC  Recent Labs  06/23/15 0854 06/24/15 0210  WBC 11.9* 11.3*  HGB 14.7 14.7  HCT 44.2 45.2  MCV 85.0 86.9  PLT 195 193   Basic Metabolic Panel  Recent Labs  06/23/15 0854 06/23/15 1202 06/24/15 0210  NA 135 139 138  K 3.6 3.9 3.5  CL 104 104 104  CO2 23 26 25   GLUCOSE 118* 118* 96  BUN 11 7 8   CREATININE 0.78 0.81 0.89  CALCIUM 9.0 9.2 9.0  MG 1.7  --   --    Liver Function Tests  Recent Labs  06/23/15 0011 06/23/15 1202  AST 64* 53*  ALT 21 20  ALKPHOS 90 82  BILITOT 0.7 1.0  PROT 7.0 5.7*  ALBUMIN 4.1 3.4*    Recent Labs  06/23/15 0011  LIPASE 125*   Cardiac Enzymes  Recent Labs  06/23/15 0056 06/23/15 0854  TROPONINI 6.04* 6.70*   BNP Invalid input(s): POCBNP D-Dimer No results for input(s): DDIMER in the last 72 hours. Hemoglobin A1C  Recent Labs  06/23/15 1202  HGBA1C 6.0*   Fasting Lipid Panel  Recent Labs  06/24/15 0210  CHOL 116  HDL 40*  LDLCALC 61  TRIG 75  CHOLHDL 2.9  Thyroid Function Tests  Recent Labs  06/23/15 0854  TSH 1.880    Dg Chest 2 View  06/23/2015  CLINICAL DATA:  Chest pain EXAM: CHEST  2 VIEW COMPARISON:  08/03/2013 FINDINGS: Normal heart size and mediastinal contours. No acute infiltrate or edema. No effusion or pneumothorax. Incomplete segmentation of the left upper ribs. No acute osseous findings. IMPRESSION: No active cardiopulmonary disease. Electronically Signed   By: Marnee Spring M.D.   On: 06/23/2015 01:04    Disposition   Pt is being discharged home today in good condition.  Follow-up Plans & Appointments    Follow-up Information    Follow up with Lars Masson, MD.   Specialty:  Cardiology   Why:  office will contact you   Contact information:   [address] [place]  [address] [phone]      Discharge Instructions    Amb Referral to Cardiac Rehabilitation    Complete by:  As directed   Diagnosis:  Myocardial Infarction           Discharge Medications   Current Discharge Medication List    START taking these medications   Details  acetaminophen (TYLENOL) 325 MG tablet Take 2 tablets (650 mg total) by mouth every 4 (four) hours as needed for headache or mild pain.    aspirin EC 81 MG EC tablet Take 1 tablet (81 mg total) by mouth daily.    nitroGLYCERIN (NITROSTAT) 0.4 MG SL tablet Place 1 tablet (0.4 mg total) under the tongue every 5 (five) minutes x 3 doses as needed for chest pain. Qty: 25 tablet, Refills: 2      CONTINUE these medications which have CHANGED   Details  amLODipine (NORVASC) 10 MG tablet Take 1 tablet (10 mg total) by mouth daily. Qty: 90 tablet, Refills: 3    atorvastatin (LIPITOR) 80 MG tablet Take 1 tablet (80 mg total) by mouth daily at 6 PM. Qty: 90 tablet, Refills: 3    metoprolol succinate (TOPROL-XL) 25 MG 24 hr tablet Take 1 tablet (25 mg total) by mouth daily. Qty: 90 tablet, Refills: 3    spironolactone (ALDACTONE) 25 MG tablet Take 1 tablet (25 mg total) by mouth daily. Qty: 90 tablet, Refills: 3      STOP taking these medications     PRESCRIPTION MEDICATION          Aspirin prescribed at discharge?  Yes High Intensity Statin Prescribed? (Lipitor 40-80mg  or Crestor 20-40mg ): Yes Beta Blocker Prescribed? Yes For EF 45% or less, Was ACEI/ARB Prescribed? No: NA- echo pending ADP Receptor Inhibitor Prescribed? (i.e. Plavix etc.-Includes Medically Managed Patients): Yes For EF <40%, Aldosterone Inhibitor Prescribed? Yes Was EF assessed during THIS hospitalization? Yes Was Cardiac Rehab II ordered? (Included Medically managed Patients): Yes   Outstanding Labs/Studies    Echo  Duration of Discharge Encounter   Greater than 30 minutes including physician time.  Deland Pretty  PA   06/24/2015, 2:35 PM  See progress note Pt is ready for DC    Natalyia Innes, Deloris Ping, MD  06/24/2015 5:02 PM    [facility] [address] [place],  [address] [place], [place]  [address] Pager [phone] Phone: [phone]; Fax: [phone]   [facility]  [address] [place], [place]  [address] [phone]   Fax [phone]

## 2015-06-24 NOTE — Progress Notes (Signed)
PROGRESS NOTE  Subjective:   Jimmy Parker presented  Roughly 36 hours after the onset of CP  Found to have an occluded RCA with collateral filling The pain is more of a dull ache at this point .  The occlusion was not opened  Feeling better   Objective:    Vital Signs:   Temp:  [98.6 F (37 C)-100 F (37.8 C)] 98.8 F (37.1 C) (03/14 0740) Pulse Rate:  [65-86] 74 (03/14 0740) Resp:  [8-35] 18 (03/14 0740) BP: (102-124)/(52-88) 118/76 mmHg (03/14 0740) SpO2:  [92 %-100 %] 97 % (03/14 0740) Weight:  [168 lb 14 oz (76.6 kg)-170 lb 6.4 oz (77.293 kg)] 170 lb 6.4 oz (77.293 kg) (03/14 0357)      24-hour weight change: Weight change: -4 lb 5.2 oz (-1.963 kg)  Weight trends: Filed Weights   06/23/15 0057 06/23/15 1000 06/24/15 0357  Weight: 173 lb 3.2 oz (78.563 kg) 168 lb 14 oz (76.6 kg) 170 lb 6.4 oz (77.293 kg)    Intake/Output:  03/13 0701 - 03/14 0700 In: 898.9 [P.O.:240; I.V.:658.9] Out: 350 [Urine:350]     Physical Exam: BP 118/76 mmHg  Pulse 74  Temp(Src) 98.8 F (37.1 C) (Oral)  Resp 18  Ht 5\' 7"  (1.702 m)  Wt 170 lb 6.4 oz (77.293 kg)  BMI 26.68 kg/m2  SpO2 97%  Wt Readings from Last 3 Encounters:  06/24/15 170 lb 6.4 oz (77.293 kg)  08/27/13 180 lb 12.8 oz (82.01 kg)  08/05/13 177 lb 11.1 oz (80.6 kg)    General: Vital signs reviewed and noted.   Head: Normocephalic, atraumatic.  Eyes: conjunctivae/corneas clear.  EOM's intact.   Throat: normal  Neck:  normal   Lungs:    clear   Heart:  RR, normal S1S2  Abdomen:  Soft, non-tender, non-distended    Extremities: Right radial cath site looks good    Neurologic: A&O X3, CN II - XII are grossly intact.   Psych: Normal    Labs: BMET:  Recent Labs  06/23/15 0854 06/23/15 1202 06/24/15 0210  NA 135 139 138  K 3.6 3.9 3.5  CL 104 104 104  CO2 23 26 25   GLUCOSE 118* 118* 96  BUN 11 7 8   CREATININE 0.78 0.81 0.89  CALCIUM 9.0 9.2 9.0  MG 1.7  --   --     Liver function  tests:  Recent Labs  06/23/15 0011 06/23/15 1202  AST 64* 53*  ALT 21 20  ALKPHOS 90 82  BILITOT 0.7 1.0  PROT 7.0 5.7*  ALBUMIN 4.1 3.4*    Recent Labs  06/23/15 0011  LIPASE 125*    CBC:  Recent Labs  06/23/15 0854 06/24/15 0210  WBC 11.9* 11.3*  HGB 14.7 14.7  HCT 44.2 45.2  MCV 85.0 86.9  PLT 195 193    Cardiac Enzymes:  Recent Labs  06/23/15 0056 06/23/15 0854  TROPONINI 6.04* 6.70*    Coagulation Studies:  Recent Labs  06/23/15 0056 06/23/15 0854  LABPROT 13.4 13.6  INR 1.00 1.02    Other: Invalid input(s): POCBNP No results for input(s): DDIMER in the last 72 hours.  Recent Labs  06/23/15 1202  HGBA1C 6.0*    Recent Labs  06/23/15 0854  CHOL 128  HDL 35*  LDLCALC 74  TRIG 97  CHOLHDL 3.7    Recent Labs  06/23/15 0854  TSH 1.880   No results for input(s): VITAMINB12, FOLATE, FERRITIN, TIBC, IRON, RETICCTPCT in the  last 72 hours.   Other results:  Tele  ( personally reviewed )  -   NSR   Medications:    Infusions:    Scheduled Medications: . amLODipine  10 mg Oral Daily  . aspirin EC  81 mg Oral Daily  . atorvastatin  80 mg Oral q1800  . clopidogrel  75 mg Oral Daily  . metoprolol succinate  25 mg Oral Daily  . sodium chloride flush  3 mL Intravenous Q12H  . sodium chloride flush  3 mL Intravenous Q12H  . spironolactone  25 mg Oral Daily    Assessment/ Plan:   Active Problems:   Diabetes mellitus (HCC)   TOBACCO ABUSE   HYPERTENSION, BENIGN ESSENTIAL   Chest pain   NSTEMI (non-ST elevated myocardial infarction) (HCC)   Dyslipidemia  1. Inf. MI / CAD Presented late .  Cath shows completed occlusion of RCA with collateral filling. He is a smoker - advised him to stop Check lipid profile today HbA1C is normal  - 6.0   2. HTN - continue amlodipine 10, aldactone  He needs to re-establish with a primary medical doctor   Anticipate Dc today  Will ambulate with cardiac rehab. Will DC if he is  feeling well  DC plavix Continue ASA 81  Will need a note for work to have the rest of this week off.      Disposition: DC to home today if he remains stable Length of Stay: 1  Vesta Mixer, Montez Hageman., MD, [facility] 06/24/2015, 8:45 AM Office [phone] Pager [phone]

## 2015-06-24 NOTE — Care Management Note (Addendum)
Case Management Note  Patient Details  Name: Pollyann Savoyony L Almendarez MRN: [id number] Date of Birth: 06/24/1970  Subjective/Objective:   Pt states he has no PCP and is interested in getting established with [facility].  Provided contact information and pt will call for appointment.  Clinic will assist pt in orange card application.                               [facility]  Discharge planning Services  CM Consult  Delante Karapetyan, Chapman FitchHenrietta T, [place] 06/24/2015, 2:26 PM   TC to [facility] per pt request, appt made for orange card application on 3/29 @ 11:00 a.m.Marland Kitchen.  Provided pt with appointment information as well as orange card application.

## 2015-06-25 ENCOUNTER — Telehealth: Payer: Self-pay | Admitting: Cardiology

## 2015-06-25 NOTE — Telephone Encounter (Signed)
New message      Left msg on vm for TOC appt on 07-02-15 with [facility]

## 2015-06-25 NOTE — Telephone Encounter (Signed)
Left message to call back  

## 2015-06-26 ENCOUNTER — Telehealth (HOSPITAL_COMMUNITY): Payer: Self-pay | Admitting: *Deleted

## 2015-06-26 NOTE — Telephone Encounter (Signed)
Left message for patient to call the office between 8 am and 5 pm

## 2015-06-30 NOTE — Telephone Encounter (Signed)
Left another message for pt to cb

## 2015-07-01 NOTE — Telephone Encounter (Signed)
Left message to call back  

## 2015-07-02 ENCOUNTER — Encounter: Payer: Self-pay | Admitting: Cardiology

## 2015-07-02 NOTE — Telephone Encounter (Signed)
Unable to contact pt.  See he is scheduled to see Robbie LisBrittainy Simmons today at 2:30.

## 2015-07-04 ENCOUNTER — Encounter: Payer: Self-pay | Admitting: Nurse Practitioner

## 2015-07-04 ENCOUNTER — Ambulatory Visit (INDEPENDENT_AMBULATORY_CARE_PROVIDER_SITE_OTHER): Payer: Self-pay | Admitting: Nurse Practitioner

## 2015-07-04 VITALS — BP 128/60 | HR 86 | Ht 67.0 in | Wt 173.4 lb

## 2015-07-04 DIAGNOSIS — I119 Hypertensive heart disease without heart failure: Secondary | ICD-10-CM | POA: Insufficient documentation

## 2015-07-04 DIAGNOSIS — Z720 Tobacco use: Secondary | ICD-10-CM | POA: Insufficient documentation

## 2015-07-04 DIAGNOSIS — I2510 Atherosclerotic heart disease of native coronary artery without angina pectoris: Secondary | ICD-10-CM | POA: Insufficient documentation

## 2015-07-04 DIAGNOSIS — E785 Hyperlipidemia, unspecified: Secondary | ICD-10-CM

## 2015-07-04 DIAGNOSIS — I25119 Atherosclerotic heart disease of native coronary artery with unspecified angina pectoris: Secondary | ICD-10-CM

## 2015-07-04 DIAGNOSIS — E119 Type 2 diabetes mellitus without complications: Secondary | ICD-10-CM

## 2015-07-04 DIAGNOSIS — I214 Non-ST elevation (NSTEMI) myocardial infarction: Secondary | ICD-10-CM

## 2015-07-04 DIAGNOSIS — I255 Ischemic cardiomyopathy: Secondary | ICD-10-CM | POA: Insufficient documentation

## 2015-07-04 MED ORDER — ISOSORBIDE MONONITRATE ER 30 MG PO TB24: 15.0000 mg | ORAL_TABLET | Freq: Every day | ORAL | Status: DC

## 2015-07-04 NOTE — Patient Instructions (Signed)
Medication Instructions:  1) START Imdur 15mg  once daily 2) Hold your Lipitor for one week.  If your leg aches improve, restart your Lipitor at a half tab.  Call us and let us know if aches improve so we can update your medication list.  Labwork: CMET today  Testing/Procedures: Your physician has requested that you have an echocardiogram. Echocardiography is a painless test that uses sound waves to create images of your heart. It provides your doctor with information about the size and shape of your heart and how well your heart's chambers and valves are working. This procedure takes approximately one hour. There are no restrictions for this procedure.    Follow-Up: Your physician recommends that you schedule a follow-up appointment in: 1 month with Dr. Delton SeeNelson.   Any Other Special Instructions Will Be Listed Below (If Applicable).     If you need a refill on your cardiac medications before your next appointment, please call your pharmacy.

## 2015-07-04 NOTE — Progress Notes (Signed)
Office Visit    Patient Name: Jimmy Parker Date of Encounter: 07/04/2015  Primary Care Provider:  No primary care provider on file. Primary Cardiologist:  Eloy EndK. Nelson, MD   Chief Complaint     45 year old male status post recent non-ST segment elevation myocardial infarction who presents for follow-up.  Past Medical History    Past Medical History  Diagnosis Date  . Hypertensive heart disease   . Diet-controlled diabetes mellitus (HCC)   . CAD (coronary artery disease)     a. 07/2013 Neg MV;  b. 06/2015 NSTEMI/Cath:LM nl, LAD 1580m,  D1/2/3 nl, RI mod/nl, LCX 5780m, OM1 small, OM2 nl, RCA 20p, 16368m, RPDA fills via collats from OM3, EF 35-45%.  . Ischemic cardiomyopathy     a. 07/2013 Echo: EF 50-55%, no rwma, Gr1 DD;  b. 06/2015 EF 35-45% by Vgram.   . Hyperlipidemia   . Tobacco abuse     a. quit 06/2015.   Past Surgical History  Procedure Laterality Date  . Shoulder surgery      Right: Patient denies any hx of shoulder surgery  . Fracture surgery      femur and hip  . Cardiac catheterization N/A 06/23/2015    Procedure: Left Heart Cath and Coronary Angiography;  Surgeon: Kathleene Hazelhristopher D McAlhany, MD;  Location: [facility] CV LAB;  Service: Cardiovascular;  Laterality: N/A;    Allergies  No Known Allergies  History of Present Illness    45 year old male with the above past medical history. He was recently admitted to [facility] following a 2 day history of chest pain. Initial troponin was elevated at 5. He was chest pain-free after initial ER evaluation. 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 EF was 35-45%. Medical therapy was recommended. He was placed on aspirin, statin, beta blocker, and spironolactone therapy. He was subsequently discharged. Since discharge, he has had intermittent dyspnea exertion also mild chest discomfort. He is also noted aching in the backs of his legs. He was upset that  he did not get an adequate explanation of the events that occurred during his hospital physician. He had a misunderstanding about his anatomy and treatment. We discussed this at length today. He wishes he did not have to take medication and we also discussed the importance of what he is on and why he is on it. He has quit smoking. He denies PND, orthopnea, dizziness, syncope, edema, or early satiety.  Home Medications    Prior to Admission medications   Medication Sig Start Date End Date Taking? Authorizing Provider  acetaminophen (TYLENOL) 325 MG tablet Take 2 tablets (650 mg total) by mouth every 4 (four) hours as needed for headache or mild pain. 06/24/15  Yes Luke K Kilroy, PA-C  amLODipine (NORVASC) 10 MG tablet Take 1 tablet (10 mg total) by mouth daily. 06/24/15  Yes Abelino DerrickLuke K Kilroy, PA-C  aspirin EC 81 MG EC tablet Take 1 tablet (81 mg total) by mouth daily. 06/24/15  Yes Luke K Kilroy, PA-C  atorvastatin (LIPITOR) 80 MG tablet Take 1 tablet (80 mg total) by mouth daily at 6 PM. 06/24/15  Yes Abelino DerrickLuke K Kilroy, PA-C  metoprolol succinate (TOPROL-XL) 25 MG 24 hr tablet Take 1 tablet (25 mg total) by mouth daily. 06/24/15  Yes Luke K Kilroy, PA-C  nitroGLYCERIN (NITROSTAT) 0.4 MG SL tablet Place 1 tablet (0.4 mg total) under the tongue every 5 (five) minutes x 3 doses as needed  for chest pain. 06/24/15  Yes Abelino Derrick, PA-C  spironolactone (ALDACTONE) 25 MG tablet Take 1 tablet (25 mg total) by mouth daily. 06/24/15  Yes Luke K Kilroy, PA-C  isosorbide mononitrate (IMDUR) 30 MG 24 hr tablet Take 0.5 tablets (15 mg total) by mouth daily. 07/04/15   Ok Anis, NP    Review of Systems    As above, he has been having intermittent dyspnea on exertion also mild chest discomfort. His been having aching in his legs and also pain across his chest and into his abdomen when he coughs. Denies PND, orthopnea, dizziness, syncope, edema, or early satiety.  All other systems reviewed and are otherwise  negative except as noted above.  Physical Exam    VS:  BP 128/60 mmHg  Pulse 86  Ht  (1.702 m)  Wt 173 lb 6.4 oz (78.654 kg)  BMI 27.15 kg/m2 , BMI Body mass index is 27.15 kg/(m^2). GEN: Well nourished, well developed, in no acute distress. HEENT: normal. Neck: Supple, no JVD, carotid bruits, or masses. Cardiac: RRR, no murmurs, rubs, or gallops. No clubbing, cyanosis, edema.  Radials/DP/PT 2+ and equal bilaterally.  Right wrist catheterization site without bleeding, bruit, or hematoma. Respiratory:  Respirations regular and unlabored, clear to auscultation bilaterally. GI: Soft, nontender, nondistended, BS + x 4. MS: no deformity or atrophy. Skin: warm and dry, no rash. Neuro:  Strength and sensation are intact. Psych: Normal affect.  Accessory Clinical Findings    ECG - Regular sinus rhythm, 86, biatrial enlargement, inferior T-wave inversion, T-wave inversion in V6, LVH. No acute changes.  Assessment & Plan    1.  Non-ST segment elevation myocardial infarction, subsequent episode of care/CAD: Patient recently presented with a two-day history of chest pain and troponin of 5. Cath revealed total occlusion of the right coronary artery with left-to-right collaterals. Medical therapy was recommended. He has had some chest pain and dyspnea since his discharge. He wishes he didn't have to take so many medications and believes that they are causing some of his symptoms. He has also been having leg aches. We went over his medications at length today, discussing side effects and indications. He is agreeable to stay on medical therapy. Given exertional symptoms, he is also willing to accept a prescription for isosorbide mononitrate 15 mg daily. I have asked him to hold his statin for a week to see if his leg aching improves. I note that he was not discharged home on Plavix. In the setting of recent ACS, there is certainly no indication. As he is somewhat reluctant to take on more medicine at  this time, I will defer adding this.  2. Ischemic cardiomyopathy: EF was 35-45% by left ventriculography. Discharge summary indicates that the echocardiogram was pending, however I don't see that one was ever done. I will arrange for 2-D echo to follow-up LV function. Continue beta blocker. He is also on spironolactone. I will follow-up a complete metabolic profile today to reevaluate his potassium and renal function. If echo shows ongoing LV dysfunction and renal function/potassium are within normal limits, we should look to add an ACE inhibitor at next follow-up.  3. Hypertensive heart disease: Currently stable on beta blocker, spironolactone, and calcium channel blocker.  4. Hyperlipidemia: LDL was 61 on March 14. LFTs were normal. He is now on high potency Lipitor therapy and reports aching behind his knees and thighs. As above, I will check a complete metabolic profile today. I have asked him to hold his  Lipitor for one week to see if symptoms improve. If they do, I have asked him to reduce his Lipitor dose to 40 mg daily to see if he can tolerate that. Ultimately, if he cannot tolerate Lipitor, I would advise switching to low-dose Crestor.  5. Tobacco abuse: Patient says he quit about a week ago. I congratulated him and advised that he remain quit.  6. Diet-controlled diabetes mellitus: A1c was 6.0 on March 13. He does not use insulin or oral medication at home. Appears to be well controlled.   7. Disposition: Follow-up with Dr. Delton See in 1 month or sooner if necessary.  Nicolasa Ducking, NP 07/04/2015, 4:26 PM

## 2015-07-05 LAB — COMPREHENSIVE METABOLIC PANEL
ALT: 38 U/L (ref 9–46)
AST: 22 U/L (ref 10–40)
Albumin: 4.4 g/dL (ref 3.6–5.1)
Alkaline Phosphatase: 125 U/L — ABNORMAL HIGH (ref 40–115)
BUN: 13 mg/dL (ref 7–25)
CALCIUM: 9.5 mg/dL (ref 8.6–10.3)
CO2: 27 mmol/L (ref 20–31)
Chloride: 96 mmol/L — ABNORMAL LOW (ref 98–110)
Creat: 1 mg/dL (ref 0.60–1.35)
GLUCOSE: 104 mg/dL — AB (ref 65–99)
POTASSIUM: 4.9 mmol/L (ref 3.5–5.3)
Sodium: 138 mmol/L (ref 135–146)
Total Bilirubin: 0.5 mg/dL (ref 0.2–1.2)
Total Protein: 7.2 g/dL (ref 6.1–8.1)

## 2015-07-08 ENCOUNTER — Other Ambulatory Visit: Payer: Self-pay

## 2015-07-08 ENCOUNTER — Ambulatory Visit (HOSPITAL_COMMUNITY): Payer: Self-pay | Attending: Cardiology

## 2015-07-08 DIAGNOSIS — I2510 Atherosclerotic heart disease of native coronary artery without angina pectoris: Secondary | ICD-10-CM | POA: Insufficient documentation

## 2015-07-08 DIAGNOSIS — J90 Pleural effusion, not elsewhere classified: Secondary | ICD-10-CM | POA: Insufficient documentation

## 2015-07-08 DIAGNOSIS — E119 Type 2 diabetes mellitus without complications: Secondary | ICD-10-CM | POA: Insufficient documentation

## 2015-07-08 DIAGNOSIS — I255 Ischemic cardiomyopathy: Secondary | ICD-10-CM | POA: Insufficient documentation

## 2015-07-08 DIAGNOSIS — I119 Hypertensive heart disease without heart failure: Secondary | ICD-10-CM | POA: Insufficient documentation

## 2015-07-08 DIAGNOSIS — Z720 Tobacco use: Secondary | ICD-10-CM | POA: Insufficient documentation

## 2015-08-18 ENCOUNTER — Encounter: Payer: Self-pay | Admitting: *Deleted

## 2015-08-18 ENCOUNTER — Ambulatory Visit (INDEPENDENT_AMBULATORY_CARE_PROVIDER_SITE_OTHER): Payer: Self-pay | Admitting: Cardiology

## 2015-08-18 ENCOUNTER — Encounter: Payer: Self-pay | Admitting: Cardiology

## 2015-08-18 ENCOUNTER — Telehealth: Payer: Self-pay | Admitting: *Deleted

## 2015-08-18 VITALS — BP 138/88 | HR 92 | Ht 67.0 in | Wt 179.2 lb

## 2015-08-18 DIAGNOSIS — I25119 Atherosclerotic heart disease of native coronary artery with unspecified angina pectoris: Secondary | ICD-10-CM

## 2015-08-18 DIAGNOSIS — F17200 Nicotine dependence, unspecified, uncomplicated: Secondary | ICD-10-CM

## 2015-08-18 DIAGNOSIS — I214 Non-ST elevation (NSTEMI) myocardial infarction: Secondary | ICD-10-CM

## 2015-08-18 DIAGNOSIS — I255 Ischemic cardiomyopathy: Secondary | ICD-10-CM

## 2015-08-18 DIAGNOSIS — I10 Essential (primary) hypertension: Secondary | ICD-10-CM

## 2015-08-18 DIAGNOSIS — E785 Hyperlipidemia, unspecified: Secondary | ICD-10-CM

## 2015-08-18 DIAGNOSIS — I119 Hypertensive heart disease without heart failure: Secondary | ICD-10-CM

## 2015-08-18 MED ORDER — LISINOPRIL 2.5 MG PO TABS: 2.5000 mg | ORAL_TABLET | Freq: Every day | ORAL | Status: DC

## 2015-08-18 NOTE — Telephone Encounter (Signed)
Notified the pt that per Dr Delton SeeNelson, she wants to add lisinopril to his regimen.  Provided pt education on what lisinopril treats.  Confirmed the pharmacy of choice with the pt.  Pt verbalized understanding and agrees with this plan.

## 2015-08-18 NOTE — Patient Instructions (Signed)
Your physician recommends that you continue on your current medications as directed. Please refer to the Current Medication list given to you today.    Your physician recommends that you schedule a follow-up appointment in: 3 MONTHS WITH DR NELSON  

## 2015-08-18 NOTE — Telephone Encounter (Signed)
-----   Message from Lars MassonKatarina H Nelson, MD sent at 08/18/2015  4:23 PM EDT ----- Please add lisinopril 2.5 mg po daily to his regimen.

## 2015-08-18 NOTE — Progress Notes (Signed)
Office Visit    Patient Name: Jimmy Parker Date of Encounter: 08/18/2015  Primary Care Provider:  No PCP Per Patient Primary Cardiologist:  Eloy EndK. Danni Leabo, MD   Chief Complaint     45 year old male status post recent non-ST segment elevation myocardial infarction who presents for follow-up.  Past Medical History    Past Medical History  Diagnosis Date  . Hypertensive heart disease   . Diet-controlled diabetes mellitus (HCC)   . CAD (coronary artery disease)     a. 07/2013 Neg MV;  b. 06/2015 NSTEMI/Cath:LM nl, LAD 913m,  D1/2/3 nl, RI mod/nl, LCX 6213m, OM1 small, OM2 nl, RCA 20p, 15820m, RPDA fills via collats from OM3, EF 35-45%.  . Ischemic cardiomyopathy     a. 07/2013 Echo: EF 50-55%, no rwma, Gr1 DD;  b. 06/2015 EF 35-45% by Vgram.   . Hyperlipidemia   . Tobacco abuse     a. quit 06/2015.   Past Surgical History  Procedure Laterality Date  . Shoulder surgery      Right: Patient denies any hx of shoulder surgery  . Fracture surgery      femur and hip  . Cardiac catheterization N/A 06/23/2015    Procedure: Left Heart Cath and Coronary Angiography;  Surgeon: Kathleene Hazelhristopher D McAlhany, MD;  [facility]: [facility];  Service: Cardiovascular;  Laterality: N/A;    Allergies  No Known Allergies  History of Present Illness    45 year old male with the above past medical history. He was recently admitted to [facility] following a 2 day history of chest pain. Initial troponin was elevated at 5. He was chest pain-free after initial ER evaluation. 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 EF was 35-45%. Medical therapy was recommended. He was placed on aspirin, statin, beta blocker, and spironolactone therapy. He was subsequently discharged. Since discharge, he has had intermittent dyspnea exertion also mild chest discomfort. He is also noted aching in the backs of his legs. He was upset that he did not get an  adequate explanation of the events that occurred during his hospital physician. He had a misunderstanding about his anatomy and treatment. We discussed this at length today. He wishes he did not have to take medication and we also discussed the importance of what he is on and why he is on it. He has quit smoking. He denies PND, orthopnea, dizziness, syncope, edema, or early satiety.  08/18/15 - 6 weeks follow up, the patient has occassional chest pain, not worsening, he denies SOB, LE edema, Orthopnea, PND. He has no income and would like to return to work or apply for disability. He is complaint with his meds. He quit smoking. His most recent echocardiogram from 07/08/15 showed improved LVEF 35-40% --> 45-50% with inferior wall hypokinesis. Denies palpitations or syncope.    Home Medications    Prior to Admission medications   Medication Sig Start Date End Date Taking? Authorizing Provider  acetaminophen (TYLENOL) 325 MG tablet Take 2 tablets (650 mg total) by mouth every 4 (four) hours as needed for headache or mild pain. 06/24/15  Yes Luke K Kilroy, PA-C  amLODipine (NORVASC) 10 MG tablet Take 1 tablet (10 mg total) by mouth daily. 06/24/15  Yes Abelino DerrickLuke K Kilroy, PA-C  aspirin EC 81 MG EC tablet Take 1 tablet (81 mg total) by mouth daily. 06/24/15  Yes Luke K Kilroy, PA-C  atorvastatin (LIPITOR) 80 MG tablet Take 1 tablet (  80 mg total) by mouth daily at 6 PM. 06/24/15  Yes Abelino Derrick, PA-C  metoprolol succinate (TOPROL-XL) 25 MG 24 hr tablet Take 1 tablet (25 mg total) by mouth daily. 06/24/15  Yes Luke K Kilroy, PA-C  nitroGLYCERIN (NITROSTAT) 0.4 MG SL tablet Place 1 tablet (0.4 mg total) under the tongue every 5 (five) minutes x 3 doses as needed for chest pain. 06/24/15  Yes Abelino Derrick, PA-C  spironolactone (ALDACTONE) 25 MG tablet Take 1 tablet (25 mg total) by mouth daily. 06/24/15  Yes Luke K Kilroy, PA-C  isosorbide mononitrate (IMDUR) 30 MG 24 hr tablet Take 0.5 tablets (15 mg total) by mouth  daily. 07/04/15   Ok Anis, NP    Review of Systems    As above, he has been having intermittent dyspnea on exertion also mild chest discomfort. His been having aching in his legs and also pain across his chest and into his abdomen when he coughs. Denies PND, orthopnea, dizziness, syncope, edema, or early satiety.  All other systems reviewed and are otherwise negative except as noted above.  Physical Exam    VS:  BP 138/88 mmHg  Pulse 92  Ht 5\' 7"  (1.702 m)  Wt 179 lb 3.2 oz (81.285 kg)  BMI 28.06 kg/m2 , BMI Body mass index is 28.06 kg/(m^2). GEN: Well nourished, well developed, in no acute distress. HEENT: normal. Neck: Supple, no JVD, carotid bruits, or masses. Cardiac: RRR, no murmurs, rubs, or gallops. No clubbing, cyanosis, edema.  Radials/DP/PT 2+ and equal bilaterally.  Right wrist catheterization site without bleeding, bruit, or hematoma. Respiratory:  Respirations regular and unlabored, clear to auscultation bilaterally. GI: Soft, nontender, nondistended, BS + x 4. MS: no deformity or atrophy. Skin: warm and dry, no rash. Neuro:  Strength and sensation are intact. Psych: Normal affect.  Accessory Clinical Findings    ECG - Regular sinus rhythm, 86, biatrial enlargement, inferior T-wave inversion, T-wave inversion in V6, LVH. No acute changes.  TTE: 07/08/2015 - Left ventricle: The cavity size was normal. Wall thickness was  increased in a pattern of mild LVH. Systolic function was mildly  reduced. The estimated ejection fraction was in the range of 45%  to 50%. Inferior hypokinesis. Features are consistent with a  pseudonormal left ventricular filling pattern, with concomitant  abnormal relaxation and increased filling pressure (grade 2  diastolic dysfunction). Longitudinal strain, TDI: 17 %. - Aortic valve: There was no stenosis. - Mitral valve: There was no significant regurgitation. - Right ventricle: The cavity size was normal. Systolic function   was normal. - Pulmonary arteries: No complete TR doppler jet so unable to  estimate PA systolic pressure. - Inferior vena cava: The vessel was normal in size. The  respirophasic diameter changes were in the normal range (= 50%),  consistent with normal central venous pressure. - Pericardium, extracardiac: Pleural effusion noted.  Impressions:  - Normal LV size with mild LV hypertrophy. EF 45-50% with inferior  hypokinesis. Moderate diastolic dysfunction. Normal RV size and  systolic function. No significant valvular abnormalities.   Assessment & Plan    1.  CAD, NSTEMI on 06/23/2015, Troponin 6, Cath revealed total occlusion of the right coronary artery with left-to-right collaterals. Medical therapy was recommended.  His LVEF 35-40% --> 45-50% with inferior wall hypokinesis. He is feeling somewhat better.  He is advised to start light exercises and trying to find a new job with limited weight lifting as he used to work in a warehouse with heavy  weight lifting.  2. Ischemic cardiomyopathy: LVEF 35-40% --> 45-50%, I will add lisinopril 2.5 mg po daily.   3. Hypertensive heart disease: add lisinopril 2.5 mg po daily.   4. Hyperlipidemia:continue atorvastatin.   5. Tobacco abuse: he quit 2 months ago.  6. Diet-controlled diabetes mellitus: A1c was 6.0 on March 13. He does not use insulin or oral medication at home. Appears to be well controlled.   Follow up in 3 months.   Lars Masson, NP 08/18/2015, 3:17 PM

## 2015-09-05 ENCOUNTER — Emergency Department (HOSPITAL_COMMUNITY)
Admission: EM | Admit: 2015-09-05 | Discharge: 2015-09-05 | Disposition: A | Discharge status: Home / Self Care | Payer: Self-pay | Attending: Emergency Medicine | Admitting: Emergency Medicine

## 2015-09-05 ENCOUNTER — Emergency Department (HOSPITAL_COMMUNITY): Payer: Self-pay

## 2015-09-05 ENCOUNTER — Encounter (HOSPITAL_COMMUNITY): Payer: Self-pay | Admitting: Emergency Medicine

## 2015-09-05 DIAGNOSIS — Z9889 Other specified postprocedural states: Secondary | ICD-10-CM | POA: Insufficient documentation

## 2015-09-05 DIAGNOSIS — E785 Hyperlipidemia, unspecified: Secondary | ICD-10-CM | POA: Insufficient documentation

## 2015-09-05 DIAGNOSIS — I119 Hypertensive heart disease without heart failure: Secondary | ICD-10-CM | POA: Insufficient documentation

## 2015-09-05 DIAGNOSIS — Z7982 Long term (current) use of aspirin: Secondary | ICD-10-CM | POA: Insufficient documentation

## 2015-09-05 DIAGNOSIS — I2510 Atherosclerotic heart disease of native coronary artery without angina pectoris: Secondary | ICD-10-CM | POA: Insufficient documentation

## 2015-09-05 DIAGNOSIS — R200 Anesthesia of skin: Secondary | ICD-10-CM | POA: Insufficient documentation

## 2015-09-05 DIAGNOSIS — Z87891 Personal history of nicotine dependence: Secondary | ICD-10-CM | POA: Insufficient documentation

## 2015-09-05 DIAGNOSIS — J069 Acute upper respiratory infection, unspecified: Secondary | ICD-10-CM | POA: Insufficient documentation

## 2015-09-05 DIAGNOSIS — E119 Type 2 diabetes mellitus without complications: Secondary | ICD-10-CM | POA: Insufficient documentation

## 2015-09-05 DIAGNOSIS — Z79899 Other long term (current) drug therapy: Secondary | ICD-10-CM | POA: Insufficient documentation

## 2015-09-05 LAB — CBC WITH DIFFERENTIAL/PLATELET
BASOS PCT: 0 %
Basophils Absolute: 0 10*3/uL (ref 0.0–0.1)
EOS ABS: 0.5 10*3/uL (ref 0.0–0.7)
Eosinophils Relative: 5 %
HCT: 44.5 % (ref 39.0–52.0)
Hemoglobin: 14.8 g/dL (ref 13.0–17.0)
Lymphocytes Relative: 30 %
Lymphs Abs: 2.7 10*3/uL (ref 0.7–4.0)
MCH: 27.9 pg (ref 26.0–34.0)
MCHC: 33.3 g/dL (ref 30.0–36.0)
MCV: 83.8 fL (ref 78.0–100.0)
MONO ABS: 1.1 10*3/uL — AB (ref 0.1–1.0)
MONOS PCT: 13 %
NEUTROS PCT: 52 %
Neutro Abs: 4.6 10*3/uL (ref 1.7–7.7)
Platelets: 243 10*3/uL (ref 150–400)
RBC: 5.31 MIL/uL (ref 4.22–5.81)
RDW: 13.3 % (ref 11.5–15.5)
WBC: 8.9 10*3/uL (ref 4.0–10.5)

## 2015-09-05 LAB — BASIC METABOLIC PANEL
Anion gap: 7 (ref 5–15)
BUN: 16 mg/dL (ref 6–20)
CALCIUM: 9.7 mg/dL (ref 8.9–10.3)
CHLORIDE: 102 mmol/L (ref 101–111)
CO2: 25 mmol/L (ref 22–32)
CREATININE: 0.89 mg/dL (ref 0.61–1.24)
GFR calc non Af Amer: >60 mL/min (ref >60)
GLUCOSE: 118 mg/dL — AB (ref 65–99)
Potassium: 4 mmol/L (ref 3.5–5.1)
SODIUM: 134 mmol/L — AB (ref 135–145)

## 2015-09-05 LAB — I-STAT TROPONIN, ED: TROPONIN I, POC: 0.01 ng/mL (ref 0.00–0.08)

## 2015-09-05 MED ORDER — CETIRIZINE HCL 10 MG PO TABS: 10.0000 mg | ORAL_TABLET | Freq: Every day | ORAL | Status: DC

## 2015-09-05 NOTE — ED Provider Notes (Signed)
Medical screening examination/treatment/procedure(s) were conducted as a shared visit with non-physician practitioner(s) and myself.  I personally evaluated the patient during the encounter.  45 yo M here with congestion and cough. Also with some intermittent carpal spasms. Exam with some posterior oropharyngeal erythema.  S/S c/w acute sinusitis with possible bronchitis. Plan to treat accordingly.    EKG Interpretation   Date/Time:  Friday Sep 05 2015 17:56:08 EDT Ventricular Rate:  83 PR Interval:  141 QRS Duration: 82 QT Interval:  358 QTC Calculation: 421 R Axis:   12 Text Interpretation:  Sinus rhythm Borderline repolarization abnormality  No significant change since last tracing Confirmed by [facility] MD, Barbara CowerJASON  [phone]) on 09/05/2015 8:46:11 PM        Marily MemosJason Brenna Friesenhahn, MD 09/06/15 [id number]

## 2015-09-05 NOTE — ED Notes (Signed)
Pt verbalized understanding of discharge instructions and follow up care

## 2015-09-05 NOTE — ED Notes (Signed)
PA at the bedside.

## 2015-09-05 NOTE — ED Provider Notes (Signed)
CSN: [id number]     Arrival date & time 09/05/15  1552 History   First MD Initiated Contact with Patient 09/05/15 1722     Chief Complaint  Patient presents with  . Nasal Congestion  . Cough     (Consider location/radiation/quality/duration/timing/severity/associated sxs/prior Treatment) HPI Comments: Patient presents to the emergency department with chief complaint of shortness breath, cough, nasal congestion 2 days. He also reports subjective fevers and chills. Additionally, he states that he has had some numbness in his right arm and hand. He states that this is been ongoing for about the past 2 days also. He reports having a recent MI in March of this year, states that he had a heart catheterization through his right upper extremity. He states that he mostly has chest pain when he is coughing. He denies any other associated symptoms. There are no modifying factors.  The history is provided by the patient. No language interpreter was used.    Past Medical History  Diagnosis Date  . Hypertensive heart disease   . Diet-controlled diabetes mellitus (HCC)   . CAD (coronary artery disease)     a. 07/2013 Neg MV;  b. 06/2015 NSTEMI/Cath:LM nl, LAD 63m,  D1/2/3 nl, RI mod/nl, LCX 14m, OM1 small, OM2 nl, RCA 20p, 139m, RPDA fills via collats from OM3, EF 35-45%.  . Ischemic cardiomyopathy     a. 07/2013 Echo: EF 50-55%, no rwma, Gr1 DD;  b. 06/2015 EF 35-45% by Vgram.   . Hyperlipidemia   . Tobacco abuse     a. quit 06/2015.   Past Surgical History  Procedure Laterality Date  . Shoulder surgery      Right: Patient denies any hx of shoulder surgery  . Fracture surgery      femur and hip  . Cardiac catheterization N/A 06/23/2015    Procedure: Left Heart Cath and Coronary Angiography;  Surgeon: Kathleene Hazel, MD;  Location: [facility] INVASIVE CV LAB;  Service: Cardiovascular;  Laterality: N/A;   Family History  Problem Relation Age of Onset  . Heart attack Maternal Grandfather   .  Hypertension Maternal Grandfather   . Heart attack Paternal Grandmother    Social History  Substance Use Topics  . Smoking status: Former Smoker    Start date: 06/27/2015  . Smokeless tobacco: None  . Alcohol Use: No    Review of Systems  Constitutional: Positive for fever and chills.  HENT: Positive for postnasal drip, rhinorrhea, sinus pressure, sneezing and sore throat.   Respiratory: Positive for cough. Negative for shortness of breath.   Cardiovascular: Negative for chest pain.  Gastrointestinal: Negative for nausea, vomiting, abdominal pain, diarrhea and constipation.  Genitourinary: Negative for dysuria.  Neurological: Positive for numbness.  All other systems reviewed and are negative.     Allergies  Review of patient's allergies indicates no known allergies.  Home Medications   Prior to Admission medications   Medication Sig Start Date End Date Taking? Authorizing Provider  acetaminophen (TYLENOL) 325 MG tablet Take 2 tablets (650 mg total) by mouth every 4 (four) hours as needed for headache or mild pain. 06/24/15   Abelino Derrick, PA-C  amLODipine (NORVASC) 10 MG tablet Take 1 tablet (10 mg total) by mouth daily. 06/24/15   Abelino Derrick, PA-C  aspirin EC 81 MG EC tablet Take 1 tablet (81 mg total) by mouth daily. 06/24/15   Abelino Derrick, PA-C  atorvastatin (LIPITOR) 80 MG tablet Take 1 tablet (80 mg total) by mouth daily  at 6 PM. 06/24/15   Abelino DerrickLuke K Kilroy, PA-C  isosorbide mononitrate (IMDUR) 30 MG 24 hr tablet Take 0.5 tablets (15 mg total) by mouth daily. 07/04/15   Ok Anishristopher R Berge, NP  lisinopril (PRINIVIL,ZESTRIL) 2.5 MG tablet Take 1 tablet (2.5 mg total) by mouth daily. 08/18/15   Lars MassonKatarina H Nelson, MD  metoprolol succinate (TOPROL-XL) 25 MG 24 hr tablet Take 1 tablet (25 mg total) by mouth daily. 06/24/15   Abelino DerrickLuke K Kilroy, PA-C  nitroGLYCERIN (NITROSTAT) 0.4 MG SL tablet Place 1 tablet (0.4 mg total) under the tongue every 5 (five) minutes x 3 doses as needed for  chest pain. 06/24/15   Abelino DerrickLuke K Kilroy, PA-C  spironolactone (ALDACTONE) 25 MG tablet Take 1 tablet (25 mg total) by mouth daily. 06/24/15   Luke K Kilroy, PA-C   BP 122/75 mmHg  Pulse 94  Temp(Src) 99.3 F (37.4 C) (Oral)  Resp 18  SpO2 100% Physical Exam  Constitutional: He is oriented to person, place, and time. He appears well-developed and well-nourished. No distress.  HENT:  Head: Normocephalic and atraumatic.  Right Ear: External ear normal.  Left Ear: External ear normal.  Mildly erythematous, no tonsillar exudate, no abscess, no stridor, uvula is midline  TMs clear bilaterally  Eyes: Conjunctivae and EOM are normal. Pupils are equal, round, and reactive to light. Right eye exhibits no discharge. Left eye exhibits no discharge. No scleral icterus.  Neck: Normal range of motion. Neck supple. No JVD present.  Cardiovascular: Normal rate, regular rhythm, normal heart sounds and intact distal pulses.  Exam reveals no gallop and no friction rub.   No murmur heard. Intact distal pulses  Pulmonary/Chest: Effort normal and breath sounds normal. No stridor. No respiratory distress. He has no wheezes. He has no rales. He exhibits no tenderness.  CTAB  Abdominal: Soft. Bowel sounds are normal. He exhibits no distension and no mass. There is no tenderness. There is no rebound and no guarding.  Musculoskeletal: Normal range of motion. He exhibits no edema or tenderness.  Neurological: He is alert and oriented to person, place, and time.  No sensation deficits of right upper extremity on exam, range of motion strength 5/5  Skin: Skin is warm and dry. No rash noted. He is not diaphoretic.  Psychiatric: He has a normal mood and affect. His behavior is normal. Judgment and thought content normal.  Nursing note and vitals reviewed.   ED Course  Procedures (including critical care time) Results for orders placed or performed during the hospital encounter of 09/05/15  CBC with  Differential/Platelet  Result Value Ref Range   WBC 8.9 4.0 - 10.5 K/uL   RBC 5.31 4.22 - 5.81 MIL/uL   Hemoglobin 14.8 13.0 - 17.0 g/dL   HCT 54.044.5 98.139.0 - 19.152.0 %   MCV 83.8 78.0 - 100.0 fL   MCH 27.9 26.0 - 34.0 pg   MCHC 33.3 30.0 - 36.0 g/dL   RDW 47.813.3 29.511.5 - 62.115.5 %   Platelets 243 150 - 400 K/uL   Neutrophils Relative % 52 %   Neutro Abs 4.6 1.7 - 7.7 K/uL   Lymphocytes Relative 30 %   Lymphs Abs 2.7 0.7 - 4.0 K/uL   Monocytes Relative 13 %   Monocytes Absolute 1.1 (H) 0.1 - 1.0 K/uL   Eosinophils Relative 5 %   Eosinophils Absolute 0.5 0.0 - 0.7 K/uL   Basophils Relative 0 %   Basophils Absolute 0.0 0.0 - 0.1 K/uL  Basic metabolic panel  Result Value Ref Range   Sodium 134 (L) 135 - 145 mmol/L   Potassium 4.0 3.5 - 5.1 mmol/L   Chloride 102 101 - 111 mmol/L   CO2 25 22 - 32 mmol/L   Glucose, Bld 118 (H) 65 - 99 mg/dL   BUN 16 6 - 20 mg/dL   Creatinine, Ser 1.61 0.61 - 1.24 mg/dL   Calcium 9.7 8.9 - 09.6 mg/dL   GFR calc non Af Amer >60 >60 mL/min   GFR calc Af Amer >60 >60 mL/min   Anion gap 7 5 - 15  I-stat troponin, ED  Result Value Ref Range   Troponin i, poc 0.01 0.00 - 0.08 ng/mL   Comment 3           Dg Chest 2 View  09/05/2015  CLINICAL DATA:  Shortness of breath, congestion for 2 days EXAM: CHEST  2 VIEW COMPARISON:  06/23/2015 FINDINGS: The heart size and mediastinal contours are within normal limits. Both lungs are clear. The visualized skeletal structures are unremarkable. IMPRESSION: No active cardiopulmonary disease. Electronically Signed   By: Elige Ko   On: 09/05/2015 18:55     Imaging Review Dg Chest 2 View  09/05/2015  CLINICAL DATA:  Shortness of breath, congestion for 2 days EXAM: CHEST  2 VIEW COMPARISON:  06/23/2015 FINDINGS: The heart size and mediastinal contours are within normal limits. Both lungs are clear. The visualized skeletal structures are unremarkable. IMPRESSION: No active cardiopulmonary disease. Electronically Signed   By:  Elige Ko   On: 09/05/2015 18:55   I have personally reviewed and evaluated these images and lab results as part of my medical decision-making.   EKG Interpretation   Date/Time:  Friday Sep 05 2015 17:56:08 EDT Ventricular Rate:  83 PR Interval:  141 QRS Duration: 82 QT Interval:  358 QTC Calculation: 421 R Axis:   12 Text Interpretation:  Sinus rhythm Borderline repolarization abnormality  No significant change since last tracing Confirmed by [facility] MD, Barbara Cower  [phone]) on 09/05/2015 8:46:11 PM      MDM   Final diagnoses:  URI (upper respiratory infection)    Pt CXR negative for acute infiltrate. Patients symptoms are consistent with URI, likely viral etiology. Discussed that antibiotics are not indicated for viral infections. Pt will be discharged with symptomatic treatment.  Verbalizes understanding and is agreeable with plan. Pt is hemodynamically stable & in NAD prior to dc.  Patient seen by and discussed with Dr. Clayborne Dana, who agrees that symptoms are URI/sinus related.  Possible muscle cramps or spasms in right hand, but no evidence of emergent process.  Normal pulses, hand is warm, no objective numbness, ROM and strength 5/5.    Roxy Horseman, PA-C 09/05/15 2124  Marily Memos, MD 09/06/15 9811

## 2015-09-05 NOTE — ED Notes (Signed)
Pt here with nasal congestion and cought x 2 days. Pt reports subjective fevers and weakness as well. Pt with MI in March of this year- sts "I just thought I should get checked out."  Pt reports "bad pains" every time he coughs. Pt also reports diarrhea.

## 2015-09-05 NOTE — ED Notes (Signed)
Pt now reports that his hand is tingling since 2 days ago.

## 2015-09-05 NOTE — Discharge Instructions (Signed)
Upper Respiratory Infection, Adult Most upper respiratory infections (URIs) are a viral infection of the air passages leading to the lungs. A URI affects the nose, throat, and upper air passages. The most common type of URI is nasopharyngitis and is typically referred to as "the common cold." URIs run their course and usually go away on their own. Most of the time, a URI does not require medical attention, but sometimes a bacterial infection in the upper airways can follow a viral infection. This is called a secondary infection. Sinus and middle ear infections are common types of secondary upper respiratory infections. Bacterial pneumonia can also complicate a URI. A URI can worsen asthma and chronic obstructive pulmonary disease (COPD). Sometimes, these complications can require emergency medical care and may be life threatening.  CAUSES Almost all URIs are caused by viruses. A virus is a type of germ and can spread from one person to another.  RISKS FACTORS You may be at risk for a URI if:   You smoke.   You have chronic heart or lung disease.  You have a weakened defense (immune) system.   You are very young or very old.   You have nasal allergies or asthma.  You work in crowded or poorly ventilated areas.  You work in health care facilities or schools. SIGNS AND SYMPTOMS  Symptoms typically develop 2-3 days after you come in contact with a cold virus. Most viral URIs last 7-10 days. However, viral URIs from the influenza virus (flu virus) can last 14-18 days and are typically more severe. Symptoms may include:   Runny or stuffy (congested) nose.   Sneezing.   Cough.   Sore throat.   Headache.   Fatigue.   Fever.   Loss of appetite.   Pain in your forehead, behind your eyes, and over your cheekbones (sinus pain).  Muscle aches.  DIAGNOSIS  Your health care provider may diagnose a URI by:  Physical exam.  Tests to check that your symptoms are not due to  another condition such as:  Strep throat.  Sinusitis.  Pneumonia.  Asthma. TREATMENT  A URI goes away on its own with time. It cannot be cured with medicines, but medicines may be prescribed or recommended to relieve symptoms. Medicines may help:  Reduce your fever.  Reduce your cough.  Relieve nasal congestion. HOME CARE INSTRUCTIONS   Take medicines only as directed by your health care provider.   Gargle warm saltwater or take cough drops to comfort your throat as directed by your health care provider.  Use a warm mist humidifier or inhale steam from a shower to increase air moisture. This may make it easier to breathe.  Drink enough fluid to keep your urine clear or pale yellow.   Eat soups and other clear broths and maintain good nutrition.   Rest as needed.   Return to work when your temperature has returned to normal or as your health care provider advises. You may need to stay home longer to avoid infecting others. You can also use a face mask and careful hand washing to prevent spread of the virus.  Increase the usage of your inhaler if you have asthma.   Do not use any tobacco products, including cigarettes, chewing tobacco, or electronic cigarettes. If you need help quitting, ask your health care provider. PREVENTION  The best way to protect yourself from getting a cold is to practice good hygiene.   Avoid oral or hand contact with people with cold   symptoms.   Wash your hands often if contact occurs.  There is no clear evidence that vitamin C, vitamin E, echinacea, or exercise reduces the chance of developing a cold. However, it is always recommended to get plenty of rest, exercise, and practice good nutrition.  SEEK MEDICAL CARE IF:   You are getting worse rather than better.   Your symptoms are not controlled by medicine.   You have chills.  You have worsening shortness of breath.  You have brown or red mucus.  You have yellow or brown nasal  discharge.  You have pain in your face, especially when you bend forward.  You have a fever.  You have swollen neck glands.  You have pain while swallowing.  You have white areas in the back of your throat. SEEK IMMEDIATE MEDICAL CARE IF:   You have severe or persistent:  Headache.  Ear pain.  Sinus pain.  Chest pain.  You have chronic lung disease and any of the following:  Wheezing.  Prolonged cough.  Coughing up blood.  A change in your usual mucus.  You have a stiff neck.  You have changes in your:  Vision.  Hearing.  Thinking.  Mood. MAKE SURE YOU:   Understand these instructions.  Will watch your condition.  Will get help right away if you are not doing well or get worse.   This information is not intended to replace advice given to you by your health care provider. Make sure you discuss any questions you have with your health care provider.   Document Released: 09/22/2000 Document Revised: 08/13/2014 Document Reviewed: 07/04/2013 Elsevier Interactive Patient Education 2016 Elsevier Inc.  

## 2015-11-24 ENCOUNTER — Encounter: Payer: Self-pay | Admitting: Cardiology

## 2015-12-08 ENCOUNTER — Ambulatory Visit: Payer: Self-pay | Admitting: Cardiology

## 2015-12-19 ENCOUNTER — Encounter: Payer: Self-pay | Admitting: Cardiology

## 2016-06-24 ENCOUNTER — Emergency Department (HOSPITAL_COMMUNITY): Payer: Self-pay

## 2016-06-24 ENCOUNTER — Encounter (HOSPITAL_COMMUNITY): Payer: Self-pay | Admitting: Neurology

## 2016-06-24 ENCOUNTER — Emergency Department (HOSPITAL_COMMUNITY)
Admission: EM | Admit: 2016-06-24 | Discharge: 2016-06-25 | Disposition: A | Discharge status: Home / Self Care | Payer: Self-pay | Attending: Emergency Medicine | Admitting: Emergency Medicine

## 2016-06-24 DIAGNOSIS — I119 Hypertensive heart disease without heart failure: Secondary | ICD-10-CM | POA: Insufficient documentation

## 2016-06-24 DIAGNOSIS — Z79899 Other long term (current) drug therapy: Secondary | ICD-10-CM | POA: Insufficient documentation

## 2016-06-24 DIAGNOSIS — I252 Old myocardial infarction: Secondary | ICD-10-CM | POA: Insufficient documentation

## 2016-06-24 DIAGNOSIS — R072 Precordial pain: Secondary | ICD-10-CM | POA: Insufficient documentation

## 2016-06-24 DIAGNOSIS — I2510 Atherosclerotic heart disease of native coronary artery without angina pectoris: Secondary | ICD-10-CM | POA: Insufficient documentation

## 2016-06-24 DIAGNOSIS — R079 Chest pain, unspecified: Secondary | ICD-10-CM

## 2016-06-24 DIAGNOSIS — Z7982 Long term (current) use of aspirin: Secondary | ICD-10-CM | POA: Insufficient documentation

## 2016-06-24 DIAGNOSIS — Z87891 Personal history of nicotine dependence: Secondary | ICD-10-CM | POA: Insufficient documentation

## 2016-06-24 DIAGNOSIS — R51 Headache: Secondary | ICD-10-CM | POA: Insufficient documentation

## 2016-06-24 LAB — BASIC METABOLIC PANEL
ANION GAP: 11 (ref 5–15)
BUN: 13 mg/dL (ref 6–20)
CHLORIDE: 99 mmol/L — AB (ref 101–111)
CO2: 25 mmol/L (ref 22–32)
Calcium: 9.5 mg/dL (ref 8.9–10.3)
Creatinine, Ser: 1.05 mg/dL (ref 0.61–1.24)
GFR calc Af Amer: >60 mL/min (ref >60)
GLUCOSE: 279 mg/dL — AB (ref 65–99)
POTASSIUM: 4.1 mmol/L (ref 3.5–5.1)
Sodium: 135 mmol/L (ref 135–145)

## 2016-06-24 LAB — I-STAT TROPONIN, ED: Troponin i, poc: 0.01 ng/mL (ref 0.00–0.08)

## 2016-06-24 LAB — TROPONIN I

## 2016-06-24 LAB — CBC
HEMATOCRIT: 47.2 % (ref 39.0–52.0)
HEMOGLOBIN: 15.8 g/dL (ref 13.0–17.0)
MCH: 28.7 pg (ref 26.0–34.0)
MCHC: 33.5 g/dL (ref 30.0–36.0)
MCV: 85.7 fL (ref 78.0–100.0)
Platelets: 244 10*3/uL (ref 150–400)
RBC: 5.51 MIL/uL (ref 4.22–5.81)
RDW: 12.7 % (ref 11.5–15.5)
WBC: 9.3 10*3/uL (ref 4.0–10.5)

## 2016-06-24 MED ORDER — SODIUM CHLORIDE 0.9 % IV BOLUS (SEPSIS)
1000.0000 mL | Freq: Once | INTRAVENOUS | Status: AC
  Administered 2016-06-24: 1000 mL via INTRAVENOUS

## 2016-06-24 NOTE — ED Notes (Signed)
Pt requesting water. OK per Dr Clayborne DanaMesner. This tech took pt a cup of ice water. Pt declines water from a machine and he is bottled water. This tech informs pt we only have water by the cup. Pt request this tech to get him a bottle of water from the vending machines. Pt give this tech 2 dollars. The water was 1.50 and pt was given back. 50 cent.

## 2016-06-24 NOTE — ED Triage Notes (Signed)
Pt reports cp, sob since today. No radiation, denies n/v. Feels like heart burn. Denies diaphoresis. Reports MI, with similar presentation. They were unable to put any stents in, unsure why. Is a x 4. Took 1-81 mg aspirin.

## 2016-06-24 NOTE — ED Notes (Signed)
Patient transported to CT 

## 2016-06-25 NOTE — ED Provider Notes (Signed)
[facility]-EMERGENCY DEPT Provider Note   CSN: [id number] Arrival date & time: 06/24/16  1718     History   Chief Complaint Chief Complaint  Patient presents with  . Chest Pain  . Shortness of Breath    HPI Jimmy Parker is a 46 y.o. male.   Chest Pain   This is a new problem. The current episode started 6 to 12 hours ago. The problem occurs constantly. The problem has not changed since onset.The pain is associated with raising an arm. The pain is present in the substernal region and lateral region. The quality of the pain is described as sharp. The symptoms are aggravated by deep breathing.    Past Medical History:  Diagnosis Date  . CAD (coronary artery disease)    a. 07/2013 Neg MV;  b. 06/2015 NSTEMI/Cath:LM nl, LAD 42m,  D1/2/3 nl, RI mod/nl, LCX 50m, OM1 small, OM2 nl, RCA 20p, 125m, RPDA fills via collats from OM3, EF 35-45%.  . Diet-controlled diabetes mellitus (HCC)   . Hyperlipidemia   . Hypertensive heart disease   . Ischemic cardiomyopathy    a. 07/2013 Echo: EF 50-55%, no rwma, Gr1 DD;  b. 06/2015 EF 35-45% by Vgram.   . Tobacco abuse    a. quit 06/2015.    Patient Active Problem List   Diagnosis Date Noted  . CAD (coronary artery disease)   . Ischemic cardiomyopathy   . Diet-controlled diabetes mellitus (HCC)   . Hypertensive heart disease   . Tobacco abuse   . CAD- occluded RCA with collaterals 06/24/2015  . NSTEMI (non-ST elevated myocardial infarction) (HCC) 06/23/2015  . Dyslipidemia 06/23/2015  . Chest pain 08/03/2013  . TOBACCO ABUSE 08/05/2009  . DYSMETABOLIC SYNDROME 08/13/2008  . Diabetes mellitus (HCC) 05/28/2008  . HYPERTENSION, BENIGN ESSENTIAL 06/08/2007    Past Surgical History:  Procedure Laterality Date  . CARDIAC CATHETERIZATION N/A 06/23/2015   Procedure: Left Heart Cath and Coronary Angiography;  Surgeon: Kathleene Hazel, MD;  Location: [facility] INVASIVE CV LAB;  Service: Cardiovascular;  Laterality: N/A;  . FRACTURE SURGERY     femur  and hip  . SHOULDER SURGERY     Right: Patient denies any hx of shoulder surgery       Home Medications    Prior to Admission medications   Medication Sig Start Date End Date Taking? Authorizing Provider  amLODipine (NORVASC) 10 MG tablet Take 1 tablet (10 mg total) by mouth daily. 06/24/15  Yes Abelino Derrick, PA-C  aspirin EC 81 MG EC tablet Take 1 tablet (81 mg total) by mouth daily. Patient taking differently: Take 81 mg by mouth at bedtime.  06/24/15  Yes Luke K Kilroy, PA-C  isosorbide mononitrate (IMDUR) 30 MG 24 hr tablet Take 0.5 tablets (15 mg total) by mouth daily. 07/04/15  Yes Ok Anis, NP  nitroGLYCERIN (NITROSTAT) 0.4 MG SL tablet Place 1 tablet (0.4 mg total) under the tongue every 5 (five) minutes x 3 doses as needed for chest pain. 06/24/15  Yes Abelino Derrick, PA-C    Family History Family History  Problem Relation Age of Onset  . Heart attack Maternal Grandfather   . Hypertension Maternal Grandfather   . Heart attack Paternal Grandmother     Social History Social History  Substance Use Topics  . Smoking status: Former Smoker    Start date: 06/27/2015  . Smokeless tobacco: Never Used  . Alcohol use No     Allergies   Patient has no known allergies.  Review of Systems Review of Systems  Cardiovascular: Positive for chest pain.  All other systems reviewed and are negative.    Physical Exam Updated Vital Signs BP (!) 143/91   Pulse 84   Temp 98.8 F (37.1 C) (Oral)   Resp 18   Ht 5\' 7"  (1.702 m)   Wt 184 lb (83.5 kg)   SpO2 95%   BMI 28.82 kg/m   Physical Exam  Constitutional: He appears well-developed and well-nourished.  HENT:  Head: Normocephalic and atraumatic.  Eyes: Conjunctivae and EOM are normal.  Neck: Normal range of motion.  Cardiovascular: Normal rate.   Pulmonary/Chest: Effort normal. No respiratory distress.  Abdominal: Soft. He exhibits no distension.  Musculoskeletal: Normal range of motion.  Neurological: He  is alert.  Skin: Skin is warm and dry.  Nursing note and vitals reviewed.    ED Treatments / Results  Labs (all labs ordered are listed, but only abnormal results are displayed) Labs Reviewed  BASIC METABOLIC PANEL - Abnormal; Notable for the following:       Result Value   Chloride 99 (*)    Glucose, Bld 279 (*)    All other components within normal limits  CBC  TROPONIN I  I-STAT TROPOININ, ED    EKG  EKG Interpretation  Date/Time:  Thursday June 24 2016 17:22:56 EDT Ventricular Rate:  105 PR Interval:  134 QRS Duration: 82 QT Interval:  338 QTC Calculation: 446 R Axis:   4 Text Interpretation:  Sinus tachycardia Possible Left atrial enlargement Left ventricular hypertrophy Abnormal ECG When compared with ECG of 09/05/2015, No significant change was found Confirmed by [facility]  MD, DAVID ([id number]) on 06/24/2016 6:17:38 PM Also confirmed by Preston Fleeting  MD, DAVID ([id number]), editor [place] CT, Jola Babinski [phone])  on 06/25/2016 7:15:16 AM       Radiology Dg Chest 2 View  Result Date: 06/24/2016 CLINICAL DATA:  Shortness of breath and chest pain EXAM: CHEST  2 VIEW COMPARISON:  09/05/2015 FINDINGS: The heart size and mediastinal contours are within normal limits. Both lungs are clear. The visualized skeletal structures are unremarkable. IMPRESSION: No active cardiopulmonary disease. Electronically Signed   By: Jasmine Pang M.D.   On: 06/24/2016 18:12   Ct Head Wo Contrast  Result Date: 06/24/2016 CLINICAL DATA:  46 year old male with headache.  No known injury. EXAM: CT HEAD WITHOUT CONTRAST TECHNIQUE: Contiguous axial images were obtained from the base of the skull through the vertex without intravenous contrast. COMPARISON:  Head CT dated 01/29/2012 FINDINGS: Brain: No evidence of acute infarction, hemorrhage, hydrocephalus, extra-axial collection or mass lesion/mass effect. Vascular: No hyperdense vessel or unexpected calcification. Skull: Normal. Negative for fracture or focal lesion.  Sinuses/Orbits: No acute finding. Other: There is a small midline forehead scalp lipoma. IMPRESSION: No acute intracranial pathology. Electronically Signed   By: Elgie Collard M.D.   On: 06/24/2016 19:26    Procedures Procedures (including critical care time)  Medications Ordered in ED Medications  sodium chloride 0.9 % bolus 1,000 mL (0 mLs Intravenous Stopped 06/24/16 2015)     Initial Impression / Assessment and Plan / ED Course  I have reviewed the triage vital signs and the nursing notes.  Pertinent labs & imaging results that were available during my care of the patient were reviewed by me and considered in my medical decision making (see chart for details).     Has h/o of ACS but has atypical story with prolonged symptoms and negative troponins/ecg, doubt it is ACS. Low  wells for PE. cxr clear of pneumonia/PTX/wide mediastinum. Unsure of exact cause of chest pain but feel he is stable for discharge to workup as outpatient.   Final Clinical Impressions(s) / ED Diagnoses   Final diagnoses:  Chest pain, unspecified type     Marily MemosJason Maezie Justin, MD 06/25/16 1124

## 2016-09-16 ENCOUNTER — Encounter (HOSPITAL_COMMUNITY): Payer: Self-pay

## 2016-09-16 ENCOUNTER — Emergency Department (HOSPITAL_COMMUNITY)
Admission: EM | Admit: 2016-09-16 | Discharge: 2016-09-16 | Disposition: A | Discharge status: Home / Self Care | Payer: Self-pay | Attending: Emergency Medicine | Admitting: Emergency Medicine

## 2016-09-16 DIAGNOSIS — Z7982 Long term (current) use of aspirin: Secondary | ICD-10-CM | POA: Insufficient documentation

## 2016-09-16 DIAGNOSIS — Z87891 Personal history of nicotine dependence: Secondary | ICD-10-CM | POA: Insufficient documentation

## 2016-09-16 DIAGNOSIS — I2510 Atherosclerotic heart disease of native coronary artery without angina pectoris: Secondary | ICD-10-CM | POA: Insufficient documentation

## 2016-09-16 DIAGNOSIS — I119 Hypertensive heart disease without heart failure: Secondary | ICD-10-CM | POA: Insufficient documentation

## 2016-09-16 DIAGNOSIS — R739 Hyperglycemia, unspecified: Secondary | ICD-10-CM | POA: Insufficient documentation

## 2016-09-16 DIAGNOSIS — Z79899 Other long term (current) drug therapy: Secondary | ICD-10-CM | POA: Insufficient documentation

## 2016-09-16 LAB — CBC
HCT: 44 % (ref 39.0–52.0)
Hemoglobin: 15 g/dL (ref 13.0–17.0)
MCH: 28.2 pg (ref 26.0–34.0)
MCHC: 34.1 g/dL (ref 30.0–36.0)
MCV: 82.7 fL (ref 78.0–100.0)
Platelets: 276 10*3/uL (ref 150–400)
RBC: 5.32 MIL/uL (ref 4.22–5.81)
RDW: 11.9 % (ref 11.5–15.5)
WBC: 7 10*3/uL (ref 4.0–10.5)

## 2016-09-16 LAB — BASIC METABOLIC PANEL
Anion gap: 12 (ref 5–15)
BUN: 12 mg/dL (ref 6–20)
CALCIUM: 9.3 mg/dL (ref 8.9–10.3)
CO2: 21 mmol/L — AB (ref 22–32)
CREATININE: 0.95 mg/dL (ref 0.61–1.24)
Chloride: 97 mmol/L — ABNORMAL LOW (ref 101–111)
GFR calc Af Amer: >60 mL/min (ref >60)
GLUCOSE: 456 mg/dL — AB (ref 65–99)
Potassium: 3.8 mmol/L (ref 3.5–5.1)
Sodium: 130 mmol/L — ABNORMAL LOW (ref 135–145)

## 2016-09-16 LAB — CBG MONITORING, ED: GLUCOSE-CAPILLARY: 290 mg/dL — AB (ref 65–99)

## 2016-09-16 MED ORDER — LACTATED RINGERS IV BOLUS (SEPSIS)
1000.0000 mL | Freq: Once | INTRAVENOUS | Status: AC
  Administered 2016-09-16: 1000 mL via INTRAVENOUS

## 2016-09-16 MED ORDER — METFORMIN HCL 1000 MG PO TABS: 500.0000 mg | ORAL_TABLET | Freq: Two times a day (BID) | ORAL | 0 refills | Status: DC

## 2016-09-16 NOTE — ED Triage Notes (Signed)
Per Pt, pt is known to have situational diabetes. Pt reports that he woke up this morning with lower back pain, bilateral blurred vision, and urinary frequency. Pt reports having high blood sugar. Reports some slurred speech and confusion. Reports left-sided weakness.

## 2016-09-16 NOTE — ED Notes (Signed)
PT states understanding of care given, follow up care, and medication prescribed. PT ambulated from ED to car with a steady gait. 

## 2016-09-16 NOTE — ED Notes (Signed)
ED Provider at bedside. 

## 2016-09-16 NOTE — ED Notes (Signed)
Pt came to nurses desk aggravated as to why he's been waiting all this time and has been seeing people come in before him and then discharged while he's still in the waiting room. This pt asked who he needs to talk to in order to make a complaint, gave him Patient Experience's number. Explained to this pt how it works as far as pt's coming through EMS as well as the front door, and how most of the pt's that he's seeing is more than likely going to [facility] where it's something fast and easy to get in and out. Made pt aware that he has been moved to a room in the system and someone will be out shortly to come get him and take him back.

## 2016-09-16 NOTE — ED Provider Notes (Signed)
[facility]-EMERGENCY DEPT Provider Note   CSN: [id number] Arrival date & time: 09/16/16  1448     History   Chief Complaint Chief Complaint  Patient presents with  . Hyperglycemia    HPI Jimmy Parker is a 46 y.o. male.   Hyperglycemia  Blood sugar level PTA:  500s Onset quality:  Gradual Timing:  Constant Progression:  Unchanged Chronicity:  New Diabetes status:  Non-diabetic (Previously on metformin for diabetes, taken off years ago when his A1c improved. Recently seen a PCP, called and told his A1c was 13. Has not been started on medication. Next appointment is in 1 month.) Relieved by:  Nothing Ineffective treatments:  None tried Associated symptoms: blurred vision, increased thirst and polyuria   Associated symptoms: no abdominal pain, no chest pain, no confusion, no dizziness, no dysuria, no fever, no nausea, no shortness of breath, no vomiting and no weakness     Past Medical History:  Diagnosis Date  . CAD (coronary artery disease)    a. 07/2013 Neg MV;  b. 06/2015 NSTEMI/Cath:LM nl, LAD 4961m,  D1/2/3 nl, RI mod/nl, LCX 5161m, OM1 small, OM2 nl, RCA 20p, 15624m, RPDA fills via collats from OM3, EF 35-45%.  . Diet-controlled diabetes mellitus (HCC)   . Hyperlipidemia   . Hypertensive heart disease   . Ischemic cardiomyopathy    a. 07/2013 Echo: EF 50-55%, no rwma, Gr1 DD;  b. 06/2015 EF 35-45% by Vgram.   . Tobacco abuse    a. quit 06/2015.    Patient Active Problem List   Diagnosis Date Noted  . CAD (coronary artery disease)   . Ischemic cardiomyopathy   . Diet-controlled diabetes mellitus (HCC)   . Hypertensive heart disease   . Tobacco abuse   . CAD- occluded RCA with collaterals 06/24/2015  . NSTEMI (non-ST elevated myocardial infarction) (HCC) 06/23/2015  . Dyslipidemia 06/23/2015  . Chest pain 08/03/2013  . TOBACCO ABUSE 08/05/2009  . DYSMETABOLIC SYNDROME 08/13/2008  . Diabetes mellitus (HCC) 05/28/2008  . HYPERTENSION, BENIGN ESSENTIAL 06/08/2007    Past  Surgical History:  Procedure Laterality Date  . CARDIAC CATHETERIZATION N/A 06/23/2015   Procedure: Left Heart Cath and Coronary Angiography;  Surgeon: Kathleene Hazelhristopher D McAlhany, MD;  [facility];  Service: Cardiovascular;  Laterality: N/A;  . FRACTURE SURGERY     femur and hip  . SHOULDER SURGERY     Right: Patient denies any hx of shoulder surgery       Home Medications    Prior to Admission medications   Medication Sig Start Date End Date Taking? Authorizing Provider  amLODipine (NORVASC) 10 MG tablet Take 1 tablet (10 mg total) by mouth daily. 06/24/15  Yes Abelino DerrickKilroy, Luke K, PA-C  aspirin EC 81 MG EC tablet Take 1 tablet (81 mg total) by mouth daily. Patient taking differently: Take 81 mg by mouth at bedtime.  06/24/15  Yes Kilroy, Luke K, PA-C  isosorbide mononitrate (IMDUR) 30 MG 24 hr tablet Take 0.5 tablets (15 mg total) by mouth daily. 07/04/15  Yes Ok AnisBerge, Christopher R, NP  LISINOPRIL PO Take 1 tablet by mouth daily. Does not know the dose   Yes [provider]  metFORMIN (GLUCOPHAGE) 1000 MG tablet Take 0.5 tablets (500 mg total) by mouth 2 (two) times daily. 09/16/16   Corena HerterMumma, Tait Balistreri, MD  nitroGLYCERIN (NITROSTAT) 0.4 MG SL tablet Place 1 tablet (0.4 mg total) under the tongue every 5 (five) minutes x 3 doses as needed for chest pain. Patient not taking:  Reported on 09/16/2016 06/24/15   Abelino Derrick, PA-C    Family History Family History  Problem Relation Age of Onset  . Heart attack Maternal Grandfather   . Hypertension Maternal Grandfather   . Heart attack Paternal Grandmother     Social History Social History  Substance Use Topics  . Smoking status: Former Smoker    Start date: 06/27/2015  . Smokeless tobacco: Never Used  . Alcohol use No     Allergies   Patient has no known allergies.   Review of Systems Review of Systems  Constitutional: Negative for appetite change and fever.  HENT: Negative for congestion.   Eyes: Positive for  blurred vision and visual disturbance.  Respiratory: Negative for chest tightness and shortness of breath.   Cardiovascular: Negative for chest pain and palpitations.  Gastrointestinal: Negative for abdominal pain, blood in stool, nausea and vomiting.  Endocrine: Positive for polydipsia and polyuria.  Genitourinary: Negative for decreased urine volume, dysuria and flank pain.  Musculoskeletal: Negative for back pain.  Skin: Negative for rash.  Neurological: Negative for dizziness, seizures, weakness and light-headedness.  Psychiatric/Behavioral: Negative for behavioral problems and confusion.     Physical Exam Updated Vital Signs BP (!) 149/95   Pulse 67   Temp 98.4 F (36.9 C) (Oral)   Resp 19   Ht 5\' 7"  (1.702 m)   Wt 81.6 kg (180 lb)   SpO2 100%   BMI 28.19 kg/m   Physical Exam  Constitutional: He is oriented to person, place, and time. He appears well-developed and well-nourished. No distress.  HENT:  Head: Atraumatic.  Mouth/Throat: Oropharynx is clear and moist.  Eyes: Conjunctivae and EOM are normal.  Neck: Normal range of motion. Neck supple.  Cardiovascular: Regular rhythm, normal heart sounds and intact distal pulses.   Pulmonary/Chest: Effort normal and breath sounds normal. No respiratory distress.  Abdominal: Soft. He exhibits no distension. There is no tenderness.  Musculoskeletal: Normal range of motion. He exhibits no edema.  Neurological: He is alert and oriented to person, place, and time.  Skin: Skin is warm. Capillary refill takes less than 2 seconds.  Psychiatric: He has a normal mood and affect.     ED Treatments / Results  Labs (all labs ordered are listed, but only abnormal results are displayed) Labs Reviewed  BASIC METABOLIC PANEL - Abnormal; Notable for the following:       Result Value   Sodium 130 (*)    Chloride 97 (*)    CO2 21 (*)    Glucose, Bld 456 (*)    All other components within normal limits  CBG MONITORING, ED - Abnormal;  Notable for the following:    Glucose-Capillary 290 (*)    All other components within normal limits  CBC  URINALYSIS, ROUTINE W REFLEX MICROSCOPIC  CBG MONITORING, ED    EKG  EKG Interpretation None       Radiology No results found.  Procedures Procedures (including critical care time)  Medications Ordered in ED Medications  lactated ringers bolus 1,000 mL (0 mLs Intravenous Stopped 09/16/16 1945)     Initial Impression / Assessment and Plan / ED Course  I have reviewed the triage vital signs and the nursing notes.  Pertinent labs & imaging results that were available during my care of the patient were reviewed by me and considered in my medical decision making (see chart for details).    Patient is a 46 year old male who presents to the emergency department with hyperglycemia  associated with blurry vision. Recent A1c 13. Patient has not been started on diabetic medication, previously on metformin years ago. On arrival no acute distress, not ill appearing. Afebrile, hemodynamically stable.  Lab work remarkable for glucose 456, CO2 21. Corrected sodium within normal range. Anion gap 12. Creatinine 0.95. No leukocytosis. Patient with hyperglycemia likely secondary to no medications. Patient does not appear to be in DKA. Patient given IV hydration. On reevaluation glucose 290. Patient without infectious symptoms. No nausea or vomiting, tolerating by mouth.  Patient stable for discharge home. Given a prescription for metformin 500 mg twice a day. Discussed follow-up with primary care physician in the next 1-2 days for reevaluation. Given strict return processes to the emergency department. Discussed increasing oral hydration. She expressed understanding, no symptoms or concerns at time of discharge.   Final Clinical Impressions(s) / ED Diagnoses   Final diagnoses:  Hyperglycemia    New Prescriptions Discharge Medication List as of 09/16/2016  7:44 PM    START taking these  medications   Details  metFORMIN (GLUCOPHAGE) 1000 MG tablet Take 0.5 tablets (500 mg total) by mouth 2 (two) times daily., Starting Thu 09/16/2016, Print         Corena Herter, MD 09/16/16 2102    Maia Plan, MD 09/17/16 [phone]

## 2016-09-16 NOTE — ED Notes (Signed)
Pt agreed to put on gown and be hooked up to the monitor.

## 2016-09-16 NOTE — ED Notes (Signed)
Pt doesn't want to disrobe and be hooked up to the monitor. Pt upset about 3 hr wait.

## 2017-01-24 ENCOUNTER — Emergency Department (HOSPITAL_COMMUNITY)
Admission: EM | Admit: 2017-01-24 | Discharge: 2017-01-24 | Discharge status: Against Medical Advice | Payer: Self-pay | Attending: Emergency Medicine | Admitting: Emergency Medicine

## 2017-01-24 ENCOUNTER — Emergency Department (HOSPITAL_COMMUNITY): Payer: Self-pay

## 2017-01-24 ENCOUNTER — Encounter (HOSPITAL_COMMUNITY): Payer: Self-pay | Admitting: Emergency Medicine

## 2017-01-24 DIAGNOSIS — R079 Chest pain, unspecified: Secondary | ICD-10-CM | POA: Insufficient documentation

## 2017-01-24 DIAGNOSIS — Z5321 Procedure and treatment not carried out due to patient leaving prior to being seen by health care provider: Secondary | ICD-10-CM | POA: Insufficient documentation

## 2017-01-24 LAB — CBC
HCT: 45 % (ref 39.0–52.0)
Hemoglobin: 14.9 g/dL (ref 13.0–17.0)
MCH: 28.6 pg (ref 26.0–34.0)
MCHC: 33.1 g/dL (ref 30.0–36.0)
MCV: 86.4 fL (ref 78.0–100.0)
Platelets: 256 10*3/uL (ref 150–400)
RBC: 5.21 MIL/uL (ref 4.22–5.81)
RDW: 12.6 % (ref 11.5–15.5)
WBC: 9.3 10*3/uL (ref 4.0–10.5)

## 2017-01-24 LAB — BASIC METABOLIC PANEL
Anion gap: 9 (ref 5–15)
BUN: 9 mg/dL (ref 6–20)
CALCIUM: 9.2 mg/dL (ref 8.9–10.3)
CO2: 26 mmol/L (ref 22–32)
Chloride: 104 mmol/L (ref 101–111)
Creatinine, Ser: 0.98 mg/dL (ref 0.61–1.24)
GFR calc Af Amer: >60 mL/min (ref >60)
GLUCOSE: 127 mg/dL — AB (ref 65–99)
Potassium: 3.7 mmol/L (ref 3.5–5.1)
Sodium: 139 mmol/L (ref 135–145)

## 2017-01-24 LAB — I-STAT TROPONIN, ED: TROPONIN I, POC: 0 ng/mL (ref 0.00–0.08)

## 2017-01-24 NOTE — ED Triage Notes (Addendum)
Pt reports central chest pain that started yesterday at 7pm and also was having RLQ pain. Pt denies any sob. Pt took one nitro tablet at 1pm with no relief in pain.

## 2017-01-24 NOTE — ED Notes (Addendum)
This RN walked into room to triage pt, asked pt what brought him in today, pt sts "first of all, I don't need you talking to me like that, I'm a grown ass man." This RN attempted to explain to pt that I had to triage him in order to figure out what was going on. Pt went on to say "He didn't tell you the whole story. I don't need y'all treating me like this." This RN attempted to explain to pt the point of triage and inquire as to who he was speaking of. Pt got irate and exclaimed "YOU KNOW WHAT? I'M DONE! GET YOUR BOSS!" Charge RN made aware.

## 2017-09-03 ENCOUNTER — Emergency Department (HOSPITAL_COMMUNITY)
Admission: EM | Admit: 2017-09-03 | Discharge: 2017-09-03 | Disposition: A | Discharge status: Home / Self Care | Payer: Self-pay | Attending: Emergency Medicine | Admitting: Emergency Medicine

## 2017-09-03 ENCOUNTER — Other Ambulatory Visit: Payer: Self-pay

## 2017-09-03 ENCOUNTER — Encounter (HOSPITAL_COMMUNITY): Payer: Self-pay | Admitting: Emergency Medicine

## 2017-09-03 DIAGNOSIS — Z7982 Long term (current) use of aspirin: Secondary | ICD-10-CM | POA: Insufficient documentation

## 2017-09-03 DIAGNOSIS — Z87891 Personal history of nicotine dependence: Secondary | ICD-10-CM | POA: Insufficient documentation

## 2017-09-03 DIAGNOSIS — Z79899 Other long term (current) drug therapy: Secondary | ICD-10-CM | POA: Insufficient documentation

## 2017-09-03 DIAGNOSIS — R739 Hyperglycemia, unspecified: Secondary | ICD-10-CM

## 2017-09-03 DIAGNOSIS — E1165 Type 2 diabetes mellitus with hyperglycemia: Secondary | ICD-10-CM | POA: Insufficient documentation

## 2017-09-03 DIAGNOSIS — I509 Heart failure, unspecified: Secondary | ICD-10-CM | POA: Insufficient documentation

## 2017-09-03 DIAGNOSIS — I2510 Atherosclerotic heart disease of native coronary artery without angina pectoris: Secondary | ICD-10-CM | POA: Insufficient documentation

## 2017-09-03 DIAGNOSIS — Z7984 Long term (current) use of oral hypoglycemic drugs: Secondary | ICD-10-CM | POA: Insufficient documentation

## 2017-09-03 DIAGNOSIS — I110 Hypertensive heart disease with heart failure: Secondary | ICD-10-CM | POA: Insufficient documentation

## 2017-09-03 HISTORY — DX: Acute myocardial infarction, unspecified: I21.9

## 2017-09-03 HISTORY — DX: Heart failure, unspecified: I50.9

## 2017-09-03 LAB — URINALYSIS, ROUTINE W REFLEX MICROSCOPIC
BILIRUBIN URINE: NEGATIVE
Bacteria, UA: NONE SEEN
HGB URINE DIPSTICK: NEGATIVE
KETONES UR: 5 mg/dL — AB
Leukocytes, UA: NEGATIVE
NITRITE: NEGATIVE
PROTEIN: NEGATIVE mg/dL
Specific Gravity, Urine: 1.037 — ABNORMAL HIGH (ref 1.005–1.030)
pH: 5 (ref 5.0–8.0)

## 2017-09-03 LAB — I-STAT CHEM 8, ED
BUN: 15 mg/dL (ref 6–20)
CHLORIDE: 96 mmol/L — AB (ref 101–111)
Calcium, Ion: 1.23 mmol/L (ref 1.15–1.40)
Creatinine, Ser: 0.8 mg/dL (ref 0.61–1.24)
GLUCOSE: 475 mg/dL — AB (ref 65–99)
HCT: 47 % (ref 39.0–52.0)
HEMOGLOBIN: 16 g/dL (ref 13.0–17.0)
POTASSIUM: 4.5 mmol/L (ref 3.5–5.1)
Sodium: 131 mmol/L — ABNORMAL LOW (ref 135–145)
TCO2: 26 mmol/L (ref 22–32)

## 2017-09-03 LAB — CBG MONITORING, ED
GLUCOSE-CAPILLARY: 436 mg/dL — AB (ref 65–99)
Glucose-Capillary: 352 mg/dL — ABNORMAL HIGH (ref 65–99)

## 2017-09-03 MED ORDER — SODIUM CHLORIDE 0.9 % IV BOLUS
2000.0000 mL | Freq: Once | INTRAVENOUS | Status: AC
  Administered 2017-09-03: 2000 mL via INTRAVENOUS

## 2017-09-03 NOTE — Discharge Instructions (Addendum)
Takeyour usual dose of metformin as soon as he arrived home today.Make sure that you drink at least six 8 ounce glasses of water each day in order to stay well-hydrated. Adequate fluid intake will also make you feel better and help to keep your blood sugar down. Call your primary care provider Tuesday, 09/06/2017. Your medication may need to be adjusted. Return if concern for any reason

## 2017-09-03 NOTE — ED Notes (Signed)
Pt unable to sign due to computer not allowing RN to open [employer] Application to have patient E-sign.

## 2017-09-03 NOTE — ED Provider Notes (Addendum)
[facility]-EMERGENCY DEPT Provider Note   CSN: [id number] Arrival date & time: 09/03/17  0606     History   Chief Complaint Chief Complaint  Patient presents with  . Hyperglycemia    HPI Jimmy Parker is a 47 y.o. male.patient reports his blood sugarsAt approximately 536for the past one week. He complains of feeling tired, increased thirst, increased urination. Denies fever denies pain anywhere denies shortness of breath. Nothing makes symptoms better or worse. No other associated symptoms. No change in diet or medications. He denies noncompliance with medications for the past one week. No other associated symptoms  HPI  Past Medical History:  Diagnosis Date  . CAD (coronary artery disease)    a. 07/2013 Neg MV;  b. 06/2015 NSTEMI/Cath:LM nl, LAD 81m,  D1/2/3 nl, RI mod/nl, LCX 59m, OM1 small, OM2 nl, RCA 20p, 138m, RPDA fills via collats from OM3, EF 35-45%.  . CHF (congestive heart failure) (HCC)   . Diet-controlled diabetes mellitus (HCC)   . Heart attack (HCC)   . Hyperlipidemia   . Hypertension   . Hypertensive heart disease   . Ischemic cardiomyopathy    a. 07/2013 Echo: EF 50-55%, no rwma, Gr1 DD;  b. 06/2015 EF 35-45% by Vgram.   . Tobacco abuse    a. quit 06/2015.    Patient Active Problem List   Diagnosis Date Noted  . CAD (coronary artery disease)   . Ischemic cardiomyopathy   . Diet-controlled diabetes mellitus (HCC)   . Hypertensive heart disease   . Tobacco abuse   . CAD- occluded RCA with collaterals 06/24/2015  . NSTEMI (non-ST elevated myocardial infarction) (HCC) 06/23/2015  . Dyslipidemia 06/23/2015  . Chest pain 08/03/2013  . TOBACCO ABUSE 08/05/2009  . DYSMETABOLIC SYNDROME 08/13/2008  . Diabetes mellitus (HCC) 05/28/2008  . HYPERTENSION, BENIGN ESSENTIAL 06/08/2007    Past Surgical History:  Procedure Laterality Date  . CARDIAC CATHETERIZATION N/A 06/23/2015   Procedure: Left Heart Cath and Coronary Angiography;  Surgeon:  Kathleene Hazel, MD;  Location: [facility] INVASIVE CV LAB;  Service: Cardiovascular;  Laterality: N/A;  . FRACTURE SURGERY     femur and hip  . SHOULDER SURGERY     Right: Patient denies any hx of shoulder surgery        Home Medications    Prior to Admission medications   Medication Sig Start Date End Date Taking? Authorizing Provider  amLODipine (NORVASC) 10 MG tablet Take 1 tablet (10 mg total) by mouth daily. 06/24/15   Abelino Derrick, PA-C  aspirin EC 81 MG EC tablet Take 1 tablet (81 mg total) by mouth daily. Patient taking differently: Take 81 mg by mouth at bedtime.  06/24/15   Abelino Derrick, PA-C  isosorbide mononitrate (IMDUR) 30 MG 24 hr tablet Take 0.5 tablets (15 mg total) by mouth daily. 07/04/15   Creig Hines, NP  LISINOPRIL PO Take 1 tablet by mouth daily. Does not know the dose    [provider]  metFORMIN (GLUCOPHAGE) 1000 MG tablet Take 0.5 tablets (500 mg total) by mouth 2 (two) times daily. 09/16/16   Corena Herter, MD  nitroGLYCERIN (NITROSTAT) 0.4 MG SL tablet Place 1 tablet (0.4 mg total) under the tongue every 5 (five) minutes x 3 doses as needed for chest pain. Patient not taking: Reported on 09/16/2016 06/24/15   Abelino Derrick, PA-C    Family History Family History  Problem Relation Age of Onset  . Heart attack Maternal Grandfather   .  Hypertension Maternal Grandfather   . Heart attack Paternal Grandmother     Social History Social History   Tobacco Use  . Smoking status: Former Smoker    Start date: 06/27/2015  . Smokeless tobacco: Never Used  Substance Use Topics  . Alcohol use: No    Alcohol/week: 0.0 oz  . Drug use: No     Allergies   Patient has no known allergies.   Review of Systems Review of Systems  Constitutional: Positive for fatigue.  HENT: Negative.   Respiratory: Negative.   Cardiovascular: Negative.   Gastrointestinal: Negative.   Endocrine: Positive for polydipsia and polyuria.  Musculoskeletal:  Negative.   Skin: Negative.   Allergic/Immunologic: Positive for immunocompromised state.  Neurological: Negative.   Psychiatric/Behavioral: Negative.   All other systems reviewed and are negative.    Physical Exam Updated Vital Signs BP 133/83 (BP Location: Left Arm)   Pulse 93   Temp 98.3 F (36.8 C) (Oral)   Resp 16   Ht  (1.702 m)   Wt 78 kg (172 lb)   SpO2 98%   BMI 26.94 kg/m   Physical Exam  Constitutional: He is oriented to person, place, and time. He appears well-developed and well-nourished. No distress.  HENT:  Head: Normocephalic and atraumatic.  Mucous membranes dry  Eyes: Pupils are equal, round, and reactive to light. Conjunctivae are normal.  Neck: Neck supple. No tracheal deviation present. No thyromegaly present.  Cardiovascular: Normal rate and regular rhythm.  No murmur heard. Pulmonary/Chest: Effort normal and breath sounds normal.  Abdominal: Soft. Bowel sounds are normal. He exhibits no distension. There is no tenderness.  Genitourinary: Penis normal.  Musculoskeletal: Normal range of motion. He exhibits no edema or tenderness.  Neurological: He is alert and oriented to person, place, and time. Coordination normal.  Skin: Skin is warm and dry. No rash noted.  Psychiatric: He has a normal mood and affect.  Nursing note and vitals reviewed.    ED Treatments / Results  Labs (all labs ordered are listed, but only abnormal results are displayed) Labs Reviewed  CBG MONITORING, ED - Abnormal; Notable for the following components:      Result Value   Glucose-Capillary 436 (*)    All other components within normal limits    EKG None  Radiology No results found.  Procedures Procedures (including critical care time)  Medications Ordered in ED Medications - No data to display Results for orders placed or performed during the hospital encounter of 09/03/17  Urinalysis, Routine w reflex microscopic  Result Value Ref Range   Color, Urine  YELLOW YELLOW   APPearance CLEAR CLEAR   Specific Gravity, Urine 1.037 (H) 1.005 - 1.030   pH 5.0 5.0 - 8.0   Glucose, UA >=500 (A) NEGATIVE mg/dL   Hgb urine dipstick NEGATIVE NEGATIVE   Bilirubin Urine NEGATIVE NEGATIVE   Ketones, ur 5 (A) NEGATIVE mg/dL   Protein, ur NEGATIVE NEGATIVE mg/dL   Nitrite NEGATIVE NEGATIVE   Leukocytes, UA NEGATIVE NEGATIVE   RBC / HPF 0-5 0 - 5 RBC/hpf   WBC, UA 0-5 0 - 5 WBC/hpf   Bacteria, UA NONE SEEN NONE SEEN  CBG monitoring, ED  Result Value Ref Range   Glucose-Capillary 436 (H) 65 - 99 mg/dL  I-stat chem 8, ed  Result Value Ref Range   Sodium 131 (L) 135 - 145 mmol/L   Potassium 4.5 3.5 - 5.1 mmol/L   Chloride 96 (L) 101 - 111 mmol/L  BUN 15 6 - 20 mg/dL   Creatinine, Ser 1.61 0.61 - 1.24 mg/dL   Glucose, Bld 096 (H) 65 - 99 mg/dL   Calcium, Ion 0.45 4.09 - 1.40 mmol/L   TCO2 26 22 - 32 mmol/L   Hemoglobin 16.0 13.0 - 17.0 g/dL   HCT 81.1 91.4 - 78.2 %  POC CBG, ED  Result Value Ref Range   Glucose-Capillary 352 (H) 65 - 99 mg/dL   No results found.  Initial Impression / Assessment and Plan / ED Course  I have reviewed the triage vital signs and the nursing notes.  Pertinent labs & imaging results that were available during my care of the patient were reviewed by me and considered in my medical decision making (see chart for details).     8:40 PM patient asymptomatic after treatment with intravenous fluids. He feels ready to go home. Plan encourage oral hydration. Follow up with PMD next week . No signs of DKA Final Clinical Impressions(s) / ED Diagnoses  Diagnosis hyperglycemia Final diagnoses:  None    ED Discharge Orders    None       Doug Sou, MD 09/03/17 9562    Doug Sou, MD 09/03/17 [phone]

## 2017-09-03 NOTE — ED Triage Notes (Signed)
Pt states his blood sugar has been elevated for the past week or so  Pt states it was reading in the 500s today  Pt states the only sxs he has been having is an increase in urination

## 2017-10-21 ENCOUNTER — Emergency Department (HOSPITAL_COMMUNITY)
Admission: EM | Admit: 2017-10-21 | Discharge: 2017-10-21 | Disposition: A | Discharge status: Home / Self Care | Payer: Self-pay | Attending: Emergency Medicine | Admitting: Emergency Medicine

## 2017-10-21 ENCOUNTER — Encounter (HOSPITAL_COMMUNITY): Payer: Self-pay | Admitting: *Deleted

## 2017-10-21 ENCOUNTER — Other Ambulatory Visit: Payer: Self-pay

## 2017-10-21 DIAGNOSIS — Z79899 Other long term (current) drug therapy: Secondary | ICD-10-CM | POA: Insufficient documentation

## 2017-10-21 DIAGNOSIS — Z87891 Personal history of nicotine dependence: Secondary | ICD-10-CM | POA: Insufficient documentation

## 2017-10-21 DIAGNOSIS — E119 Type 2 diabetes mellitus without complications: Secondary | ICD-10-CM | POA: Insufficient documentation

## 2017-10-21 DIAGNOSIS — I110 Hypertensive heart disease with heart failure: Secondary | ICD-10-CM | POA: Insufficient documentation

## 2017-10-21 DIAGNOSIS — R21 Rash and other nonspecific skin eruption: Secondary | ICD-10-CM | POA: Insufficient documentation

## 2017-10-21 DIAGNOSIS — Z7984 Long term (current) use of oral hypoglycemic drugs: Secondary | ICD-10-CM | POA: Insufficient documentation

## 2017-10-21 DIAGNOSIS — I2510 Atherosclerotic heart disease of native coronary artery without angina pectoris: Secondary | ICD-10-CM | POA: Insufficient documentation

## 2017-10-21 DIAGNOSIS — I509 Heart failure, unspecified: Secondary | ICD-10-CM | POA: Insufficient documentation

## 2017-10-21 DIAGNOSIS — Z7982 Long term (current) use of aspirin: Secondary | ICD-10-CM | POA: Insufficient documentation

## 2017-10-21 MED ORDER — DIPHENHYDRAMINE HCL 25 MG PO TABS: 25.0000 mg | ORAL_TABLET | Freq: Four times a day (QID) | ORAL | 0 refills | Status: DC | PRN

## 2017-10-21 MED ORDER — HYDROCORTISONE 2.5 % EX LOTN: TOPICAL_LOTION | Freq: Two times a day (BID) | CUTANEOUS | 0 refills | Status: DC

## 2017-10-21 NOTE — ED Notes (Signed)
ED Provider at bedside. 

## 2017-10-21 NOTE — ED Triage Notes (Signed)
Pt in c/o of rash to the back of his neck that started yesterday, today started spreading to his arms, rash is itching, no distress noted

## 2017-10-24 NOTE — ED Provider Notes (Signed)
[facility]   CSN: [id number] Arrival date & time: 10/21/17  [id number]     History   Chief Complaint Chief Complaint  Patient presents with  . Rash    HPI Pollyann Savoyony L Cassedy is a 47 y.o. male.  HPI   47 year old male with pruritic rash.  Onset yesterday.  Involves bilateral upper extremities and neck.  No new exposures that he is aware of.  Has not tried taking anything for it.  He denies any respiratory complaints.  No GI complaints.  No dizziness or lightheadedness.  No context similar.  Past Medical History:  Diagnosis Date  . CAD (coronary artery disease)    a. 07/2013 Neg MV;  b. 06/2015 NSTEMI/Cath:LM nl, LAD 6648m,  D1/2/3 nl, RI mod/nl, LCX 7148m, OM1 small, OM2 nl, RCA 20p, 16157m, RPDA fills via collats from OM3, EF 35-45%.  . CHF (congestive heart failure) (HCC)   . Diet-controlled diabetes mellitus (HCC)   . Heart attack (HCC)   . Hyperlipidemia   . Hypertension   . Hypertensive heart disease   . Ischemic cardiomyopathy    a. 07/2013 Echo: EF 50-55%, no rwma, Gr1 DD;  b. 06/2015 EF 35-45% by Vgram.   . Tobacco abuse    a. quit 06/2015.    Patient Active Problem List   Diagnosis Date Noted  . CAD (coronary artery disease)   . Ischemic cardiomyopathy   . Diet-controlled diabetes mellitus (HCC)   . Hypertensive heart disease   . Tobacco abuse   . CAD- occluded RCA with collaterals 06/24/2015  . NSTEMI (non-ST elevated myocardial infarction) (HCC) 06/23/2015  . Dyslipidemia 06/23/2015  . Chest pain 08/03/2013  . TOBACCO ABUSE 08/05/2009  . DYSMETABOLIC SYNDROME 08/13/2008  . Diabetes mellitus (HCC) 05/28/2008  . HYPERTENSION, BENIGN ESSENTIAL 06/08/2007    Past Surgical History:  Procedure Laterality Date  . CARDIAC CATHETERIZATION N/A 06/23/2015   Procedure: Left Heart Cath and Coronary Angiography;  Surgeon: Kathleene Hazelhristopher D McAlhany, MD;  [facility];  Service: Cardiovascular;  Laterality: N/A;  .  FRACTURE SURGERY     femur and hip  . SHOULDER SURGERY     Right: Patient denies any hx of shoulder surgery        Home Medications    Prior to Admission medications   Medication Sig Start Date End Date Taking? Authorizing Provider  amLODipine (NORVASC) 10 MG tablet Take 1 tablet (10 mg total) by mouth daily. 06/24/15   Abelino DerrickKilroy, Luke K, PA-C  aspirin EC 81 MG EC tablet Take 1 tablet (81 mg total) by mouth daily. Patient taking differently: Take 81 mg by mouth at bedtime.  06/24/15   Abelino DerrickKilroy, Luke K, PA-C  Chlorpheniramine Maleate (ALLERGY PO) Take 1 tablet by mouth daily.    [provider]  cholecalciferol (VITAMIN D) 1000 units tablet Take 4,000 Units by mouth daily.    [provider]  diphenhydrAMINE (BENADRYL) 25 MG tablet Take 1 tablet (25 mg total) by mouth every 6 (six) hours as needed. 10/21/17   Raeford RazorKohut, Dacota Ruben, MD  hydrocortisone 2.5 % lotion Apply topically 2 (two) times daily. 10/21/17   Raeford RazorKohut, Favian Kittleson, MD  isosorbide mononitrate (IMDUR) 30 MG 24 hr tablet Take 0.5 tablets (15 mg total) by mouth daily. 07/04/15   Creig HinesBerge, Christopher Ronald, NP  lisinopril (PRINIVIL,ZESTRIL) 20 MG tablet Take 1 tablet by mouth daily. Does not know the dose     [provider]  metFORMIN (GLUCOPHAGE) 1000 MG tablet  Take 0.5 tablets (500 mg total) by mouth 2 (two) times daily. 09/16/16   Corena Herter, MD  nitroGLYCERIN (NITROSTAT) 0.4 MG SL tablet Place 1 tablet (0.4 mg total) under the tongue every 5 (five) minutes x 3 doses as needed for chest pain. Patient not taking: Reported on 09/16/2016 06/24/15   Abelino Derrick, PA-C    Family History Family History  Problem Relation Age of Onset  . Heart attack Maternal Grandfather   . Hypertension Maternal Grandfather   . Heart attack Paternal Grandmother     Social History Social History   Tobacco Use  . Smoking status: Former Smoker    Start date: 06/27/2015  . Smokeless tobacco: Never Used  Substance Use Topics  .  Alcohol use: No    Alcohol/week: 0.0 oz  . Drug use: No     Allergies   Patient has no known allergies.   Review of Systems Review of Systems  All systems reviewed and negative, other than as noted in HPI.  Physical Exam Updated Vital Signs BP (!) 164/101 (BP Location: Right Arm)   Pulse 83   Temp 98.9 F (37.2 C) (Oral)   Resp 17   Ht 5\' 7"  (1.702 m)   Wt 78 kg (172 lb)   SpO2 98%   BMI 26.94 kg/m   Physical Exam  Constitutional: He appears well-developed and well-nourished. No distress.  HENT:  Head: Normocephalic and atraumatic.  Eyes: Conjunctivae are normal. Right eye exhibits no discharge. Left eye exhibits no discharge.  Neck: Neck supple.  Cardiovascular: Normal rate, regular rhythm and normal heart sounds. Exam reveals no gallop and no friction rub.  No murmur heard. Pulmonary/Chest: Effort normal and breath sounds normal. No respiratory distress.  Abdominal: Soft. He exhibits no distension. There is no tenderness.  Musculoskeletal: He exhibits no edema or tenderness.  Neurological: He is alert.  Skin: Skin is warm and dry. Rash noted.  Papular erythematous rash to the dorsal aspect of bilateral hands, forearms and posterior neck/scalp.  Psychiatric: He has a normal mood and affect. His behavior is normal. Thought content normal.  Nursing note and vitals reviewed.    ED Treatments / Results  Labs (all labs ordered are listed, but only abnormal results are displayed) Labs Reviewed - No data to display  EKG None  Radiology No results found.  Procedures Procedures (including critical care time)  Medications Ordered in ED Medications - No data to display   Initial Impression / Assessment and Plan / ED Course  I have reviewed the triage vital signs and the nursing notes.  Pertinent labs & imaging results that were available during my care of the patient were reviewed by me and considered in my medical decision making (see chart for details).      47 year old male with a pruritic erythematous papular rash primarily to sun exposed areas on bilateral upper extremities and posterior neck.  No systemic symptoms.  No obvious precipitant.  Plan systematic treatment.  Final Clinical Impressions(s) / ED Diagnoses   Final diagnoses:  Rash    ED Discharge Orders        Ordered    hydrocortisone 2.5 % lotion  2 times daily     10/21/17 0833    diphenhydrAMINE (BENADRYL) 25 MG tablet  Every 6 hours PRN     10/21/17 1610       Raeford Razor, MD 10/24/17 1629

## 2017-11-27 ENCOUNTER — Encounter (HOSPITAL_COMMUNITY): Payer: Self-pay | Admitting: Emergency Medicine

## 2017-11-27 ENCOUNTER — Emergency Department (HOSPITAL_COMMUNITY): Payer: Self-pay

## 2017-11-27 ENCOUNTER — Emergency Department (HOSPITAL_COMMUNITY)
Admission: EM | Admit: 2017-11-27 | Discharge: 2017-11-27 | Discharge status: Against Medical Advice | Payer: Self-pay | Attending: Emergency Medicine | Admitting: Emergency Medicine

## 2017-11-27 ENCOUNTER — Other Ambulatory Visit: Payer: Self-pay

## 2017-11-27 DIAGNOSIS — E119 Type 2 diabetes mellitus without complications: Secondary | ICD-10-CM | POA: Insufficient documentation

## 2017-11-27 DIAGNOSIS — Z7982 Long term (current) use of aspirin: Secondary | ICD-10-CM | POA: Insufficient documentation

## 2017-11-27 DIAGNOSIS — Z87891 Personal history of nicotine dependence: Secondary | ICD-10-CM | POA: Insufficient documentation

## 2017-11-27 DIAGNOSIS — Z7984 Long term (current) use of oral hypoglycemic drugs: Secondary | ICD-10-CM | POA: Insufficient documentation

## 2017-11-27 DIAGNOSIS — I509 Heart failure, unspecified: Secondary | ICD-10-CM | POA: Insufficient documentation

## 2017-11-27 DIAGNOSIS — R079 Chest pain, unspecified: Secondary | ICD-10-CM | POA: Insufficient documentation

## 2017-11-27 DIAGNOSIS — I110 Hypertensive heart disease with heart failure: Secondary | ICD-10-CM | POA: Insufficient documentation

## 2017-11-27 DIAGNOSIS — I252 Old myocardial infarction: Secondary | ICD-10-CM | POA: Insufficient documentation

## 2017-11-27 DIAGNOSIS — Z79899 Other long term (current) drug therapy: Secondary | ICD-10-CM | POA: Insufficient documentation

## 2017-11-27 DIAGNOSIS — I2510 Atherosclerotic heart disease of native coronary artery without angina pectoris: Secondary | ICD-10-CM | POA: Insufficient documentation

## 2017-11-27 LAB — CBC
HCT: 49.8 % (ref 39.0–52.0)
HEMOGLOBIN: 16 g/dL (ref 13.0–17.0)
MCH: 27.5 pg (ref 26.0–34.0)
MCHC: 32.1 g/dL (ref 30.0–36.0)
MCV: 85.7 fL (ref 78.0–100.0)
Platelets: 260 10*3/uL (ref 150–400)
RBC: 5.81 MIL/uL (ref 4.22–5.81)
RDW: 11.7 % (ref 11.5–15.5)
WBC: 8.4 10*3/uL (ref 4.0–10.5)

## 2017-11-27 LAB — I-STAT TROPONIN, ED
TROPONIN I, POC: 0 ng/mL (ref 0.00–0.08)
TROPONIN I, POC: 0 ng/mL (ref 0.00–0.08)

## 2017-11-27 LAB — BASIC METABOLIC PANEL
ANION GAP: 12 (ref 5–15)
BUN: 12 mg/dL (ref 6–20)
CALCIUM: 9.7 mg/dL (ref 8.9–10.3)
CHLORIDE: 100 mmol/L (ref 98–111)
CO2: 25 mmol/L (ref 22–32)
Creatinine, Ser: 1.07 mg/dL (ref 0.61–1.24)
GFR calc non Af Amer: >60 mL/min (ref >60)
Glucose, Bld: 191 mg/dL — ABNORMAL HIGH (ref 70–99)
Potassium: 3.9 mmol/L (ref 3.5–5.1)
SODIUM: 137 mmol/L (ref 135–145)

## 2017-11-27 LAB — BRAIN NATRIURETIC PEPTIDE: B NATRIURETIC PEPTIDE 5: 11.5 pg/mL (ref 0.0–100.0)

## 2017-11-27 MED ORDER — ASPIRIN 81 MG PO CHEW
324.0000 mg | CHEWABLE_TABLET | Freq: Once | ORAL | Status: DC
  Filled 2017-11-27: qty 4

## 2017-11-27 NOTE — ED Notes (Signed)
This RN sat with pt and discussed labs and EKG results.  Pt was upset that he waited in the lobby with CP for 3 hours and wasn't updated on anything.  Once pt educated and updated, he seems to be more understanding.  Pt allowed this RN to draw a repeat troponin after explaining its purpose and it was sent for analysis.

## 2017-11-27 NOTE — ED Notes (Signed)
Pt refused to get onto stretcher or be connected to monitor or talk with me until seen by doctor. Pt upset that he had waited 3 hours in the waiting room. Reported to Dr. Estell HarpinZammit.

## 2017-11-27 NOTE — ED Notes (Signed)
Pt signed hard copy of AMA form.  Pt has ambulated to the lobby at this time.  There are no further needs

## 2017-11-27 NOTE — ED Triage Notes (Signed)
Pt reports centralized non radiating chest pain that has been intermittent since last night. Pt is diaphoretic. Endorses shortness of breath and nausea. Hx of MI. Has some difficulty breathing while lying flat.

## 2017-11-27 NOTE — ED Notes (Signed)
ED Provider at bedside. 

## 2017-11-27 NOTE — ED Notes (Signed)
Pt insisted on walking back to room  I offered a w/c  His response was ive been sitting out here for 2 hours  Id one want a w/c  I told him it was a long walk  He refused to  Get in the chair  Very rude

## 2017-11-27 NOTE — ED Notes (Signed)
Pt given food. Pt refused IV and any further medication. Paging Dr. Rema JasmineKuhlman.

## 2017-11-27 NOTE — ED Provider Notes (Signed)
[facility] EMERGENCY DEPARTMENT Provider Note   CSN: [id number] Arrival date & time: 11/27/17  1524     History   Chief Complaint Chief Complaint  Patient presents with  . Chest Pain    HPI Jimmy Parker is a 47 y.o. male presenting after 2 episodes of central nonradiating chest pain.  First episode was last night while patient was driving, he reached back in the car and had a sharp substernal pain that lasted approximately 3 hours until the patient went to sleep.  Patient states that when he awoke he began having the pain again, the same central and sharp 7/10 in severity pain.  Patient also endorses diaphoresis during chest pain.  Patient took 1 nitro without relief.  Patient states the second episode of chest pain continued for several hours and finally dissipated when he was in the emergency department this afternoon.  Patient states that he has had similar pain in the past, patient with history of NSTEMI, hypertension, diabetes, CHF, hyperlipidemia.  Patient states that he is not being followed by cardiologist since his previous MI. HPI  Past Medical History:  Diagnosis Date  . CAD (coronary artery disease)    a. 07/2013 Neg MV;  b. 06/2015 NSTEMI/Cath:LM nl, LAD 20m,  D1/2/3 nl, RI mod/nl, LCX 59m, OM1 small, OM2 nl, RCA 20p, 124m, RPDA fills via collats from OM3, EF 35-45%.  . CHF (congestive heart failure) (HCC)   . Diet-controlled diabetes mellitus (HCC)   . Heart attack (HCC)   . Hyperlipidemia   . Hypertension   . Hypertensive heart disease   . Ischemic cardiomyopathy    a. 07/2013 Echo: EF 50-55%, no rwma, Gr1 DD;  b. 06/2015 EF 35-45% by Vgram.   . Tobacco abuse    a. quit 06/2015.    Patient Active Problem List   Diagnosis Date Noted  . CAD (coronary artery disease)   . Ischemic cardiomyopathy   . Diet-controlled diabetes mellitus (HCC)   . Hypertensive heart disease   . Tobacco abuse   . CAD- occluded RCA with collaterals 06/24/2015  .  NSTEMI (non-ST elevated myocardial infarction) (HCC) 06/23/2015  . Dyslipidemia 06/23/2015  . Chest pain 08/03/2013  . TOBACCO ABUSE 08/05/2009  . DYSMETABOLIC SYNDROME 08/13/2008  . Diabetes mellitus (HCC) 05/28/2008  . HYPERTENSION, BENIGN ESSENTIAL 06/08/2007    Past Surgical History:  Procedure Laterality Date  . CARDIAC CATHETERIZATION N/A 06/23/2015   Procedure: Left Heart Cath and Coronary Angiography;  Surgeon: Kathleene Hazel, MD;  Location: [facility] INVASIVE CV LAB;  Service: Cardiovascular;  Laterality: N/A;  . FRACTURE SURGERY     femur and hip  . SHOULDER SURGERY     Right: Patient denies any hx of shoulder surgery        Home Medications    Prior to Admission medications   Medication Sig Start Date End Date Taking? Authorizing Provider  amLODipine (NORVASC) 10 MG tablet Take 1 tablet (10 mg total) by mouth daily. 06/24/15   Abelino Derrick, PA-C  aspirin EC 81 MG EC tablet Take 1 tablet (81 mg total) by mouth daily. Patient taking differently: Take 81 mg by mouth at bedtime.  06/24/15   Abelino Derrick, PA-C  Chlorpheniramine Maleate (ALLERGY PO) Take 1 tablet by mouth daily.    [provider]  cholecalciferol (VITAMIN D) 1000 units tablet Take 4,000 Units by mouth daily.    [provider]  diphenhydrAMINE (BENADRYL) 25 MG tablet Take 1 tablet (25 mg  total) by mouth every 6 (six) hours as needed. 10/21/17   Raeford Razor, MD  hydrocortisone 2.5 % lotion Apply topically 2 (two) times daily. 10/21/17   Raeford Razor, MD  isosorbide mononitrate (IMDUR) 30 MG 24 hr tablet Take 0.5 tablets (15 mg total) by mouth daily. 07/04/15   Creig Hines, NP  lisinopril (PRINIVIL,ZESTRIL) 20 MG tablet Take 1 tablet by mouth daily. Does not know the dose     [provider]  metFORMIN (GLUCOPHAGE) 1000 MG tablet Take 0.5 tablets (500 mg total) by mouth 2 (two) times daily. 09/16/16   Corena Herter, MD  nitroGLYCERIN (NITROSTAT) 0.4 MG SL tablet  Place 1 tablet (0.4 mg total) under the tongue every 5 (five) minutes x 3 doses as needed for chest pain. Patient not taking: Reported on 09/16/2016 06/24/15   Abelino Derrick, PA-C    Family History Family History  Problem Relation Age of Onset  . Heart attack Maternal Grandfather   . Hypertension Maternal Grandfather   . Heart attack Paternal Grandmother     Social History Social History   Tobacco Use  . Smoking status: Former Smoker    Start date: 06/27/2015  . Smokeless tobacco: Never Used  Substance Use Topics  . Alcohol use: No    Alcohol/week: 0.0 standard drinks  . Drug use: No     Allergies   Patient has no known allergies.   Review of Systems Review of Systems  Constitutional: Positive for diaphoresis. Negative for chills and fever.  HENT: Negative.  Negative for rhinorrhea and sore throat.   Eyes: Negative.  Negative for visual disturbance.  Respiratory: Positive for shortness of breath. Negative for cough.   Cardiovascular: Positive for chest pain. Negative for leg swelling.  Gastrointestinal: Negative.  Negative for abdominal pain, blood in stool, diarrhea, nausea and vomiting.  Genitourinary: Negative.  Negative for dysuria and hematuria.  Musculoskeletal: Negative.  Negative for arthralgias and myalgias.  Skin: Negative.  Negative for rash.  Neurological: Negative.  Negative for dizziness, weakness and headaches.     Physical Exam Updated Vital Signs BP (!) 132/93   Pulse 84   Temp 98.6 F (37 C)   Resp 19   Ht 5\' 7"  (1.702 m)   Wt 77.1 kg   SpO2 97%   BMI 26.63 kg/m   Physical Exam  Constitutional: He is oriented to person, place, and time. He appears well-developed and well-nourished. No distress.  HENT:  Head: Normocephalic and atraumatic.  Right Ear: External ear normal.  Left Ear: External ear normal.  Nose: Nose normal.  Eyes: Pupils are equal, round, and reactive to light. EOM are normal.  Neck: Trachea normal and normal range of  motion. No tracheal deviation present.  Cardiovascular: Normal rate, regular rhythm, intact distal pulses and normal pulses.  No murmur heard. Pulmonary/Chest: Effort normal and breath sounds normal. No respiratory distress. He exhibits no tenderness, no crepitus and no deformity.  Chest pain is not reproducible on examination.    Abdominal: Soft. There is no tenderness. There is no rebound and no guarding.  Musculoskeletal: Normal range of motion.       Right lower leg: Normal. He exhibits no tenderness and no edema.       Left lower leg: Normal. He exhibits no tenderness and no edema.  Neurological: He is alert and oriented to person, place, and time.  Skin: Skin is warm and dry.  Psychiatric: He has a normal mood and affect. His behavior is normal.  ED Treatments / Results  Labs (all labs ordered are listed, but only abnormal results are displayed) Labs Reviewed  BASIC METABOLIC PANEL - Abnormal; Notable for the following components:      Result Value   Glucose, Bld 191 (*)    All other components within normal limits  CBC  BRAIN NATRIURETIC PEPTIDE  I-STAT TROPONIN, ED  I-STAT TROPONIN, ED    EKG EKG Interpretation  Date/Time:  Sunday November 27 2017 15:33:01 EDT Ventricular Rate:  112 PR Interval:  132 QRS Duration: 84 QT Interval:  328 QTC Calculation: 447 R Axis:   16 Text Interpretation:  Sinus tachycardia Otherwise normal ECG Confirmed by Bethann BerkshireZammit, Joseph [phone]([id number]) on 11/27/2017 6:05:53 PM Also confirmed by Bethann BerkshireZammit, Joseph [phone]([id number])  on 11/27/2017 7:23:21 PM   Radiology Dg Chest 2 View  Result Date: 11/27/2017 CLINICAL DATA:  Shortness of breath with cough and fever EXAM: CHEST - 2 VIEW COMPARISON:  January 24, 2017 FINDINGS: There is no evident edema or consolidation. The heart size and pulmonary vascularity are normal. No adenopathy. No pneumothorax. No bone lesions. IMPRESSION: No edema or consolidation. Electronically Signed   By: Bretta BangWilliam  Woodruff III M.D.   On:  11/27/2017 16:06    Procedures Procedures (including critical care time)  Medications Ordered in ED Medications  aspirin chewable tablet 324 mg (324 mg Oral Refused 11/27/17 2038)     Initial Impression / Assessment and Plan / ED Course  I have reviewed the triage vital signs and the nursing notes.  Pertinent labs & imaging results that were available during my care of the patient were reviewed by me and considered in my medical decision making (see chart for details).  Clinical Course as of Nov 28 2319  Wynelle LinkSun Nov 27, 2017  2021 Consulted with internal medicine who agree to see patient in ED and admit.   [BM]  2104 Informed by admitting physician that patient is refusing IV and aspirin at this time.  Informed by RN that floor nurses will not take patient without an IV for cardiac rule out.  I will attempt to convince patient.   [BM]  2115 I have spoken to the patient at length about benefits of IV and cardiac monitoring as well as hospital admission.  Patient adamantly refuses IV and hospital admission at this time, he states that he wishes to be discharged.  I have informed the patient if he decides against admission and to leave at this time it will be AGAINST MEDICAL ADVICE.  I have spoke to the patient about the risks of leaving AMA including heart attack, stroke, increased pain, disability and death.   [BM]  2130 Dr. Rema JasmineKuhlman informed of patient's decision to leave AMA.   [BM]  2318 Pt signed hard copy of AMA form.  Pt has ambulated to the lobby at this time.  There are no further needs    Electronically signed by Clarice PoleGorton, Ashley M, RN at 11/27/2017 9:29 PM   [BM]    Clinical Course User Index [BM] Bill SalinasMorelli, Emrys Mceachron A, PA-C  Patient with history of NSTEMI, coronary artery disease, diabetes, hypertension presenting after 2 episodes of central chest pain lasting multiple hours since last night.  Work-up in the emergency department shows negative chest x-ray, negative troponin x2,  nonacute BMP, CBC within normal limits, BNP pending. Patient's history of present illness and medical history are concerning patient with heart score of 5. Case discussed with Dr. Estell HarpinZammit, believe that patient warrants admission for further observation and cardiac rule  out.  Patient refusing IV and medications in the emergency department.  Patient seen and evaluated by Dr. Rema JasmineKuhlman who states that the patient is refusing IV and medication for him as well.  I have personally spoken to the patient at length about the benefits of IV and medication as well as admission.  Patient is refusing admission at this time as well as any and all interventions including medications and IV.  I informed patient that if he chooses to leave it will have to be AGAINST MEDICAL ADVICE due to concern of cardiac origin of his pain.  I personally discussed the risks of leaving AGAINST MEDICAL ADVICE with the patient including but not limited to heart attack, increased pain, stroke, disability and death. Patient states understanding of risks of leaving AGAINST MEDICAL ADVICE, patient is alert and oriented x4 and able to make his own medical decisions at this time.  I informed patient that he is able to come back to the emergency room for further evaluation and treatment at any time that he wishes.  Patient signed out AMA with nursing staff.   Final Clinical Impressions(s) / ED Diagnoses   Final diagnoses:  None    ED Discharge Orders    None       Elizabeth PalauMorelli, Kea Callan A, PA-C 11/27/17 2321    Bethann BerkshireZammit, Joseph, MD 11/29/17 1136

## 2018-03-07 ENCOUNTER — Encounter (HOSPITAL_COMMUNITY): Payer: Self-pay | Admitting: Emergency Medicine

## 2018-03-07 ENCOUNTER — Emergency Department (HOSPITAL_COMMUNITY)
Admission: EM | Admit: 2018-03-07 | Discharge: 2018-03-07 | Disposition: A | Discharge status: Home / Self Care | Payer: Self-pay | Attending: Emergency Medicine | Admitting: Emergency Medicine

## 2018-03-07 ENCOUNTER — Other Ambulatory Visit: Payer: Self-pay

## 2018-03-07 ENCOUNTER — Emergency Department (HOSPITAL_COMMUNITY): Payer: Self-pay

## 2018-03-07 DIAGNOSIS — I509 Heart failure, unspecified: Secondary | ICD-10-CM | POA: Insufficient documentation

## 2018-03-07 DIAGNOSIS — R739 Hyperglycemia, unspecified: Secondary | ICD-10-CM

## 2018-03-07 DIAGNOSIS — I110 Hypertensive heart disease with heart failure: Secondary | ICD-10-CM | POA: Insufficient documentation

## 2018-03-07 DIAGNOSIS — Z7984 Long term (current) use of oral hypoglycemic drugs: Secondary | ICD-10-CM | POA: Insufficient documentation

## 2018-03-07 DIAGNOSIS — E1165 Type 2 diabetes mellitus with hyperglycemia: Secondary | ICD-10-CM | POA: Insufficient documentation

## 2018-03-07 DIAGNOSIS — Z79899 Other long term (current) drug therapy: Secondary | ICD-10-CM | POA: Insufficient documentation

## 2018-03-07 DIAGNOSIS — M545 Low back pain, unspecified: Secondary | ICD-10-CM

## 2018-03-07 DIAGNOSIS — Z87891 Personal history of nicotine dependence: Secondary | ICD-10-CM | POA: Insufficient documentation

## 2018-03-07 DIAGNOSIS — R109 Unspecified abdominal pain: Secondary | ICD-10-CM

## 2018-03-07 DIAGNOSIS — M5442 Lumbago with sciatica, left side: Secondary | ICD-10-CM | POA: Insufficient documentation

## 2018-03-07 LAB — URINALYSIS, ROUTINE W REFLEX MICROSCOPIC
Bacteria, UA: NONE SEEN
Bilirubin Urine: NEGATIVE
Glucose, UA: >=500 mg/dL — AB
Hgb urine dipstick: NEGATIVE
Ketones, ur: 5 mg/dL — AB
Leukocytes, UA: NEGATIVE
Nitrite: NEGATIVE
PH: 5 (ref 5.0–8.0)
Protein, ur: NEGATIVE mg/dL
SPECIFIC GRAVITY, URINE: 1.04 — AB (ref 1.005–1.030)

## 2018-03-07 LAB — COMPREHENSIVE METABOLIC PANEL
ALBUMIN: 4.1 g/dL (ref 3.5–5.0)
ALT: 21 U/L (ref 0–44)
AST: 20 U/L (ref 15–41)
Alkaline Phosphatase: 78 U/L (ref 38–126)
Anion gap: 10 (ref 5–15)
BILIRUBIN TOTAL: 0.5 mg/dL (ref 0.3–1.2)
BUN: 13 mg/dL (ref 6–20)
CHLORIDE: 98 mmol/L (ref 98–111)
CO2: 25 mmol/L (ref 22–32)
CREATININE: 0.99 mg/dL (ref 0.61–1.24)
Calcium: 9.5 mg/dL (ref 8.9–10.3)
GFR calc Af Amer: >60 mL/min (ref >60)
GLUCOSE: 315 mg/dL — AB (ref 70–99)
POTASSIUM: 4.1 mmol/L (ref 3.5–5.1)
Sodium: 133 mmol/L — ABNORMAL LOW (ref 135–145)
Total Protein: 6.6 g/dL (ref 6.5–8.1)

## 2018-03-07 LAB — CBC WITH DIFFERENTIAL/PLATELET
ABS IMMATURE GRANULOCYTES: 0.03 10*3/uL (ref 0.00–0.07)
BASOS ABS: 0 10*3/uL (ref 0.0–0.1)
Basophils Relative: 1 %
Eosinophils Absolute: 0.3 10*3/uL (ref 0.0–0.5)
Eosinophils Relative: 4 %
HEMATOCRIT: 48 % (ref 39.0–52.0)
Hemoglobin: 15.4 g/dL (ref 13.0–17.0)
Immature Granulocytes: 0 %
LYMPHS ABS: 3.1 10*3/uL (ref 0.7–4.0)
Lymphocytes Relative: 38 %
MCH: 27 pg (ref 26.0–34.0)
MCHC: 32.1 g/dL (ref 30.0–36.0)
MCV: 84.2 fL (ref 80.0–100.0)
MONOS PCT: 8 %
Monocytes Absolute: 0.6 10*3/uL (ref 0.1–1.0)
NEUTROS ABS: 4 10*3/uL (ref 1.7–7.7)
NEUTROS PCT: 49 %
NRBC: 0 % (ref 0.0–0.2)
PLATELETS: 279 10*3/uL (ref 150–400)
RBC: 5.7 MIL/uL (ref 4.22–5.81)
RDW: 11.8 % (ref 11.5–15.5)
WBC: 8.1 10*3/uL (ref 4.0–10.5)

## 2018-03-07 MED ORDER — NAPROXEN 500 MG PO TABS: 500.0000 mg | ORAL_TABLET | Freq: Two times a day (BID) | ORAL | 0 refills | Status: DC

## 2018-03-07 MED ORDER — FENTANYL CITRATE (PF) 100 MCG/2ML IJ SOLN
50.0000 ug | Freq: Once | INTRAMUSCULAR | Status: AC
  Administered 2018-03-07: 50 ug via INTRAVENOUS
  Filled 2018-03-07: qty 2

## 2018-03-07 MED ORDER — METHOCARBAMOL 500 MG PO TABS: 500.0000 mg | ORAL_TABLET | Freq: Two times a day (BID) | ORAL | 0 refills | Status: DC

## 2018-03-07 NOTE — ED Notes (Signed)
ED Provider at bedside. 

## 2018-03-07 NOTE — ED Triage Notes (Signed)
Pt states that he is having left hip pain radiating to his back and down his left leg. Denies hx of sciatica or back problems. Reports that this pain has been ongoing for a couple of days but is worse today. States he has not taken anything for pain. Denies injury or any heavy lifting.

## 2018-03-07 NOTE — ED Notes (Signed)
Pt reports LLQ pain radiating to L flank. Pt reports dry cough. Pt denies nausea/vomiting. Pt denies change in urination.

## 2018-03-07 NOTE — ED Provider Notes (Signed)
[facility] EMERGENCY DEPARTMENT Provider Note   CSN: [id number] Arrival date & time: 03/07/18  4098     History   Chief Complaint Chief Complaint  Patient presents with  . Back Pain  . Hip Pain    HPI ADEEL GUIFFRE is a 47 y.o. male with a hx of CAD (NSTEMI 06/2015), CHF, NIDDM, HTN, ischemic cardiomyopathy (Echo 07/2013 with EF 50-55%) presents to the Emergency Department complaining of gradual, persistent, progressively worsening left flank and side pain onset 3 days ago.  Pt reports the initial pain was only a mild discomfort, coming and going.  He reports the pain returned tonight and became intense, rating it at a 7/10. He reports pain radiates into his left flank, left lower abd and down the posterior portion of his left leg. He denies numbness or weakness in his leg.  Pt reports he has been able to ambulate without difficulty, but reports that movement does make his pain worse.  He reports palpation does not worsen his pain.  No treatments PTA.  Pt reports some recent constipation but he was able to have a BM this morning after eating prunes.  Pt denies melena or hematochezia.  He denies penile pain, testicular pain, penile discharge or any urinary symptoms.  He also denies fever, chills, headache, neck pain, chest pain, SOB, N/V/D, weakness, dizziness, syncope, dysuria, hematuria.  He denies midline back pain, falls or known injuries.   The history is provided by the patient and medical records. No language interpreter was used.    Past Medical History:  Diagnosis Date  . CAD (coronary artery disease)    a. 07/2013 Neg MV;  b. 06/2015 NSTEMI/Cath:LM nl, LAD 86m,  D1/2/3 nl, RI mod/nl, LCX 37m, OM1 small, OM2 nl, RCA 20p, 172m, RPDA fills via collats from OM3, EF 35-45%.  . CHF (congestive heart failure) (HCC)   . Diet-controlled diabetes mellitus (HCC)   . Heart attack (HCC)   . Hyperlipidemia   . Hypertension   . Hypertensive heart disease   . Ischemic  cardiomyopathy    a. 07/2013 Echo: EF 50-55%, no rwma, Gr1 DD;  b. 06/2015 EF 35-45% by Vgram.   . Tobacco abuse    a. quit 06/2015.    Patient Active Problem List   Diagnosis Date Noted  . CAD (coronary artery disease)   . Ischemic cardiomyopathy   . Diet-controlled diabetes mellitus (HCC)   . Hypertensive heart disease   . Tobacco abuse   . CAD- occluded RCA with collaterals 06/24/2015  . NSTEMI (non-ST elevated myocardial infarction) (HCC) 06/23/2015  . Dyslipidemia 06/23/2015  . Chest pain 08/03/2013  . TOBACCO ABUSE 08/05/2009  . DYSMETABOLIC SYNDROME 08/13/2008  . Diabetes mellitus (HCC) 05/28/2008  . HYPERTENSION, BENIGN ESSENTIAL 06/08/2007    Past Surgical History:  Procedure Laterality Date  . CARDIAC CATHETERIZATION N/A 06/23/2015   Procedure: Left Heart Cath and Coronary Angiography;  Surgeon: Kathleene Hazel, MD;  Location: [facility] INVASIVE CV LAB;  Service: Cardiovascular;  Laterality: N/A;  . FRACTURE SURGERY     femur and hip  . SHOULDER SURGERY     Right: Patient denies any hx of shoulder surgery        Home Medications    Prior to Admission medications   Medication Sig Start Date End Date Taking? Authorizing Provider  amLODipine (NORVASC) 10 MG tablet Take 1 tablet (10 mg total) by mouth daily. 06/24/15  Yes Abelino Derrick, PA-C  aspirin EC 81 MG EC  tablet Take 1 tablet (81 mg total) by mouth daily. Patient taking differently: Take 81 mg by mouth at bedtime.  06/24/15  Yes Kilroy, Luke K, PA-C  atorvastatin (LIPITOR) 10 MG tablet Take 10 mg by mouth daily.   Yes [provider]  cholecalciferol (VITAMIN D) 1000 units tablet Take 4,000 Units by mouth daily.   Yes [provider]  diphenhydrAMINE (BENADRYL) 25 MG tablet Take 1 tablet (25 mg total) by mouth every 6 (six) hours as needed. Patient taking differently: Take 25 mg by mouth every 6 (six) hours as needed for allergies.  10/21/17  Yes Raeford Razor, MD  glipiZIDE (GLUCOTROL) 5 MG  tablet Take 5 mg by mouth 2 (two) times daily.   Yes [provider]  isosorbide mononitrate (IMDUR) 30 MG 24 hr tablet Take 0.5 tablets (15 mg total) by mouth daily. 07/04/15  Yes Creig Hines, NP  lisinopril (PRINIVIL,ZESTRIL) 20 MG tablet Take 0.5 tablets by mouth daily. Does not know the dose    Yes [provider]  metFORMIN (GLUCOPHAGE) 1000 MG tablet Take 0.5 tablets (500 mg total) by mouth 2 (two) times daily. Patient taking differently: Take 1,000 mg by mouth 2 (two) times daily.  09/16/16  Yes Mumma, Carollee Herter, MD  metoprolol tartrate (LOPRESSOR) 25 MG tablet Take 12.5 mg by mouth 2 (two) times daily.   Yes [provider]  nitroGLYCERIN (NITROSTAT) 0.4 MG SL tablet Place 1 tablet (0.4 mg total) under the tongue every 5 (five) minutes x 3 doses as needed for chest pain. 06/24/15  Yes Kilroy, Luke K, PA-C  omega-3 acid ethyl esters (LOVAZA) 1 g capsule Take 1 g by mouth daily.   Yes [provider]  sertraline (ZOLOFT) 100 MG tablet Take 100 mg by mouth daily.   Yes [provider]  hydrocortisone 2.5 % lotion Apply topically 2 (two) times daily. Patient not taking: Reported on 03/07/2018 10/21/17   Raeford Razor, MD  methocarbamol (ROBAXIN) 500 MG tablet Take 1 tablet (500 mg total) by mouth 2 (two) times daily. 03/07/18   Keon Benscoter, Dahlia Client, PA-C  naproxen (NAPROSYN) 500 MG tablet Take 1 tablet (500 mg total) by mouth 2 (two) times daily with a meal. 03/07/18   Mee Macdonnell, Dahlia Client, PA-C    Family History Family History  Problem Relation Age of Onset  . Heart attack Maternal Grandfather   . Hypertension Maternal Grandfather   . Heart attack Paternal Grandmother     Social History Social History   Tobacco Use  . Smoking status: Former Smoker    Start date: 06/27/2015  . Smokeless tobacco: Never Used  Substance Use Topics  . Alcohol use: No    Alcohol/week: 0.0 standard drinks  . Drug use: No     Allergies   Patient  has no known allergies.   Review of Systems Review of Systems  Constitutional: Negative for appetite change, diaphoresis, fatigue, fever and unexpected weight change.  HENT: Negative for mouth sores.   Eyes: Negative for visual disturbance.  Respiratory: Negative for cough, chest tightness, shortness of breath and wheezing.   Cardiovascular: Negative for chest pain.  Gastrointestinal: Negative for abdominal pain, constipation, diarrhea, nausea and vomiting.  Endocrine: Negative for polydipsia, polyphagia and polyuria.  Genitourinary: Positive for flank pain. Negative for dysuria, frequency, hematuria and urgency.  Musculoskeletal: Positive for back pain. Negative for neck stiffness.  Skin: Negative for rash.  Allergic/Immunologic: Negative for immunocompromised state.  Neurological: Negative for syncope, light-headedness and headaches.  Hematological: Does not  bruise/bleed easily.  Psychiatric/Behavioral: Negative for sleep disturbance. The patient is not nervous/anxious.      Physical Exam Updated Vital Signs BP 133/88 (BP Location: Right Arm)   Pulse 90   Temp 98.3 F (36.8 C) (Oral)   Resp 18   SpO2 99%   Physical Exam  Constitutional: He appears well-developed and well-nourished. No distress.  Awake, alert, nontoxic appearance Pt is uncomfortable appearing  HENT:  Head: Normocephalic and atraumatic.  Mouth/Throat: Oropharynx is clear and moist. No oropharyngeal exudate.  Eyes: Conjunctivae are normal. No scleral icterus.  Neck: Normal range of motion. Neck supple.  Cardiovascular: Normal rate, regular rhythm and intact distal pulses.  Pulmonary/Chest: Effort normal and breath sounds normal. No respiratory distress. He has no wheezes.  Equal chest expansion  Abdominal: Soft. Bowel sounds are normal. He exhibits no mass. There is no tenderness. There is no rebound and no guarding.  Soft and nontender No CVA tenderness Pt with LLQ abd pain and left flank pain, but not  reproducible with palpation.  Musculoskeletal: Normal range of motion. He exhibits no edema.  FROM of the t-spine and l-spine with left flank pain on movement.  No midline tenderness, step off or deformity.  No ttp along the left flank. Pain is not reproducible with palpation.  Neurological: He is alert.  Speech is clear and goal oriented Moves extremities without ataxia Strength 5/5 in the BLE extremities Sensation intact to normal touch in the BLE  Skin: Skin is warm and dry. He is not diaphoretic.  Psychiatric: He has a normal mood and affect.  Nursing note and vitals reviewed.    ED Treatments / Results  Labs (all labs ordered are listed, but only abnormal results are displayed) Labs Reviewed  COMPREHENSIVE METABOLIC PANEL - Abnormal; Notable for the following components:      Result Value   Sodium 133 (*)    Glucose, Bld 315 (*)    All other components within normal limits  URINALYSIS, ROUTINE W REFLEX MICROSCOPIC - Abnormal; Notable for the following components:   Specific Gravity, Urine 1.040 (*)    Glucose, UA >=500 (*)    Ketones, ur 5 (*)    All other components within normal limits  CBC WITH DIFFERENTIAL/PLATELET    EKG None  Radiology Ct L-spine No Charge  Result Date: 03/07/2018 CLINICAL DATA:  Low back pain EXAM: CT LUMBAR SPINE WITHOUT CONTRAST TECHNIQUE: Multidetector CT imaging of the lumbar spine was performed without intravenous contrast administration. Multiplanar CT image reconstructions were also generated. COMPARISON:  None. FINDINGS: Segmentation: 5 lumbar type vertebrae. Alignment: Normal. Vertebrae: No acute fracture or focal pathologic process. Paraspinal and other soft tissues: There is calcific aortic atherosclerosis. Disc levels: No spinal canal or neural foraminal stenosis. No large disc herniation is visible. IMPRESSION: Normal CT of the lumbar spine. Aortic atherosclerosis (ICD10-I70.0). Electronically Signed   By: Deatra RobinsonKevin  Herman M.D.   On:  03/07/2018 02:25   Ct Renal Stone Study  Result Date: 03/07/2018 CLINICAL DATA:  Flank pain. EXAM: CT ABDOMEN AND PELVIS WITHOUT CONTRAST TECHNIQUE: Multidetector CT imaging of the abdomen and pelvis was performed following the standard protocol without IV contrast. COMPARISON:  01/29/2012 FINDINGS: Lower chest: No acute findings. Hepatobiliary: No mass visualized on this unenhanced exam. Gallbladder is unremarkable. Pancreas: No mass or inflammatory process visualized on this unenhanced exam. Spleen:  Within normal limits in size. Adrenals/Urinary tract: No evidence of urolithiasis or hydronephrosis. Unremarkable unopacified urinary bladder. Stomach/Bowel: No evidence of obstruction, inflammatory process, or  abnormal fluid collections. Normal appendix visualized. Vascular/Lymphatic: No pathologically enlarged lymph nodes identified. No evidence of abdominal aortic aneurysm. Aortic atherosclerosis. Reproductive:  No mass or other significant abnormality. Other:  None. Musculoskeletal:  No suspicious bone lesions identified. IMPRESSION: No evidence of urolithiasis, hydronephrosis, or other acute findings. Electronically Signed   By: Myles Rosenthal M.D.   On: 03/07/2018 02:49    Procedures Procedures (including critical care time)  Medications Ordered in ED Medications  fentaNYL (SUBLIMAZE) injection 50 mcg (50 mcg Intravenous Given 03/07/18 0219)     Initial Impression / Assessment and Plan / ED Course  I have reviewed the triage vital signs and the nursing notes.  Pertinent labs & imaging results that were available during my care of the patient were reviewed by me and considered in my medical decision making (see chart for details).  Clinical Course as of Mar 08 327  Tue Mar 07, 2018  1610 Noted.  Pt reports he has not taken any of his medications since the pain began, including his glipizide and metformin.  Normal AG.    Glucose(!): 315 [HM]  0323 Concentrated.  Pt without UA ssx and no hgb   Specific Gravity, Urine(!): 1.040 [HM]  0324 Afebrile without tachycardia or hypotension. No signs of infection.  Temp: 98.3 F (36.8 C) [HM]    Clinical Course User Index [HM] Senovia Gauer, Dahlia Client, PA-C    Pt presents with left flank pain with radiation.  Labs reassuring.  Pt with hyperglycemia without DKA.  He has not taken his medications in several days.  He reports acute worsening of his pain tonight without numbness, tingling, weakness.  Normal gait here in the ED.  Normal strength and sensation in the lower extremities.  No midline pain.  No loss of bowel or bladder.  Doubt cauda equina.  CT renal without stone disease or bowel inflammation.  No evidence of diverticulitis or nephrolithiasis. Aorta has calcifications, but no evidence of AAA. Less likely dissection.  I personally evaluated these images. UA without evidence of UTI.  Pt denies urinary symptoms.  Suspect pain is sciatic/radiular in nature.  No red flags for back pain. Will give short course of anti-inflammatories and muscle relaxer.  Pt is to f/u with his PCP this week for further evaluation.  Discussed reasons to return immediately to the ED. Also encouraged pt to resume home medications.  Pt states understanding and is in agreement with the plan.   Final Clinical Impressions(s) / ED Diagnoses   Final diagnoses:  Low back pain  Acute left-sided low back pain with left-sided sciatica  Acute left flank pain  Hyperglycemia    ED Discharge Orders         Ordered    naproxen (NAPROSYN) 500 MG tablet  2 times daily with meals     03/07/18 0321    methocarbamol (ROBAXIN) 500 MG tablet  2 times daily     03/07/18 0321           Angelli Baruch, [place], PA-C 03/07/18 0331    Dione Booze, MD 03/07/18 [id number]

## 2018-03-07 NOTE — Discharge Instructions (Signed)
1. Medications: robaxin, naproxyn (for 7 days), usual home medications 2. Treatment: rest, drink plenty of fluids, gentle stretching as discussed, alternate ice and heat 3. Follow Up: Please followup with your primary doctor in 3 days for discussion of your diagnoses and further evaluation after today's visit;  Return to the ER for worsening back pain, difficulty walking, loss of bowel or bladder control or other concerning symptoms

## 2019-04-18 ENCOUNTER — Ambulatory Visit: Payer: BLUE CROSS/BLUE SHIELD | Attending: Internal Medicine

## 2019-04-18 DIAGNOSIS — Z20822 Contact with and (suspected) exposure to covid-19: Secondary | ICD-10-CM

## 2019-04-20 LAB — NOVEL CORONAVIRUS, NAA: SARS-CoV-2, NAA: DETECTED — AB

## 2019-04-26 ENCOUNTER — Encounter (HOSPITAL_COMMUNITY): Payer: Self-pay | Admitting: Emergency Medicine

## 2019-04-26 ENCOUNTER — Emergency Department (HOSPITAL_COMMUNITY): Payer: BLUE CROSS/BLUE SHIELD

## 2019-04-26 ENCOUNTER — Emergency Department (HOSPITAL_COMMUNITY)
Admission: EM | Admit: 2019-04-26 | Discharge: 2019-04-26 | Disposition: A | Discharge status: Home / Self Care | Payer: BLUE CROSS/BLUE SHIELD | Attending: Emergency Medicine | Admitting: Emergency Medicine

## 2019-04-26 ENCOUNTER — Other Ambulatory Visit: Payer: Self-pay

## 2019-04-26 DIAGNOSIS — I2510 Atherosclerotic heart disease of native coronary artery without angina pectoris: Secondary | ICD-10-CM | POA: Insufficient documentation

## 2019-04-26 DIAGNOSIS — U071 COVID-19: Secondary | ICD-10-CM | POA: Diagnosis not present

## 2019-04-26 DIAGNOSIS — Z87891 Personal history of nicotine dependence: Secondary | ICD-10-CM | POA: Insufficient documentation

## 2019-04-26 DIAGNOSIS — I509 Heart failure, unspecified: Secondary | ICD-10-CM | POA: Insufficient documentation

## 2019-04-26 DIAGNOSIS — E119 Type 2 diabetes mellitus without complications: Secondary | ICD-10-CM | POA: Diagnosis not present

## 2019-04-26 DIAGNOSIS — Z7984 Long term (current) use of oral hypoglycemic drugs: Secondary | ICD-10-CM | POA: Insufficient documentation

## 2019-04-26 DIAGNOSIS — Z7982 Long term (current) use of aspirin: Secondary | ICD-10-CM | POA: Insufficient documentation

## 2019-04-26 DIAGNOSIS — I110 Hypertensive heart disease with heart failure: Secondary | ICD-10-CM | POA: Insufficient documentation

## 2019-04-26 DIAGNOSIS — J1289 Other viral pneumonia: Secondary | ICD-10-CM | POA: Insufficient documentation

## 2019-04-26 DIAGNOSIS — Z79899 Other long term (current) drug therapy: Secondary | ICD-10-CM | POA: Insufficient documentation

## 2019-04-26 DIAGNOSIS — R0602 Shortness of breath: Secondary | ICD-10-CM

## 2019-04-26 LAB — COMPREHENSIVE METABOLIC PANEL
ALT: 18 U/L (ref 0–44)
AST: 26 U/L (ref 15–41)
Albumin: 3.2 g/dL — ABNORMAL LOW (ref 3.5–5.0)
Alkaline Phosphatase: 82 U/L (ref 38–126)
Anion gap: 13 (ref 5–15)
BUN: 7 mg/dL (ref 6–20)
CO2: 24 mmol/L (ref 22–32)
Calcium: 8.7 mg/dL — ABNORMAL LOW (ref 8.9–10.3)
Chloride: 94 mmol/L — ABNORMAL LOW (ref 98–111)
Creatinine, Ser: 1 mg/dL (ref 0.61–1.24)
GFR calc Af Amer: >60 mL/min (ref >60)
GFR calc non Af Amer: >60 mL/min (ref >60)
Glucose, Bld: 271 mg/dL — ABNORMAL HIGH (ref 70–99)
Potassium: 4.3 mmol/L (ref 3.5–5.1)
Sodium: 131 mmol/L — ABNORMAL LOW (ref 135–145)
Total Bilirubin: 0.7 mg/dL (ref 0.3–1.2)
Total Protein: 7.2 g/dL (ref 6.5–8.1)

## 2019-04-26 LAB — CBC WITH DIFFERENTIAL/PLATELET
Abs Immature Granulocytes: 0.06 10*3/uL (ref 0.00–0.07)
Basophils Absolute: 0 10*3/uL (ref 0.0–0.1)
Basophils Relative: 1 %
Eosinophils Absolute: 0.2 10*3/uL (ref 0.0–0.5)
Eosinophils Relative: 2 %
HCT: 48.8 % (ref 39.0–52.0)
Hemoglobin: 16.2 g/dL (ref 13.0–17.0)
Immature Granulocytes: 1 %
Lymphocytes Relative: 22 %
Lymphs Abs: 1.7 10*3/uL (ref 0.7–4.0)
MCH: 28 pg (ref 26.0–34.0)
MCHC: 33.2 g/dL (ref 30.0–36.0)
MCV: 84.4 fL (ref 80.0–100.0)
Monocytes Absolute: 0.7 10*3/uL (ref 0.1–1.0)
Monocytes Relative: 8 %
Neutro Abs: 5.2 10*3/uL (ref 1.7–7.7)
Neutrophils Relative %: 66 %
Platelets: 284 10*3/uL (ref 150–400)
RBC: 5.78 MIL/uL (ref 4.22–5.81)
RDW: 11.4 % — ABNORMAL LOW (ref 11.5–15.5)
WBC: 7.9 10*3/uL (ref 4.0–10.5)
nRBC: 0 % (ref 0.0–0.2)

## 2019-04-26 LAB — URINALYSIS, ROUTINE W REFLEX MICROSCOPIC
Bacteria, UA: NONE SEEN
Bilirubin Urine: NEGATIVE
Glucose, UA: >=500 mg/dL — AB
Hgb urine dipstick: NEGATIVE
Ketones, ur: 20 mg/dL — AB
Leukocytes,Ua: NEGATIVE
Nitrite: NEGATIVE
Protein, ur: 30 mg/dL — AB
Specific Gravity, Urine: 1.036 — ABNORMAL HIGH (ref 1.005–1.030)
pH: 5 (ref 5.0–8.0)

## 2019-04-26 MED ORDER — ACETAMINOPHEN 325 MG PO TABS
650.0000 mg | ORAL_TABLET | Freq: Once | ORAL | Status: AC
  Administered 2019-04-26: 650 mg via ORAL
  Filled 2019-04-26: qty 2

## 2019-04-26 MED ORDER — BENZONATATE 100 MG PO CAPS: 100.0000 mg | ORAL_CAPSULE | Freq: Three times a day (TID) | ORAL | 0 refills | Status: AC

## 2019-04-26 NOTE — ED Provider Notes (Signed)
[facility] EMERGENCY DEPARTMENT Provider Note   CSN: [id number] Arrival date & time: 04/26/19  1919     History Chief Complaint  Patient presents with  . Covid +  . Shortness of Breath    Jimmy Parker is a 49 y.o. male.  49 y.o male with a PMH of CAD,DM,HTN, CHF presents to the ED with a chief complaint of covid 19 infection. Patient was diagnosed with Covid 19 on 04/17/2018 approximately 8 days ago. He reports at first he did not have any symptoms, however these have now worsen. He reports loss of taste, loss of smell, a wet cough. He also reports a Tmax of 101, has been taking tylenol for his fever with improvement. He also reports taking Mucinex DM which has helped with his symptoms. He reports chest tightness this only occurs when he is having coughing fits. He denies any headache, prior history of blood clots, or leg swelling.    Shortness of Breath Severity:  Mild Onset quality:  Gradual Duration:  7 days Timing:  Constant Progression:  Unchanged Chronicity:  New Context: URI   Context: not fumes and not smoke exposure   Relieved by:  Nothing Worsened by:  Coughing Ineffective treatments:  Rest Associated symptoms: cough, diaphoresis and fever   Associated symptoms: no abdominal pain, no chest pain, no headaches, no hemoptysis, no rash, no sore throat, no syncope, no vomiting and no wheezing   Risk factors: no family hx of DVT, no hx of PE/DVT, no obesity, no recent surgery and no tobacco use        Past Medical History:  Diagnosis Date  . CAD (coronary artery disease)    a. 07/2013 Neg MV;  b. 06/2015 NSTEMI/Cath:LM nl, LAD 43m,  D1/2/3 nl, RI mod/nl, LCX 71m, OM1 small, OM2 nl, RCA 20p, 154m, RPDA fills via collats from OM3, EF 35-45%.  . CHF (congestive heart failure) (HCC)   . Diet-controlled diabetes mellitus (HCC)   . Heart attack (HCC)   . Hyperlipidemia   . Hypertension   . Hypertensive heart disease   . Ischemic cardiomyopathy    a.  07/2013 Echo: EF 50-55%, no rwma, Gr1 DD;  b. 06/2015 EF 35-45% by Vgram.   . Tobacco abuse    a. quit 06/2015.    Patient Active Problem List   Diagnosis Date Noted  . CAD (coronary artery disease)   . Ischemic cardiomyopathy   . Diet-controlled diabetes mellitus (HCC)   . Hypertensive heart disease   . Tobacco abuse   . CAD- occluded RCA with collaterals 06/24/2015  . NSTEMI (non-ST elevated myocardial infarction) (HCC) 06/23/2015  . Dyslipidemia 06/23/2015  . Chest pain 08/03/2013  . TOBACCO ABUSE 08/05/2009  . DYSMETABOLIC SYNDROME 08/13/2008  . Diabetes mellitus (HCC) 05/28/2008  . HYPERTENSION, BENIGN ESSENTIAL 06/08/2007    Past Surgical History:  Procedure Laterality Date  . CARDIAC CATHETERIZATION N/A 06/23/2015   Procedure: Left Heart Cath and Coronary Angiography;  Surgeon: Kathleene Hazel, MD;  Location: [facility] INVASIVE CV LAB;  Service: Cardiovascular;  Laterality: N/A;  . FRACTURE SURGERY     femur and hip  . SHOULDER SURGERY     Right: Patient denies any hx of shoulder surgery       Family History  Problem Relation Age of Onset  . Heart attack Maternal Grandfather   . Hypertension Maternal Grandfather   . Heart attack Paternal Grandmother     Social History   Tobacco Use  . Smoking status:  Former Smoker    Start date: 06/27/2015  . Smokeless tobacco: Never Used  Substance Use Topics  . Alcohol use: No    Alcohol/week: 0.0 standard drinks  . Drug use: No    Home Medications Prior to Admission medications   Medication Sig Start Date End Date Taking? Authorizing Provider  acetaminophen (TYLENOL) 160 MG/5ML liquid Take 500 mg by mouth 2 (two) times daily.   Yes [provider]  amLODipine (NORVASC) 10 MG tablet Take 1 tablet (10 mg total) by mouth daily. 06/24/15  Yes Erlene Quan, PA-C  aspirin EC 81 MG EC tablet Take 1 tablet (81 mg total) by mouth daily. 06/24/15  Yes Kilroy, Luke K, PA-C  atorvastatin (LIPITOR) 10 MG tablet Take 10 mg  by mouth daily.   Yes [provider]  cholecalciferol (VITAMIN D) 1000 units tablet Take 4,000 Units by mouth daily.   Yes [provider]  diphenhydrAMINE (BENADRYL) 25 MG tablet Take 1 tablet (25 mg total) by mouth every 6 (six) hours as needed. Patient taking differently: Take 25 mg by mouth every 6 (six) hours as needed for allergies.  10/21/17  Yes Virgel Manifold, MD  glipiZIDE (GLUCOTROL) 5 MG tablet Take 5 mg by mouth 2 (two) times daily.   Yes [provider]  isosorbide mononitrate (IMDUR) 30 MG 24 hr tablet Take 0.5 tablets (15 mg total) by mouth daily. 07/04/15  Yes Theora Gianotti, NP  JANUMET 50-1000 MG tablet Take 1 tablet by mouth 2 (two) times daily. 02/09/19  Yes [provider]  lisinopril (PRINIVIL,ZESTRIL) 20 MG tablet Take 10 mg by mouth daily. Does not know the dose    Yes [provider]  metFORMIN (GLUCOPHAGE) 1000 MG tablet Take 0.5 tablets (500 mg total) by mouth 2 (two) times daily. Patient taking differently: Take 1,000 mg by mouth 2 (two) times daily.  09/16/16  Yes Mumma, Larene Beach, MD  metoprolol tartrate (LOPRESSOR) 25 MG tablet Take 12.5 mg by mouth 2 (two) times daily.   Yes [provider]  nitroGLYCERIN (NITROSTAT) 0.4 MG SL tablet Place 1 tablet (0.4 mg total) under the tongue every 5 (five) minutes x 3 doses as needed for chest pain. 06/24/15  Yes Kilroy, Luke K, PA-C  sertraline (ZOLOFT) 100 MG tablet Take 100 mg by mouth daily.   Yes [provider]  benzonatate (TESSALON) 100 MG capsule Take 1 capsule (100 mg total) by mouth every 8 (eight) hours for 7 days. 04/26/19 05/03/19  Janeece Fitting, PA-C  hydrocortisone 2.5 % lotion Apply topically 2 (two) times daily. Patient not taking: Reported on 03/07/2018 10/21/17   Virgel Manifold, MD  methocarbamol (ROBAXIN) 500 MG tablet Take 1 tablet (500 mg total) by mouth 2 (two) times daily. Patient not taking: Reported on 04/26/2019 03/07/18   Muthersbaugh,  Jarrett Soho, PA-C  naproxen (NAPROSYN) 500 MG tablet Take 1 tablet (500 mg total) by mouth 2 (two) times daily with a meal. Patient not taking: Reported on 04/26/2019 03/07/18   Muthersbaugh, Jarrett Soho, PA-C    Allergies    Patient has no known allergies.  Review of Systems   Review of Systems  Constitutional: Positive for diaphoresis and fever.  HENT: Negative for sore throat.   Respiratory: Positive for cough and shortness of breath. Negative for hemoptysis and wheezing.   Cardiovascular: Negative for chest pain and syncope.  Gastrointestinal: Negative for abdominal pain, nausea and vomiting.  Genitourinary: Negative for flank pain.  Musculoskeletal: Negative for back pain.  Skin: Negative  for rash.  Neurological: Negative for syncope, light-headedness and headaches.    Physical Exam Updated Vital Signs BP 123/86 (BP Location: Left Arm)   Pulse (!) 104   Temp 98 F (36.7 C) (Oral)   Resp 16   Ht 5\' 6"  (1.676 m)   Wt 77.1 kg   SpO2 97%   BMI 27.44 kg/m   Physical Exam Vitals and nursing note reviewed.  Constitutional:      Appearance: He is well-developed.  HENT:     Head: Normocephalic and atraumatic.  Eyes:     General: No scleral icterus.    Pupils: Pupils are equal, round, and reactive to light.  Cardiovascular:     Heart sounds: Normal heart sounds.     Comments: No bilateral pitting edema.  No calf tenderness bilaterally. Pulmonary:     Effort: Pulmonary effort is normal.     Breath sounds: Examination of the right-lower field reveals decreased breath sounds. Examination of the left-lower field reveals decreased breath sounds. Decreased breath sounds present. No wheezing.  Chest:     Chest wall: No tenderness.  Abdominal:     General: Bowel sounds are normal. There is no distension.     Palpations: Abdomen is soft.     Tenderness: There is no abdominal tenderness.  Musculoskeletal:        General: No tenderness or deformity.     Cervical back: Normal range of  motion.     Right lower leg: No tenderness. No edema.     Left lower leg: No tenderness. No edema.  Skin:    General: Skin is warm and dry.  Neurological:     Mental Status: He is alert and oriented to person, place, and time.     ED Results / Procedures / Treatments   Labs (all labs ordered are listed, but only abnormal results are displayed) Labs Reviewed  CBC WITH DIFFERENTIAL/PLATELET - Abnormal; Notable for the following components:      Result Value   RDW 11.4 (*)    All other components within normal limits  COMPREHENSIVE METABOLIC PANEL - Abnormal; Notable for the following components:   Sodium 131 (*)    Chloride 94 (*)    Glucose, Bld 271 (*)    Calcium 8.7 (*)    Albumin 3.2 (*)    All other components within normal limits  URINALYSIS, ROUTINE W REFLEX MICROSCOPIC - Abnormal; Notable for the following components:   Specific Gravity, Urine 1.036 (*)    Glucose, UA >=500 (*)    Ketones, ur 20 (*)    Protein, ur 30 (*)    All other components within normal limits    EKG None  Radiology DG Chest Portable 1 View  Result Date: 04/26/2019 CLINICAL DATA:  Shortness of breath, COVID positive EXAM: PORTABLE CHEST 1 VIEW COMPARISON:  11/27/2017 FINDINGS: Right greater than left peripheral lung opacities. Normal heart size. No pneumothorax or pleural effusion IMPRESSION: Right greater than left peripheral lung opacities consistent with pneumonia and history of COVID positivity. Electronically Signed   By: 11/29/2017 M.D.   On: 04/26/2019 21:35    Procedures Procedures (including critical care time)  Medications Ordered in ED Medications  acetaminophen (TYLENOL) tablet 650 mg (650 mg Oral Given 04/26/19 1946)    ED Course  I have reviewed the triage vital signs and the nursing notes.  Pertinent labs & imaging results that were available during my care of the patient were reviewed by me and considered in  my medical decision making (see chart for details).    MDM  Rules/Calculators/A&P     Patient with a past medical history of CAD, diabetes, hypertension presents to the ED with a chief complaint of shortness of breath.  Patient reports he tested positive for COVID-19 approximately 8 days ago, he was originally asymptomatic however has now developed symptoms.  These include change in taste and smell, loss of taste and smell, fever, body aches, shortness of breath.  He also endorses a cough, this occurs when he has coughing fits, cough is nonproductive.  Reports he feels somewhat winded however this occurs with exertion.  He denies any chest pain, does have a previous history of CAD in 2016, reports his chest does feel sore but this only occurs after he coughs a lot.  More so at night.  During my evaluation patient is overall well-appearing, stats are 97% on room air without tachypnea, tachycardia.  Lungs were diminished to auscultation on the lower lung fields.  There is no tachypnea present, no increased work of breathing.  He was ambulated by me while with pulse ox, he remained between 96 to 97% on room air without any tachycardia.  Patient does report his symptoms are somewhat unchanged, he is getting relief with Mucinex along with Tylenol.  Fever was rechecked after receiving Tylenol, he is afebrile now.  Vitals are within normal limits.  CBC without any leukocytosis, hemoglobin is within normal limits.  CMP remarkable for some mild hyponatremia, glucose is elevated at 271, did not take his Metformin today. When questioned about diabetes patient reports "I believe I might have that situation".  Creatinine level is within normal limits, LFTs are unremarkable.  No anion gap, lower suspicion for DKA. UA with some ketones, protein but no signs of infection.  A chest x-ray was obtained which showed  X-ray of his chest showed: Right greater than left peripheral lung opacities consistent with  pneumonia and history of COVID positivity.    These results were  discussed with patient at length.  We discussed the risks and benefits of antibiotic therapy in order to treat a viral pneumonia.  Patient is agreeable of not going home on antibiotics at this time.  We will give him a prescription for Tessalon Pearls to help with his cough as he reports this is bothersome at night.  Patient is otherwise stable, ambulatory with a steady gait.  Patient stable for discharge.   Portions of this note were generated with Scientist, clinical (histocompatibility and immunogenetics). Dictation errors may occur despite best attempts at proofreading.  Final Clinical Impression(s) / ED Diagnoses Final diagnoses:  Shortness of breath  COVID-19 virus infection    Rx / DC Orders ED Discharge Orders         Ordered    benzonatate (TESSALON) 100 MG capsule  Every 8 hours     04/26/19 2225           Claude Manges, PA-C 04/26/19 2239    Terald Sleeper, MD 04/27/19 1434

## 2019-04-26 NOTE — ED Notes (Signed)
Discharge instructions reviewed with pt. Pt verbalized understanding.   

## 2019-04-26 NOTE — ED Triage Notes (Signed)
Pt to ED with c/o shortness of breath and fever.  Pt st's he was tested on 1/5 or 1/6 and was called and told he was positive.  Pt st's he didn't start having symptoms till a few days later. Pt speaking in full sentences

## 2019-04-26 NOTE — Discharge Instructions (Addendum)
Your laboratory results are within normal limits today.  Your chest x-ray did show pneumonia on the basis of your lungs.  I have provided a prescription for medication that should help with your cough, please take this as needed.

## 2019-04-26 NOTE — ED Notes (Signed)
Per PA, pt ambulated inside the room. Pt stable on feet. O2Sat 95-96% on RA

## 2019-07-23 ENCOUNTER — Other Ambulatory Visit: Payer: Self-pay

## 2019-07-23 ENCOUNTER — Ambulatory Visit: Payer: BLUE CROSS/BLUE SHIELD | Admitting: Interventional Cardiology

## 2019-07-23 VITALS — BP 138/84 | HR 97 | Ht 66.0 in | Wt 164.8 lb

## 2019-07-23 DIAGNOSIS — I25118 Atherosclerotic heart disease of native coronary artery with other forms of angina pectoris: Secondary | ICD-10-CM

## 2019-07-23 DIAGNOSIS — E782 Mixed hyperlipidemia: Secondary | ICD-10-CM

## 2019-07-23 DIAGNOSIS — I10 Essential (primary) hypertension: Secondary | ICD-10-CM | POA: Diagnosis not present

## 2019-07-23 DIAGNOSIS — Z720 Tobacco use: Secondary | ICD-10-CM

## 2019-07-23 DIAGNOSIS — I252 Old myocardial infarction: Secondary | ICD-10-CM | POA: Diagnosis not present

## 2019-07-23 MED ORDER — ROSUVASTATIN CALCIUM 20 MG PO TABS: 20.0000 mg | ORAL_TABLET | Freq: Every day | ORAL | 3 refills | Status: DC

## 2019-07-23 NOTE — Patient Instructions (Addendum)
Medication Instructions:  Your physician has recommended you make the following change in your medication:   1. STOP: atorvastatin  2. START: rosuvastatin (crestor) 20 mg tablet: Take 1 tablet by mouth once a day  *If you need a refill on your cardiac medications before your next appointment, please call your pharmacy*   Lab Work: Your physician recommends that you return for a FASTING lipid profile and liver function panel on 10/22/19  If you have labs (blood work) drawn today and your tests are completely normal, you will receive your results only by: Marland Kitchen MyChart Message (if you have MyChart) OR . A paper copy in the mail If you have any lab test that is abnormal or we need to change your treatment, we will call you to review the results.   Testing/Procedures: None ordered   Follow-Up: At [facility], you and your health needs are our priority.  As part of our continuing mission to provide you with exceptional heart care, we have created designated Provider Care Teams.  These Care Teams include your primary Cardiologist (physician) and Advanced Practice Providers (APPs -  Physician Assistants and Nurse Practitioners) who all work together to provide you with the care you need, when you need it.  We recommend signing up for the patient portal called "MyChart".  Sign up information is provided on this After Visit Summary.  MyChart is used to connect with patients for Virtual Visits (Telemedicine).  Patients are able to view lab/test results, encounter notes, upcoming appointments, etc.  Non-urgent messages can be sent to your provider as well.   To learn more about what you can do with MyChart, go to [email or web address].    Your next appointment:   6 month(s)  The format for your next appointment:   In Person  Provider:   You may see Lance Muss, MD or one of the following Advanced Practice Providers on your designated Care Team:    Ronie Spies, PA-C  Jacolyn Reedy,  PA-C    Other Instructions  High-Fiber Diet Fiber, also called dietary fiber, is a type of carbohydrate that is found in fruits, vegetables, whole grains, and beans. A high-fiber diet can have many health benefits. Your health care provider may recommend a high-fiber diet to help:  Prevent constipation. Fiber can make your bowel movements more regular.  Lower your cholesterol.  Relieve the following conditions: ? Swelling of veins in the anus (hemorrhoids). ? Swelling and irritation (inflammation) of specific areas of the digestive tract (uncomplicated diverticulosis). ? A problem of the large intestine (colon) that sometimes causes pain and diarrhea (irritable bowel syndrome, IBS).  Prevent overeating as part of a weight-loss plan.  Prevent heart disease, type 2 diabetes, and certain cancers. What is my plan? The recommended daily fiber intake in grams (g) includes:  38 g for men age 64 or younger.  30 g for men over age 90.  25 g for women age 2 or younger.  21 g for women over age 19. You can get the recommended daily intake of dietary fiber by:  Eating a variety of fruits, vegetables, grains, and beans.  Taking a fiber supplement, if it is not possible to get enough fiber through your diet. What do I need to know about a high-fiber diet?  It is better to get fiber through food sources rather than from fiber supplements. There is not a lot of research about how effective supplements are.  Always check the fiber content on the nutrition facts label  of any prepackaged food. Look for foods that contain 5 g of fiber or more per serving.  Talk with a diet and nutrition specialist (dietitian) if you have questions about specific foods that are recommended or not recommended for your medical condition, especially if those foods are not listed below.  Gradually increase how much fiber you consume. If you increase your intake of dietary fiber too quickly, you may have bloating,  cramping, or gas.  Drink plenty of water. Water helps you to digest fiber. What are tips for following this plan?  Eat a wide variety of high-fiber foods.  Make sure that half of the grains that you eat each day are whole grains.  Eat breads and cereals that are made with whole-grain flour instead of refined flour or white flour.  Eat brown rice, bulgur wheat, or millet instead of white rice.  Start the day with a breakfast that is high in fiber, such as a cereal that contains 5 g of fiber or more per serving.  Use beans in place of meat in soups, salads, and pasta dishes.  Eat high-fiber snacks, such as berries, raw vegetables, nuts, and popcorn.  Choose whole fruits and vegetables instead of processed forms like juice or sauce. What foods can I eat?  Fruits Berries. Pears. Apples. Oranges. Avocado. Prunes and raisins. Dried figs. Vegetables Sweet potatoes. Spinach. Kale. Artichokes. Cabbage. Broccoli. Cauliflower. Green peas. Carrots. Squash. Grains Whole-grain breads. Multigrain cereal. Oats and oatmeal. Brown rice. Barley. Bulgur wheat. [place]. Quinoa. Bran muffins. Popcorn. Rye wafer crackers. Meats and other proteins Navy, kidney, and pinto beans. Soybeans. Split peas. Lentils. Nuts and seeds. Dairy Fiber-fortified yogurt. Beverages Fiber-fortified soy milk. Fiber-fortified orange juice. Other foods Fiber bars. The items listed above may not be a complete list of recommended foods and beverages. Contact a dietitian for more options. What foods are not recommended? Fruits Fruit juice. Cooked, strained fruit. Vegetables Fried potatoes. Canned vegetables. Well-cooked vegetables. Grains White bread. Pasta made with refined flour. White rice. Meats and other proteins Fatty cuts of meat. Fried chicken or fried fish. Dairy Milk. Yogurt. Cream cheese. Sour cream. Fats and oils Butters. Beverages Soft drinks. Other foods Cakes and pastries. The items listed above  may not be a complete list of foods and beverages to avoid. Contact a dietitian for more information. Summary  Fiber is a type of carbohydrate. It is found in fruits, vegetables, whole grains, and beans.  There are many health benefits of eating a high-fiber diet, such as preventing constipation, lowering blood cholesterol, helping with weight loss, and reducing your risk of heart disease, diabetes, and certain cancers.  Gradually increase your intake of fiber. Increasing too fast can result in cramping, bloating, and gas. Drink plenty of water while you increase your fiber.  The best sources of fiber include whole fruits and vegetables, whole grains, nuts, seeds, and beans. This information is not intended to replace advice given to you by your health care provider. Make sure you discuss any questions you have with your health care provider. Document Revised: 01/31/2017 Document Reviewed: 01/31/2017 Elsevier Patient Education  2020 [employer].

## 2019-07-23 NOTE — Progress Notes (Signed)
Cardiology Office Note   Date:  07/23/2019   ID:  Jimmy Parker, DOB 12/01/1970, MRN [id number]  PCP:  [facility]    No chief complaint on file.  CAD  Wt Readings from Last 3 Encounters:  07/23/19 164 lb 12.8 oz (74.8 kg)  04/26/19 170 lb (77.1 kg)  11/27/17 170 lb (77.1 kg)       History of Present Illness: Jimmy Parker is a 49 y.o. male who is being seen today for the evaluation of CAD at the request of Jimmy Parker.   He had an MI in 2017.  Cath showed: "Prox RCA lesion, 20% stenosed.  Mid RCA lesion, 100% stenosed.  Mid Cx lesion, 20% stenosed.  Prox LAD to Mid LAD lesion, 20% stenosed.  There is moderate left ventricular systolic dysfunction.   1. Complete occlusion of the mid RCA with filling of the distal RCA branches by left to right collaterals.  2. Mild non-obstructive plaque in the LAD and Circumflex 3. Moderate segmental LV systolic dysfunction  Recommendations: His infarct likely happened over 48 hours ago. The RCA is now completely occluded with good collateral filling. Will not attempt to open vessel given chronicity. Aggressive risk factor modification. "  Sx with heart attack were "like the flu."  He had some chest pain/tightness.  He had to lie down for the night.    He had COVID in Jan 2021. He has been vaccinated.  He was able to stay home after a trip to the ER. He had a cough.       Past Medical History:  Diagnosis Date  . CAD (coronary artery disease)    a. 07/2013 Neg MV;  b. 06/2015 NSTEMI/Cath:LM nl, LAD 75m,  D1/2/3 nl, RI mod/nl, LCX 93m, OM1 small, OM2 nl, RCA 20p, 172m, RPDA fills via collats from OM3, EF 35-45%.  Marland Kitchen CAD- occluded RCA with collaterals 06/24/2015  . Chest pain 08/03/2013  . CHF (congestive heart failure) (HCC)   . Diabetes mellitus (HCC) 05/28/2008   Qualifier: Diagnosis of  By: Levon Hedger    . Diet-controlled diabetes mellitus (HCC)   . Dyslipidemia 06/23/2015  . DYSMETABOLIC  SYNDROME 08/13/2008   Qualifier: Diagnosis of  By: Daphine Deutscher FNP, Zena Amos    . Heart attack (HCC)   . Hyperlipidemia   . Hypertension   . Hypertensive heart disease   . Ischemic cardiomyopathy    a. 07/2013 Echo: EF 50-55%, no rwma, Gr1 DD;  b. 06/2015 EF 35-45% by Vgram.   . NSTEMI (non-ST elevated myocardial infarction) (HCC) 06/23/2015  . Tobacco abuse    a. quit 06/2015.  Marland Kitchen TOBACCO ABUSE 08/05/2009   Qualifier: Diagnosis of  By: Levon Hedger      Past Surgical History:  Procedure Laterality Date  . CARDIAC CATHETERIZATION N/A 06/23/2015   Procedure: Left Heart Cath and Coronary Angiography;  Surgeon: Kathleene Hazel, Parker;  Location: [facility] INVASIVE CV LAB;  Service: Cardiovascular;  Laterality: N/A;  . FRACTURE SURGERY     femur and hip  . SHOULDER SURGERY     Right: Patient denies any hx of shoulder surgery     Current Outpatient Medications  Medication Sig Dispense Refill  . acetaminophen (TYLENOL) 160 MG/5ML liquid Take 500 mg by mouth 2 (two) times daily.    Marland Kitchen amLODipine (NORVASC) 10 MG tablet Take 1 tablet (10 mg total) by mouth daily. 90 tablet 3  . aspirin EC 81 MG EC tablet Take 1 tablet (  81 mg total) by mouth daily.    Marland Kitchen atorvastatin (LIPITOR) 10 MG tablet Take 10 mg by mouth daily.    . cholecalciferol (VITAMIN D) 1000 units tablet Take 4,000 Units by mouth daily.    . diphenhydrAMINE (BENADRYL) 25 MG tablet Take 1 tablet (25 mg total) by mouth every 6 (six) hours as needed. (Patient taking differently: Take 25 mg by mouth every 6 (six) hours as needed for allergies. ) 20 tablet 0  . glipiZIDE (GLUCOTROL) 5 MG tablet Take 5 mg by mouth 2 (two) times daily.    . hydrocortisone 2.5 % lotion Apply topically 2 (two) times daily. 59 mL 0  . isosorbide mononitrate (IMDUR) 30 MG 24 hr tablet Take 0.5 tablets (15 mg total) by mouth daily. 45 tablet 3  . JANUMET 50-1000 MG tablet Take 1 tablet by mouth 2 (two) times daily.    Marland Kitchen lisinopril (PRINIVIL,ZESTRIL) 20 MG tablet Take 10  mg by mouth daily. Does not know the dose     . metFORMIN (GLUCOPHAGE) 1000 MG tablet Take 0.5 tablets (500 mg total) by mouth 2 (two) times daily. (Patient taking differently: Take 1,000 mg by mouth 2 (two) times daily. ) 30 tablet 0  . methocarbamol (ROBAXIN) 500 MG tablet Take 1 tablet (500 mg total) by mouth 2 (two) times daily. 14 tablet 0  . metoprolol tartrate (LOPRESSOR) 25 MG tablet Take 12.5 mg by mouth 2 (two) times daily.    . naproxen (NAPROSYN) 500 MG tablet Take 1 tablet (500 mg total) by mouth 2 (two) times daily with a meal. 14 tablet 0  . nitroGLYCERIN (NITROSTAT) 0.4 MG SL tablet Place 1 tablet (0.4 mg total) under the tongue every 5 (five) minutes x 3 doses as needed for chest pain. 25 tablet 2  . sertraline (ZOLOFT) 100 MG tablet Take 100 mg by mouth daily.     No current facility-administered medications for this visit.    Allergies:   Patient has no known allergies.    Social History:  The patient  reports that he has quit smoking. He started smoking about 4 years ago. He has never used smokeless tobacco. He reports that he does not drink alcohol or use drugs.   Family History:  The patient's family history includes Heart attack in his maternal grandfather and paternal grandmother; Hypertension in his maternal grandfather.    ROS:  Please see the history of present illness.   Otherwise, review of systems are positive for aches with atorvastatin affecting compliance.   All other systems are reviewed and negative.    PHYSICAL EXAM: VS:  BP 138/84   Pulse 97   Ht 5\' 6"  (1.676 m)   Wt 164 lb 12.8 oz (74.8 kg)   SpO2 97%   BMI 26.60 kg/m  , BMI Body mass index is 26.6 kg/m. GEN: Well nourished, well developed, in no acute distress  HEENT: normal  Neck: no JVD, carotid bruits, or masses Cardiac: RRR; no murmurs, rubs, or gallops,no edema  Respiratory:  clear to auscultation bilaterally, normal work of breathing GI: soft, nontender, nondistended, + BS MS: no  deformity or atrophy  Skin: warm and dry, no rash Neuro:  Strength and sensation are intact Psych: euthymic mood, full affect   EKG:   The ekg ordered today demonstrates NSR, no ST changes   Recent Labs: 04/26/2019: ALT 18; BUN 7; Creatinine, Ser 1.00; Hemoglobin 16.2; Platelets 284; Potassium 4.3; Sodium 131   Lipid Panel    Component Value  Date/Time   CHOL 116 06/24/2015 0210   TRIG 75 06/24/2015 0210   HDL 40 (L) 06/24/2015 0210   CHOLHDL 2.9 06/24/2015 0210   VLDL 15 06/24/2015 0210   LDLCALC 61 06/24/2015 0210     Other studies Reviewed: Additional studies/ records that were reviewed today with results demonstrating: .   ASSESSMENT AND PLAN:  1. CAD/Old MI: Anginal sx controlled.  Continue aggressive secondary prevention. We discussed CTO PCI of RCA if angina increases.  Not indicated at this time.  2. DM: A1C 14.5 in 2/21.  Diagnosed when he had gained a lot of weight.  Working on getting this down.  High fiber diet.  3. HTN: Checked at home.  133/85 typically at home.  4. Quit tobacco use in 2017.  5. Hyperlipidemia:  He had not been taking his atorvastatin as prescribed.  Start rosuvastatin 20 mg daily.  Check liver and lipids in 2-3 months.  If he has more aches, could try CoQ10.     Current medicines are reviewed at length with the patient today.  The patient concerns regarding his medicines were addressed.  The following changes have been made:  No change  Labs/ tests ordered today include:  No orders of the defined types were placed in this encounter.   Recommend 150 minutes/week of aerobic exercise Low fat, low carb, high fiber diet recommended  Disposition:   FU in 1 year   Signed, Lance Muss, Parker  07/23/2019 11:23 AM    [facility] [address] [place], [place], [place]  [address] Phone: [phone]; Fax: [phone]

## 2019-10-17 ENCOUNTER — Telehealth: Payer: Self-pay | Admitting: Interventional Cardiology

## 2019-10-17 NOTE — Telephone Encounter (Signed)
Patient called stating he does not have insurance and did not want to schedule any further appointments with the office.

## 2019-10-22 ENCOUNTER — Other Ambulatory Visit: Payer: BLUE CROSS/BLUE SHIELD

## 2020-05-28 IMAGING — DX DG CHEST 1V PORT
1 series · 1 of 1 positions shown · non-contrast
Comparison: 11/27/2017

CLINICAL DATA: Shortness of breath, COVID positive

EXAM:
PORTABLE CHEST 1 VIEW

[chest ap]
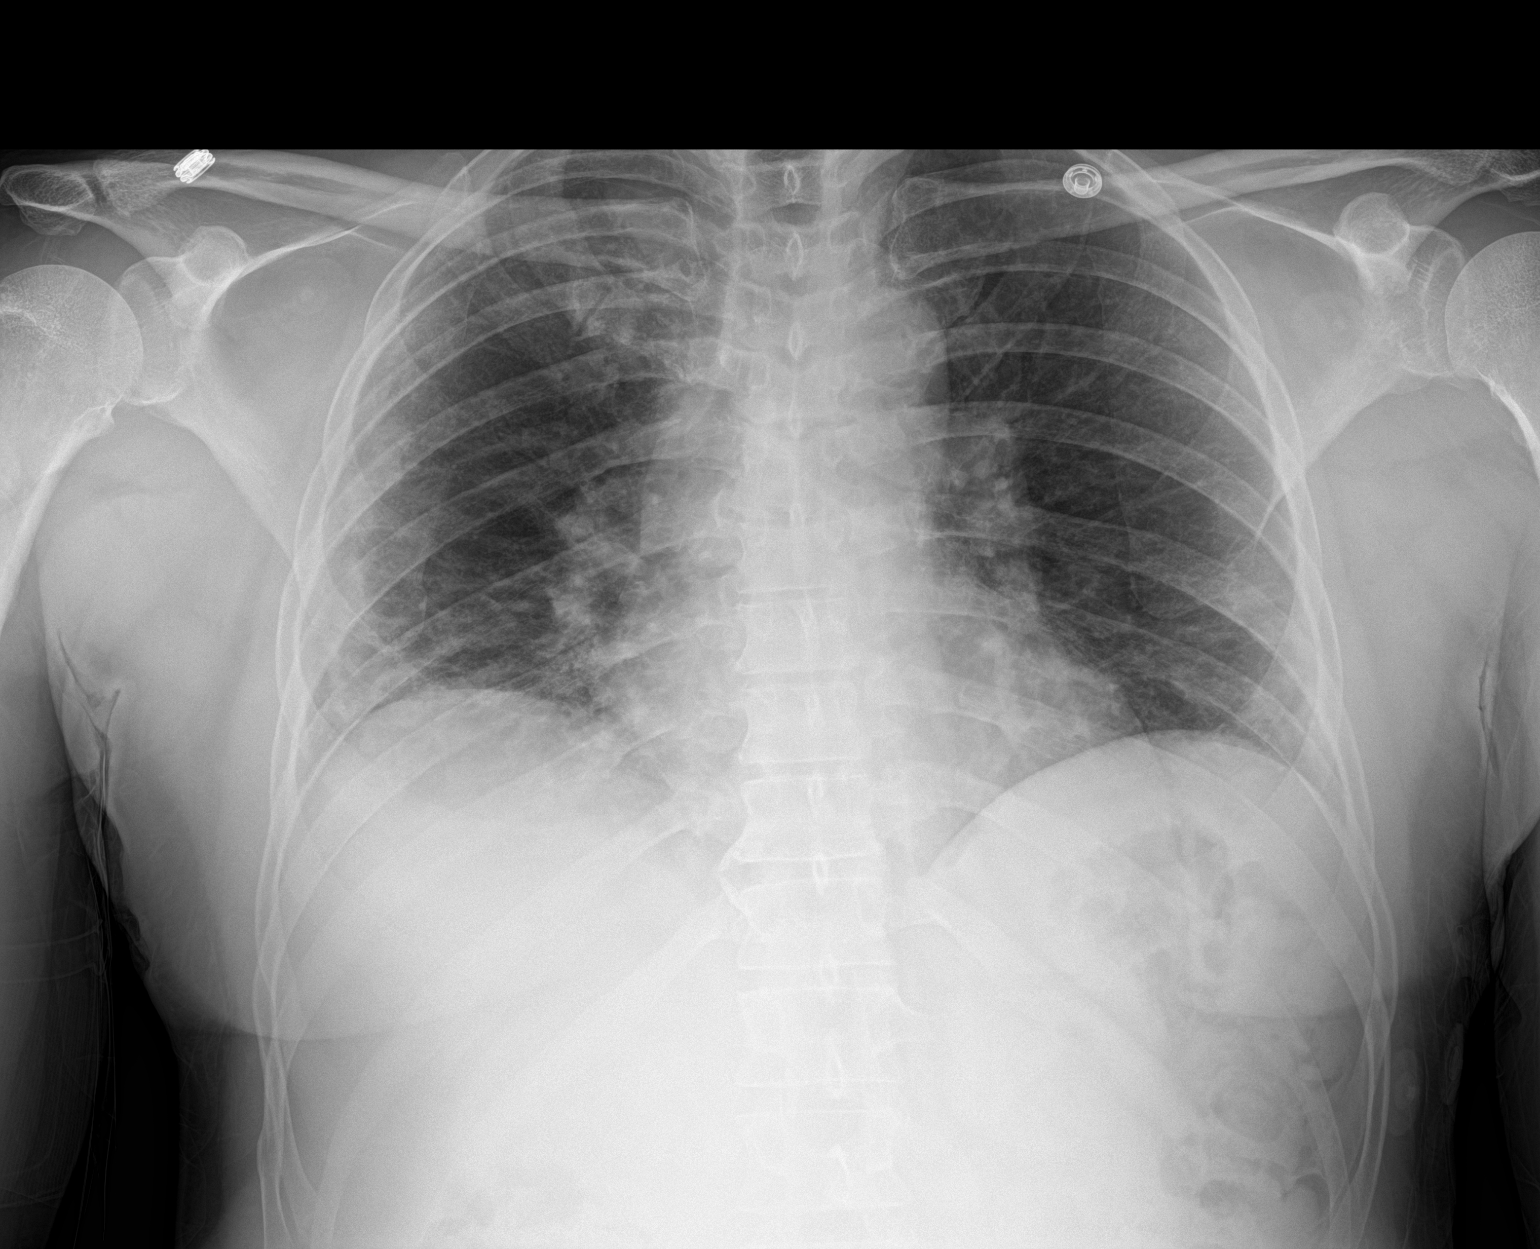

[1 of 1 positions shown; findings below may reference images not displayed]

FINDINGS: Right greater than left peripheral lung opacities. Normal heart
size. No pneumothorax or pleural effusion
IMPRESSION: Right greater than left peripheral lung opacities consistent with
pneumonia and history of COVID positivity.

## 2020-07-17 ENCOUNTER — Emergency Department (HOSPITAL_COMMUNITY)
Admission: EM | Admit: 2020-07-17 | Discharge: 2020-07-17 | Disposition: A | Discharge status: Home / Self Care | Payer: Self-pay | Attending: Emergency Medicine | Admitting: Emergency Medicine

## 2020-07-17 ENCOUNTER — Other Ambulatory Visit: Payer: Self-pay

## 2020-07-17 DIAGNOSIS — E119 Type 2 diabetes mellitus without complications: Secondary | ICD-10-CM | POA: Insufficient documentation

## 2020-07-17 DIAGNOSIS — I2510 Atherosclerotic heart disease of native coronary artery without angina pectoris: Secondary | ICD-10-CM | POA: Insufficient documentation

## 2020-07-17 DIAGNOSIS — Z79899 Other long term (current) drug therapy: Secondary | ICD-10-CM | POA: Insufficient documentation

## 2020-07-17 DIAGNOSIS — Z7982 Long term (current) use of aspirin: Secondary | ICD-10-CM | POA: Insufficient documentation

## 2020-07-17 DIAGNOSIS — Z87891 Personal history of nicotine dependence: Secondary | ICD-10-CM | POA: Insufficient documentation

## 2020-07-17 DIAGNOSIS — I509 Heart failure, unspecified: Secondary | ICD-10-CM | POA: Insufficient documentation

## 2020-07-17 DIAGNOSIS — Z7984 Long term (current) use of oral hypoglycemic drugs: Secondary | ICD-10-CM | POA: Insufficient documentation

## 2020-07-17 DIAGNOSIS — K047 Periapical abscess without sinus: Secondary | ICD-10-CM | POA: Insufficient documentation

## 2020-07-17 DIAGNOSIS — I110 Hypertensive heart disease with heart failure: Secondary | ICD-10-CM | POA: Insufficient documentation

## 2020-07-17 MED ORDER — CHLORHEXIDINE GLUCONATE 0.12 % MT SOLN: 15.0000 mL | Freq: Two times a day (BID) | OROMUCOSAL | 0 refills | Status: DC

## 2020-07-17 MED ORDER — PENICILLIN V POTASSIUM 500 MG PO TABS: 500.0000 mg | ORAL_TABLET | Freq: Four times a day (QID) | ORAL | 0 refills | Status: AC

## 2020-07-17 NOTE — ED Notes (Signed)
Pt states in a hurry, unable to wait for discharge vitals. Patient Alert and oriented to baseline. Stable and ambulatory to baseline. Patient verbalized understanding of the discharge instructions.  Patient belongings were taken by the patient.

## 2020-07-17 NOTE — ED Provider Notes (Signed)
[facility] EMERGENCY DEPARTMENT Provider Note   CSN: [id number] Arrival date & time: 07/17/20  1348     History Chief Complaint  Patient presents with  . Dental Pain    Jimmy Parker is a 50 y.o. male.  HPI 49 year old male with a history of CAD, CHF, DM type II, hyperlipidemia, hypertension, presents to the ER with complaints of left-sided dental pain x3 days.  States he has been having pain with brushing his teeth, chewing.  Denies any drooling, no fevers or chills.  He has a history of poor dentition.  Does not have a Education officer, community or dental insurance.  Denies any trismus, muffled voice change, swallowing.  Did notice a bump to his gums, and noticed some discharge coming from it after brushing his teeth this morning.  He has been using hydrogen peroxide with little relief.    Past Medical History:  Diagnosis Date  . CAD (coronary artery disease)    a. 07/2013 Neg MV;  b. 06/2015 NSTEMI/Cath:LM nl, LAD 72m,  D1/2/3 nl, RI mod/nl, LCX 19m, OM1 small, OM2 nl, RCA 20p, 163m, RPDA fills via collats from OM3, EF 35-45%.  Marland Kitchen CAD- occluded RCA with collaterals 06/24/2015  . Chest pain 08/03/2013  . CHF (congestive heart failure) (HCC)   . Diabetes mellitus (HCC) 05/28/2008   Qualifier: Diagnosis of  By: Levon Hedger    . Diet-controlled diabetes mellitus (HCC)   . Dyslipidemia 06/23/2015  . DYSMETABOLIC SYNDROME 08/13/2008   Qualifier: Diagnosis of  By: Daphine Deutscher FNP, Zena Amos    . Heart attack (HCC)   . Hyperlipidemia   . Hypertension   . Hypertensive heart disease   . Ischemic cardiomyopathy    a. 07/2013 Echo: EF 50-55%, no rwma, Gr1 DD;  b. 06/2015 EF 35-45% by Vgram.   . NSTEMI (non-ST elevated myocardial infarction) (HCC) 06/23/2015  . Tobacco abuse    a. quit 06/2015.  Marland Kitchen TOBACCO ABUSE 08/05/2009   Qualifier: Diagnosis of  By: Levon Hedger      Patient Active Problem List   Diagnosis Date Noted  . CAD (coronary artery disease)   . Ischemic cardiomyopathy   .  Diet-controlled diabetes mellitus (HCC)   . Hypertensive heart disease   . Tobacco abuse   . CAD- occluded RCA with collaterals 06/24/2015  . NSTEMI (non-ST elevated myocardial infarction) (HCC) 06/23/2015  . Dyslipidemia 06/23/2015  . Chest pain 08/03/2013  . TOBACCO ABUSE 08/05/2009  . DYSMETABOLIC SYNDROME 08/13/2008  . Diabetes mellitus (HCC) 05/28/2008  . HYPERTENSION, BENIGN ESSENTIAL 06/08/2007    Past Surgical History:  Procedure Laterality Date  . CARDIAC CATHETERIZATION N/A 06/23/2015   Procedure: Left Heart Cath and Coronary Angiography;  Surgeon: Kathleene Hazel, MD;  Location: [facility] INVASIVE CV LAB;  Service: Cardiovascular;  Laterality: N/A;  . FRACTURE SURGERY     femur and hip  . SHOULDER SURGERY     Right: Patient denies any hx of shoulder surgery       Family History  Problem Relation Age of Onset  . Heart attack Maternal Grandfather   . Hypertension Maternal Grandfather   . Heart attack Paternal Grandmother     Social History   Tobacco Use  . Smoking status: Former Smoker    Start date: 06/27/2015  . Smokeless tobacco: Never Used  Substance Use Topics  . Alcohol use: No    Alcohol/week: 0.0 standard drinks  . Drug use: No    Home Medications Prior to Admission medications   Medication Sig  Start Date End Date Taking? Authorizing Provider  acetaminophen (TYLENOL) 160 MG/5ML liquid Take 500 mg by mouth 2 (two) times daily.    [provider]  amLODipine (NORVASC) 10 MG tablet Take 1 tablet (10 mg total) by mouth daily. 06/24/15   Abelino Derrick, PA-C  aspirin EC 81 MG EC tablet Take 1 tablet (81 mg total) by mouth daily. 06/24/15   Abelino Derrick, PA-C  cholecalciferol (VITAMIN D) 1000 units tablet Take 4,000 Units by mouth daily.    [provider]  diphenhydrAMINE (BENADRYL) 25 MG tablet Take 1 tablet (25 mg total) by mouth every 6 (six) hours as needed. Patient taking differently: Take 25 mg by mouth every 6 (six) hours as  needed for allergies.  10/21/17   Raeford Razor, MD  glipiZIDE (GLUCOTROL) 5 MG tablet Take 5 mg by mouth 2 (two) times daily.    [provider]  hydrocortisone 2.5 % lotion Apply topically 2 (two) times daily. 10/21/17   Raeford Razor, MD  isosorbide mononitrate (IMDUR) 30 MG 24 hr tablet Take 0.5 tablets (15 mg total) by mouth daily. 07/04/15   Creig Hines, NP  JANUMET 50-1000 MG tablet Take 1 tablet by mouth 2 (two) times daily. 02/09/19   [provider]  lisinopril (PRINIVIL,ZESTRIL) 20 MG tablet Take 10 mg by mouth daily. Does not know the dose     [provider]  metFORMIN (GLUCOPHAGE) 1000 MG tablet Take 0.5 tablets (500 mg total) by mouth 2 (two) times daily. Patient taking differently: Take 1,000 mg by mouth 2 (two) times daily.  09/16/16   Corena Herter, MD  methocarbamol (ROBAXIN) 500 MG tablet Take 1 tablet (500 mg total) by mouth 2 (two) times daily. 03/07/18   Muthersbaugh, Dahlia Client, PA-C  metoprolol tartrate (LOPRESSOR) 25 MG tablet Take 12.5 mg by mouth 2 (two) times daily.    [provider]  naproxen (NAPROSYN) 500 MG tablet Take 1 tablet (500 mg total) by mouth 2 (two) times daily with a meal. 03/07/18   Muthersbaugh, Dahlia Client, PA-C  nitroGLYCERIN (NITROSTAT) 0.4 MG SL tablet Place 1 tablet (0.4 mg total) under the tongue every 5 (five) minutes x 3 doses as needed for chest pain. 06/24/15   Abelino Derrick, PA-C  rosuvastatin (CRESTOR) 20 MG tablet Take 1 tablet (20 mg total) by mouth daily. 07/23/19   Corky Crafts, MD  sertraline (ZOLOFT) 100 MG tablet Take 100 mg by mouth daily.    [provider]    Allergies    Patient has no known allergies.  Review of Systems   Review of Systems  Constitutional: Negative for fever.  HENT: Positive for dental problem. Negative for drooling, sore throat, trouble swallowing and voice change.     Physical Exam Updated Vital Signs BP (!) 159/104 (BP Location: Right Arm)    Pulse (!) 115   Temp 99 F (37.2 C) (Oral)   Resp 18   SpO2 96%   Physical Exam Vitals and nursing note reviewed.  Constitutional:      Appearance: He is well-developed.  HENT:     Head: Normocephalic and atraumatic.     Mouth/Throat:     Mouth: Mucous membranes are moist.     Pharynx: Oropharynx is clear.      Comments: Poor dentition, small superficial gum abscess with active drainage to the left upper outer jaw. Oropharynx non erythematous without exudates, uvula midline, no unilateral tonsillar swelling, tongue normal size and midline, no sublingual/submandibular swellimg,  tolerating secretions well, no trismus    Eyes:     Conjunctiva/sclera: Conjunctivae normal.  Cardiovascular:     Rate and Rhythm: Normal rate and regular rhythm.     Heart sounds: No murmur heard.   Pulmonary:     Effort: Pulmonary effort is normal. No respiratory distress.     Breath sounds: Normal breath sounds.  Abdominal:     Palpations: Abdomen is soft.     Tenderness: There is no abdominal tenderness.  Musculoskeletal:     Cervical back: Neck supple.  Skin:    General: Skin is warm and dry.  Neurological:     General: No focal deficit present.     Mental Status: He is alert and oriented to person, place, and time.     ED Results / Procedures / Treatments   Labs (all labs ordered are listed, but only abnormal results are displayed) Labs Reviewed - No data to display  EKG None  Radiology No results found.  Procedures Procedures   Medications Ordered in ED Medications - No data to display  ED Course  I have reviewed the triage vital signs and the nursing notes.  Pertinent labs & imaging results that were available during my care of the patient were reviewed by me and considered in my medical decision making (see chart for details).    MDM Rules/Calculators/A&P                          Patient with toothache and a small draining abscess to the left upper out gum above tooth  13. Offered further I&D but patient would prefer managing with antibiotics given already evidence of active drainage which I think is reasonable.  Exam unconcerning for Ludwig's angina or spread of infection.  Will treat with penicillin and anti-inflammatories medicine.  Urged patient to follow-up with dentist.  Return precautions discussed.  We will also provide chlorhexidine mouthwash.  All the patient's questions have been answered to satisfaction, he was understanding and is agreeable.  Stable for discharge  Final Clinical Impression(s) / ED Diagnoses Final diagnoses:  Dental abscess    Rx / DC Orders ED Discharge Orders    None       Mare Ferrari, PA-C 07/17/20 1534    Margarita Grizzle, MD 07/19/20 0800

## 2020-07-17 NOTE — Discharge Instructions (Addendum)
Please make sure to take the antibiotic exactly as directed until finished.  You may also take Tylenol for pain, and use the Peridex mouthwash as well.  It is very important that you follow-up with a dentist, provided a list of dentists in the area.  Please return to the ER for any new or worsening symptoms.

## 2020-07-17 NOTE — ED Triage Notes (Signed)
Pt reports L sided dental pain x 2 days.

## 2021-04-21 ENCOUNTER — Encounter (HOSPITAL_COMMUNITY): Payer: Self-pay | Admitting: Emergency Medicine

## 2021-04-21 ENCOUNTER — Emergency Department (HOSPITAL_COMMUNITY): Payer: Self-pay

## 2021-04-21 ENCOUNTER — Emergency Department (HOSPITAL_COMMUNITY)
Admission: EM | Admit: 2021-04-21 | Discharge: 2021-04-21 | Discharge status: Against Medical Advice | Payer: Self-pay | Attending: Physician Assistant | Admitting: Physician Assistant

## 2021-04-21 ENCOUNTER — Other Ambulatory Visit: Payer: Self-pay

## 2021-04-21 DIAGNOSIS — Z5321 Procedure and treatment not carried out due to patient leaving prior to being seen by health care provider: Secondary | ICD-10-CM | POA: Insufficient documentation

## 2021-04-21 DIAGNOSIS — R079 Chest pain, unspecified: Secondary | ICD-10-CM | POA: Insufficient documentation

## 2021-04-21 LAB — TROPONIN I (HIGH SENSITIVITY)
Troponin I (High Sensitivity): 12 ng/L (ref <18)
Troponin I (High Sensitivity): 9 ng/L (ref <18)

## 2021-04-21 LAB — BASIC METABOLIC PANEL
Anion gap: 12 (ref 5–15)
BUN: 7 mg/dL (ref 6–20)
CO2: 22 mmol/L (ref 22–32)
Calcium: 9.6 mg/dL (ref 8.9–10.3)
Chloride: 94 mmol/L — ABNORMAL LOW (ref 98–111)
Creatinine, Ser: 0.85 mg/dL (ref 0.61–1.24)
GFR, Estimated: >60 mL/min (ref >60)
Glucose, Bld: 460 mg/dL — ABNORMAL HIGH (ref 70–99)
Potassium: 4.2 mmol/L (ref 3.5–5.1)
Sodium: 128 mmol/L — ABNORMAL LOW (ref 135–145)

## 2021-04-21 LAB — CBC
HCT: 51.1 % (ref 39.0–52.0)
Hemoglobin: 16.5 g/dL (ref 13.0–17.0)
MCH: 27.9 pg (ref 26.0–34.0)
MCHC: 32.3 g/dL (ref 30.0–36.0)
MCV: 86.3 fL (ref 80.0–100.0)
Platelets: 259 10*3/uL (ref 150–400)
RBC: 5.92 MIL/uL — ABNORMAL HIGH (ref 4.22–5.81)
RDW: 11.8 % (ref 11.5–15.5)
WBC: 7.7 10*3/uL (ref 4.0–10.5)
nRBC: 0 % (ref 0.0–0.2)

## 2021-04-21 NOTE — ED Notes (Signed)
Pt stated they were leaving  

## 2021-04-21 NOTE — ED Notes (Signed)
Went to the car

## 2021-04-21 NOTE — ED Provider Triage Note (Signed)
Emergency Medicine Provider Triage Evaluation Note  Jimmy Parker , a 51 y.o. male  was evaluated in triage.  Pt complains of chest pain that radiates to the LUE. Pain started yesterday and has been intermittent. No specific aggravating/alleviating factors. Also has sob.  Review of Systems  Positive: Chest pain, sob, cough Negative: Sweats, chills  Physical Exam  BP (!) 148/85 (BP Location: Right Arm)    Pulse 100    Temp 98.8 F (37.1 C) (Oral)    Resp 16    SpO2 100%  Gen:   Awake, no distress   Resp:  Normal effort  MSK:   Moves extremities without difficulty  Other:  Heart with mild tachycardia, lungs ctab  Medical Decision Making  Medically screening exam initiated at 3:22 PM.  Appropriate orders placed.  CHRISTOPHOR AMADO was informed that the remainder of the evaluation will be completed by another provider, this initial triage assessment does not replace that evaluation, and the importance of remaining in the ED until their evaluation is complete.     Rodney Booze, [place] 04/21/21 [phone]

## 2021-04-21 NOTE — ED Triage Notes (Signed)
Patient here for evaluation of chest pain and left arm numbness that started yesterday. Patient also reports indigestion that started today. Patient reports feeling weak yesterday and but not today, denies nausea and vomiting. Patient alert, oriented, ambulatory, speaking in complete sentences, and in no apparent distress at this time.

## 2021-04-22 ENCOUNTER — Encounter (HOSPITAL_BASED_OUTPATIENT_CLINIC_OR_DEPARTMENT_OTHER): Payer: Self-pay | Admitting: Emergency Medicine

## 2021-04-22 ENCOUNTER — Emergency Department (HOSPITAL_BASED_OUTPATIENT_CLINIC_OR_DEPARTMENT_OTHER)
Admission: EM | Admit: 2021-04-22 | Discharge: 2021-04-22 | Disposition: A | Discharge status: Home / Self Care | Payer: Self-pay | Attending: Emergency Medicine | Admitting: Emergency Medicine

## 2021-04-22 ENCOUNTER — Other Ambulatory Visit: Payer: Self-pay

## 2021-04-22 DIAGNOSIS — Z79899 Other long term (current) drug therapy: Secondary | ICD-10-CM | POA: Insufficient documentation

## 2021-04-22 DIAGNOSIS — R5383 Other fatigue: Secondary | ICD-10-CM | POA: Insufficient documentation

## 2021-04-22 DIAGNOSIS — R202 Paresthesia of skin: Secondary | ICD-10-CM | POA: Insufficient documentation

## 2021-04-22 DIAGNOSIS — R739 Hyperglycemia, unspecified: Secondary | ICD-10-CM

## 2021-04-22 DIAGNOSIS — E119 Type 2 diabetes mellitus without complications: Secondary | ICD-10-CM | POA: Insufficient documentation

## 2021-04-22 DIAGNOSIS — Z7984 Long term (current) use of oral hypoglycemic drugs: Secondary | ICD-10-CM | POA: Insufficient documentation

## 2021-04-22 DIAGNOSIS — I10 Essential (primary) hypertension: Secondary | ICD-10-CM | POA: Insufficient documentation

## 2021-04-22 DIAGNOSIS — Z7982 Long term (current) use of aspirin: Secondary | ICD-10-CM | POA: Insufficient documentation

## 2021-04-22 LAB — COMPREHENSIVE METABOLIC PANEL
ALT: 9 U/L (ref 0–44)
AST: 10 U/L — ABNORMAL LOW (ref 15–41)
Albumin: 4.5 g/dL (ref 3.5–5.0)
Alkaline Phosphatase: 75 U/L (ref 38–126)
Anion gap: 9 (ref 5–15)
BUN: 8 mg/dL (ref 6–20)
CO2: 27 mmol/L (ref 22–32)
Calcium: 9.7 mg/dL (ref 8.9–10.3)
Chloride: 96 mmol/L — ABNORMAL LOW (ref 98–111)
Creatinine, Ser: 0.96 mg/dL (ref 0.61–1.24)
GFR, Estimated: >60 mL/min (ref >60)
Glucose, Bld: 406 mg/dL — ABNORMAL HIGH (ref 70–99)
Potassium: 4.2 mmol/L (ref 3.5–5.1)
Sodium: 132 mmol/L — ABNORMAL LOW (ref 135–145)
Total Bilirubin: 0.7 mg/dL (ref 0.3–1.2)
Total Protein: 7.3 g/dL (ref 6.5–8.1)

## 2021-04-22 LAB — URINALYSIS, ROUTINE W REFLEX MICROSCOPIC
Bilirubin Urine: NEGATIVE
Glucose, UA: >1000 mg/dL — AB
Hgb urine dipstick: NEGATIVE
Ketones, ur: NEGATIVE mg/dL
Leukocytes,Ua: NEGATIVE
Nitrite: NEGATIVE
Protein, ur: NEGATIVE mg/dL
Specific Gravity, Urine: 1.041 — ABNORMAL HIGH (ref 1.005–1.030)
pH: 5.5 (ref 5.0–8.0)

## 2021-04-22 LAB — I-STAT VENOUS BLOOD GAS, ED
Acid-Base Excess: 3 mmol/L — ABNORMAL HIGH (ref 0.0–2.0)
Bicarbonate: 29.3 mmol/L — ABNORMAL HIGH (ref 20.0–28.0)
Calcium, Ion: 1.29 mmol/L (ref 1.15–1.40)
HCT: 56 % — ABNORMAL HIGH (ref 39.0–52.0)
Hemoglobin: 19 g/dL — ABNORMAL HIGH (ref 13.0–17.0)
O2 Saturation: 49 %
Patient temperature: 98.6
Potassium: 4.2 mmol/L (ref 3.5–5.1)
Sodium: 130 mmol/L — ABNORMAL LOW (ref 135–145)
TCO2: 31 mmol/L (ref 22–32)
pCO2, Ven: 50 mmHg (ref 44.0–60.0)
pH, Ven: 7.376 (ref 7.250–7.430)
pO2, Ven: 28 mmHg — CL (ref 32.0–45.0)

## 2021-04-22 LAB — CBC WITH DIFFERENTIAL/PLATELET
Abs Immature Granulocytes: 0.03 10*3/uL (ref 0.00–0.07)
Basophils Absolute: 0 10*3/uL (ref 0.0–0.1)
Basophils Relative: 0 %
Eosinophils Absolute: 0.2 10*3/uL (ref 0.0–0.5)
Eosinophils Relative: 3 %
HCT: 53.1 % — ABNORMAL HIGH (ref 39.0–52.0)
Hemoglobin: 17.3 g/dL — ABNORMAL HIGH (ref 13.0–17.0)
Immature Granulocytes: 0 %
Lymphocytes Relative: 34 %
Lymphs Abs: 2.6 10*3/uL (ref 0.7–4.0)
MCH: 27.8 pg (ref 26.0–34.0)
MCHC: 32.6 g/dL (ref 30.0–36.0)
MCV: 85.4 fL (ref 80.0–100.0)
Monocytes Absolute: 0.4 10*3/uL (ref 0.1–1.0)
Monocytes Relative: 5 %
Neutro Abs: 4.4 10*3/uL (ref 1.7–7.7)
Neutrophils Relative %: 58 %
Platelets: 261 10*3/uL (ref 150–400)
RBC: 6.22 MIL/uL — ABNORMAL HIGH (ref 4.22–5.81)
RDW: 11.9 % (ref 11.5–15.5)
WBC: 7.7 10*3/uL (ref 4.0–10.5)
nRBC: 0 % (ref 0.0–0.2)

## 2021-04-22 LAB — CBG MONITORING, ED
Glucose-Capillary: 494 mg/dL — ABNORMAL HIGH (ref 70–99)
Glucose-Capillary: 276 mg/dL — ABNORMAL HIGH (ref 70–99)

## 2021-04-22 LAB — TROPONIN I (HIGH SENSITIVITY): Troponin I (High Sensitivity): 8 ng/L (ref <18)

## 2021-04-22 MED ORDER — GLIPIZIDE 5 MG PO TABS: 5.0000 mg | ORAL_TABLET | Freq: Two times a day (BID) | ORAL | 0 refills | Status: DC

## 2021-04-22 MED ORDER — METFORMIN HCL 1000 MG PO TABS: 500.0000 mg | ORAL_TABLET | Freq: Two times a day (BID) | ORAL | 0 refills | Status: DC

## 2021-04-22 MED ORDER — SODIUM CHLORIDE 0.9 % IV BOLUS
1000.0000 mL | Freq: Once | INTRAVENOUS | Status: AC
  Administered 2021-04-22: 1000 mL via INTRAVENOUS

## 2021-04-22 MED ORDER — METFORMIN HCL 500 MG PO TABS
500.0000 mg | ORAL_TABLET | Freq: Once | ORAL | Status: AC
  Administered 2021-04-22: 500 mg via ORAL
  Filled 2021-04-22: qty 1

## 2021-04-22 MED ORDER — LISINOPRIL 20 MG PO TABS: 10.0000 mg | ORAL_TABLET | Freq: Every day | ORAL | 0 refills | Status: DC

## 2021-04-22 MED ORDER — AMLODIPINE BESYLATE 10 MG PO TABS: 10.0000 mg | ORAL_TABLET | Freq: Every day | ORAL | 1 refills | Status: DC

## 2021-04-22 NOTE — ED Triage Notes (Addendum)
States was at [place] last night and left without being seen and saw today on my chart that his sugar was high  , has not taken his meds today because he has been out x 6 months has no PCP

## 2021-04-22 NOTE — ED Notes (Addendum)
Pt states he has been out of meds 6 months to a year due to no insurance

## 2021-04-22 NOTE — ED Provider Notes (Signed)
Patient signed to me by Dr. Wilkie Aye.  Blood sugar is much improved after IV fluids.  No evidence of DKA at this time.  Patient feels better and will be discharged with referral to community wellness center as well as prescriptions for his diabetic medication   Lorre Nick, MD 04/22/21 1728

## 2021-04-22 NOTE — ED Provider Notes (Signed)
[facility] EMERGENCY DEPT Provider Note   CSN: [id number] Arrival date & time: 04/22/21  1307     History  Chief Complaint  Patient presents with   Hyperglycemia    ESEQUIEL KLEINFELTER is a 51 y.o. male.  HPI  51 year old male with past medical history of DM, HTN presents to the emergency department with concern for chest pain and hyperglycemia.  Patient admits to being noncompliant with his home medications for at least the past 9 months.  He went to [facility] yesterday after having a day of intermittent sharp chest pain.  He was waiting in the waiting room and left after a long wait time.  He was not seen by provider.  Patient checked my chart and noticed that his blood sugar was high so he came in for reevaluation.  He admits to fatigue, feeling thirsty.  Denies any acute fever, nausea/vomiting/diarrhea.  Patient states that her chest pain has resolved.  No associated shortness of breath or for swelling of his lower extremities.  Patient has been having numbness and tingling of the bilateral hands.  Mainly the fingers on the right and hand on the left, improving.  No associated facial droop, speech change, focal weakness.  Home Medications Prior to Admission medications   Medication Sig Start Date End Date Taking? Authorizing Provider  acetaminophen (TYLENOL) 160 MG/5ML liquid Take 500 mg by mouth 2 (two) times daily.    [provider]  amLODipine (NORVASC) 10 MG tablet Take 1 tablet (10 mg total) by mouth daily. 06/24/15   Abelino Derrick, PA-C  aspirin EC 81 MG EC tablet Take 1 tablet (81 mg total) by mouth daily. 06/24/15   Abelino Derrick, PA-C  chlorhexidine (PERIDEX) 0.12 % solution Use as directed 15 mLs in the mouth or throat 2 (two) times daily. 07/17/20   Mare Ferrari, PA-C  cholecalciferol (VITAMIN D) 1000 units tablet Take 4,000 Units by mouth daily.    [provider]  diphenhydrAMINE (BENADRYL) 25 MG tablet Take 1 tablet (25 mg total) by mouth  every 6 (six) hours as needed. Patient taking differently: Take 25 mg by mouth every 6 (six) hours as needed for allergies.  10/21/17   Raeford Razor, MD  glipiZIDE (GLUCOTROL) 5 MG tablet Take 5 mg by mouth 2 (two) times daily.    [provider]  hydrocortisone 2.5 % lotion Apply topically 2 (two) times daily. 10/21/17   Raeford Razor, MD  isosorbide mononitrate (IMDUR) 30 MG 24 hr tablet Take 0.5 tablets (15 mg total) by mouth daily. 07/04/15   Creig Hines, NP  JANUMET 50-1000 MG tablet Take 1 tablet by mouth 2 (two) times daily. 02/09/19   [provider]  lisinopril (PRINIVIL,ZESTRIL) 20 MG tablet Take 10 mg by mouth daily. Does not know the dose     [provider]  metFORMIN (GLUCOPHAGE) 1000 MG tablet Take 0.5 tablets (500 mg total) by mouth 2 (two) times daily. Patient taking differently: Take 1,000 mg by mouth 2 (two) times daily.  09/16/16   Corena Herter, MD  methocarbamol (ROBAXIN) 500 MG tablet Take 1 tablet (500 mg total) by mouth 2 (two) times daily. 03/07/18   Muthersbaugh, Dahlia Client, PA-C  metoprolol tartrate (LOPRESSOR) 25 MG tablet Take 12.5 mg by mouth 2 (two) times daily.    [provider]  naproxen (NAPROSYN) 500 MG tablet Take 1 tablet (500 mg total) by mouth 2 (two) times daily with a meal. 03/07/18   Muthersbaugh, Dahlia Client,  PA-C  nitroGLYCERIN (NITROSTAT) 0.4 MG SL tablet Place 1 tablet (0.4 mg total) under the tongue every 5 (five) minutes x 3 doses as needed for chest pain. 06/24/15   Abelino DerrickKilroy, Luke K, PA-C  rosuvastatin (CRESTOR) 20 MG tablet Take 1 tablet (20 mg total) by mouth daily. 07/23/19   Corky CraftsVaranasi, Jayadeep S, MD  sertraline (ZOLOFT) 100 MG tablet Take 100 mg by mouth daily.    [provider]      Allergies    Patient has no known allergies.    Review of Systems   Review of Systems  Constitutional:  Positive for fatigue. Negative for fever.  Respiratory:  Negative for shortness of breath.   Cardiovascular:   Positive for chest pain. Negative for palpitations and leg swelling.  Gastrointestinal:  Negative for abdominal pain, diarrhea and vomiting.  Skin:  Negative for rash.  Neurological:  Negative for headaches.   Physical Exam Updated Vital Signs BP (!) 181/85    Pulse (!) 109    Temp 98.6 F (37 C) (Oral)    Resp 16    Ht 5\' 6"  (1.676 m)    Wt 74.8 kg    SpO2 99%    BMI 26.62 kg/m  Physical Exam Vitals and nursing note reviewed.  Constitutional:      General: He is not in acute distress.    Appearance: Normal appearance.  HENT:     Head: Normocephalic.     Mouth/Throat:     Mouth: Mucous membranes are moist.  Cardiovascular:     Rate and Rhythm: Normal rate.  Pulmonary:     Effort: Pulmonary effort is normal. No respiratory distress.  Abdominal:     Palpations: Abdomen is soft.     Tenderness: There is no abdominal tenderness.  Skin:    General: Skin is warm.  Neurological:     Mental Status: He is alert and oriented to person, place, and time. Mental status is at baseline.  Psychiatric:        Mood and Affect: Mood normal.    ED Results / Procedures / Treatments   Labs (all labs ordered are listed, but only abnormal results are displayed) Labs Reviewed  CBG MONITORING, ED - Abnormal; Notable for the following components:      Result Value   Glucose-Capillary 494 (*)    All other components within normal limits  CBC WITH DIFFERENTIAL/PLATELET  COMPREHENSIVE METABOLIC PANEL  URINALYSIS, ROUTINE W REFLEX MICROSCOPIC  I-STAT VENOUS BLOOD GAS, ED    EKG None  Radiology DG Chest 1 View  Result Date: 04/21/2021 CLINICAL DATA:  Chest pain for several days. EXAM: CHEST  1 VIEW COMPARISON:  Chest radiograph dated April 26, 2019 FINDINGS: The heart size and mediastinal contours are within normal limits. Both lungs are clear. The visualized skeletal structures are unremarkable. IMPRESSION: No active disease. Electronically Signed   By: Larose HiresImran  Ahmed D.O.   On: 04/21/2021  15:55    Procedures Procedures    Medications Ordered in ED Medications  sodium chloride 0.9 % bolus 1,000 mL (has no administration in time range)    ED Course/ Medical Decision Making/ A&P                           Medical Decision Making  51 year old male presents emergency department for evaluation of intermittent chest pain that has resolved and concern for hyperglycemia.  Noncompliant with his medications for the past 9 months to a  year.  He is tachycardic on arrival.  Denies any active chest/back pain.  Has been having intermittent tingling in bilateral hands/fingers, worse on the left.  Bilateral nature does not sound strokelike at this time.  Fingerstick shows a blood sugar in the high 400s.  Review of blood work yesterday shows hyponatremia, dehydration, negative troponins.  EKG from yesterday did have T wave changes.  Plan for repeat EKG, blood work here including troponin.  Evaluate for hyperglycemia, DKA/HHS.  Patient signed out to oncoming provider Dr. Freida Busman.        Final Clinical Impression(s) / ED Diagnoses Final diagnoses:  None    Rx / DC Orders ED Discharge Orders     None         Rozelle Logan, DO 04/22/21 1507

## 2021-08-31 ENCOUNTER — Emergency Department (HOSPITAL_BASED_OUTPATIENT_CLINIC_OR_DEPARTMENT_OTHER)
Admission: EM | Admit: 2021-08-31 | Discharge: 2021-08-31 | Disposition: A | Discharge status: Home / Self Care | Payer: Self-pay | Attending: Emergency Medicine | Admitting: Emergency Medicine

## 2021-08-31 ENCOUNTER — Encounter (HOSPITAL_BASED_OUTPATIENT_CLINIC_OR_DEPARTMENT_OTHER): Payer: Self-pay

## 2021-08-31 ENCOUNTER — Other Ambulatory Visit: Payer: Self-pay

## 2021-08-31 DIAGNOSIS — Z79899 Other long term (current) drug therapy: Secondary | ICD-10-CM | POA: Insufficient documentation

## 2021-08-31 DIAGNOSIS — R21 Rash and other nonspecific skin eruption: Secondary | ICD-10-CM | POA: Insufficient documentation

## 2021-08-31 DIAGNOSIS — M25571 Pain in right ankle and joints of right foot: Secondary | ICD-10-CM | POA: Insufficient documentation

## 2021-08-31 DIAGNOSIS — M25572 Pain in left ankle and joints of left foot: Secondary | ICD-10-CM | POA: Insufficient documentation

## 2021-08-31 DIAGNOSIS — E1165 Type 2 diabetes mellitus with hyperglycemia: Secondary | ICD-10-CM | POA: Insufficient documentation

## 2021-08-31 DIAGNOSIS — I2510 Atherosclerotic heart disease of native coronary artery without angina pectoris: Secondary | ICD-10-CM | POA: Insufficient documentation

## 2021-08-31 DIAGNOSIS — M2141 Flat foot [pes planus] (acquired), right foot: Secondary | ICD-10-CM | POA: Insufficient documentation

## 2021-08-31 DIAGNOSIS — M2142 Flat foot [pes planus] (acquired), left foot: Secondary | ICD-10-CM | POA: Insufficient documentation

## 2021-08-31 DIAGNOSIS — I110 Hypertensive heart disease with heart failure: Secondary | ICD-10-CM | POA: Insufficient documentation

## 2021-08-31 DIAGNOSIS — Z7982 Long term (current) use of aspirin: Secondary | ICD-10-CM | POA: Insufficient documentation

## 2021-08-31 DIAGNOSIS — I509 Heart failure, unspecified: Secondary | ICD-10-CM | POA: Insufficient documentation

## 2021-08-31 DIAGNOSIS — M79671 Pain in right foot: Secondary | ICD-10-CM

## 2021-08-31 DIAGNOSIS — Z7984 Long term (current) use of oral hypoglycemic drugs: Secondary | ICD-10-CM | POA: Insufficient documentation

## 2021-08-31 LAB — CBG MONITORING, ED: Glucose-Capillary: 276 mg/dL — ABNORMAL HIGH (ref 70–99)

## 2021-08-31 MED ORDER — GLIPIZIDE 5 MG PO TABS: 5.0000 mg | ORAL_TABLET | Freq: Two times a day (BID) | ORAL | 0 refills | Status: DC

## 2021-08-31 MED ORDER — METFORMIN HCL 1000 MG PO TABS: 500.0000 mg | ORAL_TABLET | Freq: Two times a day (BID) | ORAL | 0 refills | Status: DC

## 2021-08-31 MED ORDER — PERMETHRIN 5 % EX CREA: TOPICAL_CREAM | CUTANEOUS | 0 refills | Status: DC

## 2021-08-31 MED ORDER — DIPHENHYDRAMINE HCL 25 MG PO CAPS
25.0000 mg | ORAL_CAPSULE | Freq: Once | ORAL | Status: AC
  Administered 2021-08-31: 25 mg via ORAL
  Filled 2021-08-31: qty 1

## 2021-08-31 NOTE — ED Provider Notes (Signed)
[facility] EMERGENCY DEPT Provider Note   CSN: [id number] Arrival date & time: 08/31/21  0446     History  Chief Complaint  Patient presents with   Foot Pain   Rash    Jimmy Parker is a 51 y.o. male.  HPI     This is a 51 year old male who presents with multiple complaints.  Patient reports he has had a 1 week history of worsening bilateral foot pain.  He has a history of flatfeet but they have never caused him much pain.  He did start a new job and is on his feet slightly more than usual.  No new issues.  No injury.  He has not taken anything for his pain.  Additionally, patient reports rash.  He states the rash is "all over.  He describes it on his trunk, feet, hands.  He is concerned it may be scabies.  He lives by himself.  He states he is already washed all of those.  He reports that it is also itchy.  No new exposures.  Denies new soaps or detergents.  He has tried hydrocortisone with some relief.  He is also noted a different rash over his mid abdomen.  He does wear a belt every day.  Home Medications Prior to Admission medications   Medication Sig Start Date End Date Taking? Authorizing Provider  permethrin (ELIMITE) 5 % cream Apply to affected area once (from neck to soles of feet) 08/31/21  Yes Mirza Fessel, Mayer Masker, MD  acetaminophen (TYLENOL) 160 MG/5ML liquid Take 500 mg by mouth 2 (two) times daily.    [provider]  amLODipine (NORVASC) 10 MG tablet Take 1 tablet (10 mg total) by mouth daily. 04/22/21   Lorre Nick, MD  aspirin EC 81 MG EC tablet Take 1 tablet (81 mg total) by mouth daily. 06/24/15   Abelino Derrick, PA-C  chlorhexidine (PERIDEX) 0.12 % solution Use as directed 15 mLs in the mouth or throat 2 (two) times daily. 07/17/20   Mare Ferrari, PA-C  cholecalciferol (VITAMIN D) 1000 units tablet Take 4,000 Units by mouth daily.    [provider]  diphenhydrAMINE (BENADRYL) 25 MG tablet Take 1 tablet (25 mg total) by mouth every  6 (six) hours as needed. Patient taking differently: Take 25 mg by mouth every 6 (six) hours as needed for allergies.  10/21/17   Raeford Razor, MD  glipiZIDE (GLUCOTROL) 5 MG tablet Take 1 tablet (5 mg total) by mouth 2 (two) times daily. 08/31/21   Aarika Moon, Mayer Masker, MD  hydrocortisone 2.5 % lotion Apply topically 2 (two) times daily. 10/21/17   Raeford Razor, MD  isosorbide mononitrate (IMDUR) 30 MG 24 hr tablet Take 0.5 tablets (15 mg total) by mouth daily. 07/04/15   Creig Hines, NP  JANUMET 50-1000 MG tablet Take 1 tablet by mouth 2 (two) times daily. 02/09/19   [provider]  lisinopril (ZESTRIL) 20 MG tablet Take 0.5 tablets (10 mg total) by mouth daily. Does not know the dose 04/22/21   Lorre Nick, MD  metFORMIN (GLUCOPHAGE) 1000 MG tablet Take 0.5 tablets (500 mg total) by mouth 2 (two) times daily. 08/31/21   Dierdra Salameh, Mayer Masker, MD  methocarbamol (ROBAXIN) 500 MG tablet Take 1 tablet (500 mg total) by mouth 2 (two) times daily. 03/07/18   Muthersbaugh, Dahlia Client, PA-C  metoprolol tartrate (LOPRESSOR) 25 MG tablet Take 12.5 mg by mouth 2 (two) times daily.    [provider]  naproxen (NAPROSYN)  500 MG tablet Take 1 tablet (500 mg total) by mouth 2 (two) times daily with a meal. 03/07/18   Muthersbaugh, Dahlia Client, PA-C  nitroGLYCERIN (NITROSTAT) 0.4 MG SL tablet Place 1 tablet (0.4 mg total) under the tongue every 5 (five) minutes x 3 doses as needed for chest pain. 06/24/15   Abelino Derrick, PA-C  rosuvastatin (CRESTOR) 20 MG tablet Take 1 tablet (20 mg total) by mouth daily. 07/23/19   Corky Crafts, MD  sertraline (ZOLOFT) 100 MG tablet Take 100 mg by mouth daily.    [provider]      Allergies    Patient has no known allergies.    Review of Systems   Review of Systems  Musculoskeletal:        Foot pain  Skin:  Positive for rash.  All other systems reviewed and are negative.  Physical Exam Updated Vital Signs BP (!) 141/96 (BP  Location: Right Arm)   Pulse 92   Temp 98.7 F (37.1 C) (Oral)   Resp 16   Ht 1.676 m ( )   Wt 63.5 kg   SpO2 100%   BMI 22.60 kg/m  Physical Exam Vitals and nursing note reviewed.  Constitutional:      Appearance: He is well-developed. He is not ill-appearing.  HENT:     Head: Normocephalic and atraumatic.     Mouth/Throat:     Mouth: Mucous membranes are moist.  Cardiovascular:     Rate and Rhythm: Normal rate and regular rhythm.  Pulmonary:     Effort: Pulmonary effort is normal. No respiratory distress.  Abdominal:     Palpations: Abdomen is soft.  Musculoskeletal:     Cervical back: Neck supple.     Comments: Flatfeet noted bilaterally, there is tenderness along the plantar fascia, no wounds noted on the bilateral feet, no significant swelling  Lymphadenopathy:     Cervical: No cervical adenopathy.  Skin:    General: Skin is warm and dry.     Comments: No obvious rash or hives, some excoriations between the webbing of the hands and over the dorsum of the feet, there is hyperpigmentation over the anterior aspect of the abdomen just inferior to the umbilicus  Neurological:     Mental Status: He is alert and oriented to person, place, and time.  Psychiatric:        Mood and Affect: Mood normal.    ED Results / Procedures / Treatments   Labs (all labs ordered are listed, but only abnormal results are displayed) Labs Reviewed  CBG MONITORING, ED - Abnormal; Notable for the following components:      Result Value   Glucose-Capillary 276 (*)    All other components within normal limits    EKG None  Radiology No results found.  Procedures Procedures    Medications Ordered in ED Medications - No data to display  ED Course/ Medical Decision Making/ A&P                           Medical Decision Making Risk Prescription drug management.   This patient presents to the ED for concern of rash and foot pain, this involves an extensive number of treatment  options, and is a complaint that carries with it a high risk of complications and morbidity.  I considered the following differential and admission for this acute, potentially life threatening condition.  The differential diagnosis includes pain related to flatfeet, plantar  fasciitis, inflammation, injury, rash could be related to contact dermatitis, allergic reaction, scabies or bedbugs  MDM:    This is a 51 year old male who presents with multiple complaints.  Nontoxic.  Vital signs are reassuring.  I suspect patient's foot pain is related to inflammation.  Could be plantar fasciitis or just inadequate support from his shoes given how flat his feet are.  Recommend anti-inflammatories and arch supports.  Regarding rash, there is no significant rash noted on exam but given description and now it is affecting mostly the webs of his hands, feel it is reasonable to cover for scabies.  He does have some evidence of pneumonitis at the site on his abdomen which is likely related to a metal belt buckle.  Recommend that he stop wearing that belt and can apply hydrocortisone.  Patient also was refill of his diabetes medication.  Blood sugar 276.  (Labs, imaging, consults)  Labs: I Ordered, and personally interpreted labs.  The pertinent results include: CBG 276  Imaging Studies ordered: I ordered imaging studies including none I independently visualized and interpreted imaging. I agree with the radiologist interpretation  Additional history obtained from chart review.  External records from outside source obtained and reviewed including prior evaluations  Cardiac Monitoring: The patient was maintained on a cardiac monitor.  I personally viewed and interpreted the cardiac monitored which showed an underlying rhythm of: Normal sinus rhythm  Reevaluation: After the interventions noted above, I reevaluated the patient and found that they have :stayed the same  Social Determinants of Health: Lives  independently  Disposition: Discharge  Co morbidities that complicate the patient evaluation  Past Medical History:  Diagnosis Date   CAD (coronary artery disease)    a. 07/2013 Neg MV;  b. 06/2015 NSTEMI/Cath:LM nl, LAD 84m,  D1/2/3 nl, RI mod/nl, LCX 41m, OM1 small, OM2 nl, RCA 20p, 146m, RPDA fills via collats from OM3, EF 35-45%.   CAD- occluded RCA with collaterals 06/24/2015   Chest pain 08/03/2013   CHF (congestive heart failure) (HCC)    Diabetes mellitus (HCC) 05/28/2008   Qualifier: Diagnosis of  By: Levon Hedger     Diet-controlled diabetes mellitus (HCC)    Dyslipidemia 06/23/2015   DYSMETABOLIC SYNDROME 08/13/2008   Qualifier: Diagnosis of  By: Daphine Deutscher FNP, [place]     Heart attack [facility])    Hyperlipidemia    Hypertension    Hypertensive heart disease    Ischemic cardiomyopathy    a. 07/2013 Echo: EF 50-55%, no rwma, Gr1 DD;  b. 06/2015 EF 35-45% by Vgram.    NSTEMI (non-ST elevated myocardial infarction) (HCC) 06/23/2015   Tobacco abuse    a. quit 06/2015.   TOBACCO ABUSE 08/05/2009   Qualifier: Diagnosis of  By: Levon Hedger       Medicines Meds ordered this encounter  Medications   permethrin (ELIMITE) 5 % cream    Sig: Apply to affected area once (from neck to soles of feet)    Dispense:  60 g    Refill:  0   glipiZIDE (GLUCOTROL) 5 MG tablet    Sig: Take 1 tablet (5 mg total) by mouth 2 (two) times daily.    Dispense:  30 tablet    Refill:  0   metFORMIN (GLUCOPHAGE) 1000 MG tablet    Sig: Take 0.5 tablets (500 mg total) by mouth 2 (two) times daily.    Dispense:  15 tablet    Refill:  0    I have reviewed the patients home  medicines and have made adjustments as needed  Problem List / ED Course: Problem List Items Addressed This Visit   None Visit Diagnoses     Pain in both feet    -  Primary   Pes planus of both feet       Rash                       Final Clinical Impression(s) / ED Diagnoses Final diagnoses:  Pain in both feet   Pes planus of both feet  Rash    Rx / DC Orders ED Discharge Orders          Ordered    permethrin (ELIMITE) 5 % cream        08/31/21 0523    glipiZIDE (GLUCOTROL) 5 MG tablet  2 times daily        08/31/21 0523    metFORMIN (GLUCOPHAGE) 1000 MG tablet  2 times daily        08/31/21 0523              Shon BatonHorton, Rajveer Handler F, MD 08/31/21 0530

## 2021-08-31 NOTE — ED Triage Notes (Signed)
Pt reports bilateral foot pain x 1 week as well as generalized itching/hives of unknown origin. Pt is a diabetic and ran out of his medications.

## 2021-08-31 NOTE — ED Notes (Signed)
Pt verbalizes understanding of discharge instructions. Opportunity for questioning and answers were provided. Pt discharged from ED to home.   ? ?

## 2021-08-31 NOTE — Discharge Instructions (Signed)
You were seen today with concerns for foot pain and rash.  You will be given a prescription for permethrin to cover for scabies.  It is important that you apply this from the neck to the feet and wash off the next morning.  Additionally, you need to make sure to clean all linens in hot water.  Regarding your feet, you likely have some inflammation related to being flat-footed.  Wear arch supports in your shoes.  Take ibuprofen as needed for pain.

## 2021-10-09 ENCOUNTER — Emergency Department (HOSPITAL_COMMUNITY): Payer: Self-pay

## 2021-10-09 ENCOUNTER — Other Ambulatory Visit (HOSPITAL_COMMUNITY): Payer: Self-pay

## 2021-10-09 ENCOUNTER — Other Ambulatory Visit: Payer: Self-pay

## 2021-10-09 ENCOUNTER — Emergency Department (HOSPITAL_COMMUNITY)
Admission: EM | Admit: 2021-10-09 | Discharge: 2021-10-09 | Disposition: A | Discharge status: Home / Self Care | Payer: Self-pay | Attending: Emergency Medicine | Admitting: Emergency Medicine

## 2021-10-09 DIAGNOSIS — R2681 Unsteadiness on feet: Secondary | ICD-10-CM

## 2021-10-09 DIAGNOSIS — E119 Type 2 diabetes mellitus without complications: Secondary | ICD-10-CM | POA: Insufficient documentation

## 2021-10-09 DIAGNOSIS — R2689 Other abnormalities of gait and mobility: Secondary | ICD-10-CM | POA: Insufficient documentation

## 2021-10-09 DIAGNOSIS — R55 Syncope and collapse: Secondary | ICD-10-CM | POA: Insufficient documentation

## 2021-10-09 DIAGNOSIS — I2510 Atherosclerotic heart disease of native coronary artery without angina pectoris: Secondary | ICD-10-CM | POA: Insufficient documentation

## 2021-10-09 DIAGNOSIS — I110 Hypertensive heart disease with heart failure: Secondary | ICD-10-CM | POA: Insufficient documentation

## 2021-10-09 DIAGNOSIS — I509 Heart failure, unspecified: Secondary | ICD-10-CM | POA: Insufficient documentation

## 2021-10-09 LAB — CBC WITH DIFFERENTIAL/PLATELET
Abs Immature Granulocytes: 0.03 10*3/uL (ref 0.00–0.07)
Basophils Absolute: 0 10*3/uL (ref 0.0–0.1)
Basophils Relative: 1 %
Eosinophils Absolute: 0.4 10*3/uL (ref 0.0–0.5)
Eosinophils Relative: 5 %
HCT: 51.7 % (ref 39.0–52.0)
Hemoglobin: 16.5 g/dL (ref 13.0–17.0)
Immature Granulocytes: 0 %
Lymphocytes Relative: 40 %
Lymphs Abs: 2.9 10*3/uL (ref 0.7–4.0)
MCH: 28.3 pg (ref 26.0–34.0)
MCHC: 31.9 g/dL (ref 30.0–36.0)
MCV: 88.5 fL (ref 80.0–100.0)
Monocytes Absolute: 0.6 10*3/uL (ref 0.1–1.0)
Monocytes Relative: 8 %
Neutro Abs: 3.4 10*3/uL (ref 1.7–7.7)
Neutrophils Relative %: 46 %
Platelets: 236 10*3/uL (ref 150–400)
RBC: 5.84 MIL/uL — ABNORMAL HIGH (ref 4.22–5.81)
RDW: 12.5 % (ref 11.5–15.5)
WBC: 7.3 10*3/uL (ref 4.0–10.5)
nRBC: 0 % (ref 0.0–0.2)

## 2021-10-09 LAB — BASIC METABOLIC PANEL
Anion gap: 10 (ref 5–15)
BUN: 10 mg/dL (ref 6–20)
CO2: 25 mmol/L (ref 22–32)
Calcium: 9.2 mg/dL (ref 8.9–10.3)
Chloride: 102 mmol/L (ref 98–111)
Creatinine, Ser: 0.99 mg/dL (ref 0.61–1.24)
GFR, Estimated: >60 mL/min (ref >60)
Glucose, Bld: 140 mg/dL — ABNORMAL HIGH (ref 70–99)
Potassium: 4 mmol/L (ref 3.5–5.1)
Sodium: 137 mmol/L (ref 135–145)

## 2021-10-09 LAB — CBG MONITORING, ED: Glucose-Capillary: 145 mg/dL — ABNORMAL HIGH (ref 70–99)

## 2021-10-09 LAB — HEPATIC FUNCTION PANEL
ALT: 16 U/L (ref 0–44)
AST: 16 U/L (ref 15–41)
Albumin: 3.7 g/dL (ref 3.5–5.0)
Alkaline Phosphatase: 74 U/L (ref 38–126)
Bilirubin, Direct: 0.1 mg/dL (ref 0.0–0.2)
Indirect Bilirubin: 0.6 mg/dL (ref 0.3–0.9)
Total Bilirubin: 0.7 mg/dL (ref 0.3–1.2)
Total Protein: 6.3 g/dL — ABNORMAL LOW (ref 6.5–8.1)

## 2021-10-09 LAB — TROPONIN I (HIGH SENSITIVITY)
Troponin I (High Sensitivity): 9 ng/L (ref <18)
Troponin I (High Sensitivity): 8 ng/L (ref <18)

## 2021-10-09 LAB — BRAIN NATRIURETIC PEPTIDE: B Natriuretic Peptide: 43.1 pg/mL (ref 0.0–100.0)

## 2021-10-09 MED ORDER — GLIPIZIDE 5 MG PO TABS
5.0000 mg | ORAL_TABLET | Freq: Two times a day (BID) | ORAL | 0 refills | Status: DC
  Filled 2021-10-09: qty 60, 30d supply, fill #0

## 2021-10-09 MED ORDER — METFORMIN HCL 1000 MG PO TABS
500.0000 mg | ORAL_TABLET | Freq: Two times a day (BID) | ORAL | 0 refills | Status: DC
Start: 2021-10-09 — End: 2022-01-15
  Filled 2021-10-09: qty 30, 30d supply, fill #0

## 2021-10-09 MED ORDER — AMLODIPINE BESYLATE 10 MG PO TABS
10.0000 mg | ORAL_TABLET | Freq: Every day | ORAL | 0 refills | Status: DC
  Filled 2021-10-09: qty 30, 30d supply, fill #0

## 2021-10-09 MED ORDER — SODIUM CHLORIDE 0.9 % IV BOLUS
500.0000 mL | Freq: Once | INTRAVENOUS | Status: AC
  Administered 2021-10-09: 500 mL via INTRAVENOUS

## 2021-10-09 MED ORDER — LISINOPRIL 10 MG PO TABS
10.0000 mg | ORAL_TABLET | Freq: Every day | ORAL | 0 refills | Status: DC
  Filled 2021-10-09: qty 30, 30d supply, fill #0

## 2021-10-09 MED ORDER — METFORMIN HCL 1000 MG PO TABS: 500.0000 mg | ORAL_TABLET | Freq: Two times a day (BID) | ORAL | 0 refills | Status: DC

## 2021-10-09 MED ORDER — GLIPIZIDE 5 MG PO TABS
5.0000 mg | ORAL_TABLET | Freq: Two times a day (BID) | ORAL | 0 refills | Status: DC
Start: 2021-10-09 — End: 2021-10-09

## 2021-10-09 NOTE — ED Triage Notes (Signed)
Pt reports new onset dizziness after getting off at work at 0100. Pt also endorses blurry vision and difficulty walking. LKW 0245.  HX: MI

## 2021-10-09 NOTE — ED Notes (Signed)
Took vitals at 8:41am, patient would not allow [place] to use pulse ox so could not document oxygen however wanted to at least get BP , pulse and temp

## 2021-10-09 NOTE — ED Provider Triage Note (Signed)
Emergency Medicine Provider Triage Evaluation Note  Jimmy Parker , a 51 y.o. male  was evaluated in triage.  Pt complains of lightheaded dizziness generalized weakness started about 40 minutes ago, states she has become prescription was pharmacy.  He states that he felt dizzy as if the room was spinning was feeling off balance, he also said he had bilateral blurred vision but denies paresthesias in the upper or lower extremities, he states he feels generalized weakness, not unilateral, no recent head trauma, not anticoag, no history of CVAs.  Review of Systems  Positive: Lightheaded, chest pain Negative: Fevers chills  Physical Exam  BP (!) 166/96   Pulse 86   Temp 98 F (36.7 C) (Oral)   SpO2 100%  Gen:   Awake, no distress   Resp:  Normal effort  MSK:   Moves extremities without difficulty  Other:  Cranial nerves II through XII grossly intact no difficulty with word finding, following two-step commands, no unilateral weakness present, coordination intact with finger-to-nose, gait intact  Medical Decision Making  Medically screening exam initiated at 3:35 AM.  Appropriate orders placed.  Jimmy Parker was informed that the remainder of the evaluation will be completed by another provider, this initial triage assessment does not replace that evaluation, and the importance of remaining in the ED until their evaluation is complete.  Presents with generalized weakness, chest pain lab or imaging ordered will need further work-up.   Carroll Sage, PA-C 10/09/21 [phone]

## 2021-10-09 NOTE — Discharge Instructions (Signed)
Please follow-up with the primary care doctor listed.  We have scheduled the appointment for you.  Return with any new or suddenly worsening symptoms.

## 2021-10-09 NOTE — ED Notes (Signed)
Patient transported to MRI 

## 2021-10-09 NOTE — ED Notes (Signed)
Pt refused vitals to be updated 

## 2021-10-09 NOTE — ED Provider Notes (Signed)
Emergency Department Provider Note   I have reviewed the triage vital signs and the nursing notes.   HISTORY  Chief Complaint Dizziness   HPI Jimmy Parker is a 51 y.o. male with past history of diabetes, high cholesterol, hypertension, prior CAD presents emergency department acute onset dizziness with gait instability, vision changes, speech disturbance.  Patient states he got off work at 1 AM and went to the [employer] to get some coffee which is his typical routine.  He states he felt dizzy like he may fall over.  He fell like he was stumbling.  He states part of the interaction is somewhat unclear to him like he is forgotten but did not fully lose consciousness.  He states he leaned over on a counter to keep from falling for about 5 to 6 minutes and then ultimately store staff called 911.  He does not recall feeling any discomfort in his chest or shortness of breath.  No severe headaches.  He did not recall feeling any unilateral weakness or numbness but continues to feel like his gait is slightly off and that his speech may be slightly different although has difficulty fully describing this change.  No longer having vision issues.  Patient tells me that he is mostly noncompliant with his medications due to insurance issues. Not currently seeing a PCP or other specialist.    Past Medical History:  Diagnosis Date   CAD (coronary artery disease)    a. 07/2013 Neg MV;  b. 06/2015 NSTEMI/Cath:LM nl, LAD 17m,  D1/2/3 nl, RI mod/nl, LCX 8m, OM1 small, OM2 nl, RCA 20p, 172m, RPDA fills via collats from OM3, EF 35-45%.   CAD- occluded RCA with collaterals 06/24/2015   Chest pain 08/03/2013   CHF (congestive heart failure) (HCC)    Diabetes mellitus (HCC) 05/28/2008   Qualifier: Diagnosis of  By: Levon Hedger     Diet-controlled diabetes mellitus (HCC)    Dyslipidemia 06/23/2015   DYSMETABOLIC SYNDROME 08/13/2008   Qualifier: Diagnosis of  By: Daphine Deutscher FNP, Nykedtra     Heart attack [facility])     Hyperlipidemia    Hypertension    Hypertensive heart disease    Ischemic cardiomyopathy    a. 07/2013 Echo: EF 50-55%, no rwma, Gr1 DD;  b. 06/2015 EF 35-45% by Vgram.    NSTEMI (non-ST elevated myocardial infarction) (HCC) 06/23/2015   Tobacco abuse    a. quit 06/2015.   TOBACCO ABUSE 08/05/2009   Qualifier: Diagnosis of  By: Levon Hedger      Review of Systems  Constitutional: No fever/chills. Positive lightheadedness.  Eyes: No visual changes. ENT: No sore throat. Cardiovascular: Denies chest pain. Positive near syncope and diaphoresis.  Respiratory: Denies shortness of breath. Gastrointestinal: No abdominal pain.  No nausea, no vomiting.  No diarrhea.  No constipation. Genitourinary: Negative for dysuria. Musculoskeletal: Negative for back pain. Skin: Negative for rash. Neurological: Negative for headaches, focal weakness or numbness but feeling "unsteady" gait and nonspecific speech disturbance.   ____________________________________________   PHYSICAL EXAM:  VITAL SIGNS: ED Triage Vitals  Enc Vitals Group     BP 10/09/21 0323 (!) 166/96     Pulse Rate 10/09/21 0323 86     Resp 10/09/21 0840 12     Temp 10/09/21 0323 98 F (36.7 C)     Temp Source 10/09/21 0323 Oral     SpO2 10/09/21 0323 100 %   Constitutional: Alert and oriented. Well appearing and in no acute distress. Eyes: Conjunctivae are  normal. PERRL. EOMI. No nystagmus.  Head: Atraumatic. Nose: No congestion/rhinnorhea. Mouth/Throat: Mucous membranes are moist.  Neck: No stridor.   Cardiovascular: Normal rate, regular rhythm. Good peripheral circulation. Grossly normal heart sounds.   Respiratory: Normal respiratory effort.  No retractions. Lungs CTAB. Gastrointestinal: Soft and nontender. No distention.  Musculoskeletal: No lower extremity tenderness nor edema. No gross deformities of extremities. Neurologic:  Normal speech and language. No gross focal neurologic deficits are appreciated. 5/5 strength  in the bilateral upper/lower extremities. Normal finger to nose testing. Gait is slightly hesitant.  Skin:  Skin is warm, dry and intact. No rash noted.  ____________________________________________   LABS (all labs ordered are listed, but only abnormal results are displayed)  Labs Reviewed  BASIC METABOLIC PANEL - Abnormal; Notable for the following components:      Result Value   Glucose, Bld 140 (*)    All other components within normal limits  CBC WITH DIFFERENTIAL/PLATELET - Abnormal; Notable for the following components:   RBC 5.84 (*)    All other components within normal limits  HEPATIC FUNCTION PANEL - Abnormal; Notable for the following components:   Total Protein 6.3 (*)    All other components within normal limits  CBG MONITORING, ED - Abnormal; Notable for the following components:   Glucose-Capillary 145 (*)    All other components within normal limits  BRAIN NATRIURETIC PEPTIDE  TROPONIN I (HIGH SENSITIVITY)  TROPONIN I (HIGH SENSITIVITY)   ____________________________________________  EKG   EKG Interpretation  Date/Time:  Friday October 09 2021 03:28:48 EDT Ventricular Rate:  88 PR Interval:  140 QRS Duration: 86 QT Interval:  364 QTC Calculation: 440 R Axis:   7 Text Interpretation: Normal sinus rhythm Nonspecific T wave abnormality Abnormal ECG When compared with ECG of 22-Apr-2021 15:35, PREVIOUS ECG IS PRESENT Confirmed by Nanda Quinton [phone]) on 10/09/2021 10:28:33 AM        ____________________________________________  RADIOLOGY  MR BRAIN WO CONTRAST  Result Date: 10/09/2021 CLINICAL DATA:  Neuro deficit, acute, stroke suspected EXAM: MRI HEAD WITHOUT CONTRAST TECHNIQUE: Multiplanar, multiecho pulse sequences of the brain and surrounding structures were obtained without intravenous contrast. COMPARISON:  None Available. FINDINGS: Motion artifact is present. Brain: There is no acute infarction or intracranial hemorrhage. There is no intracranial  mass, mass effect, or edema. There is no hydrocephalus or extra-axial fluid collection. Ventricles and sulci are normal in size and configuration. Vascular: Major vessel flow voids at the skull base are preserved. Skull and upper cervical spine: Normal marrow signal is preserved. Sinuses/Orbits: Trace mucosal thickening.  Orbits are unremarkable. Other: Sella is unremarkable.  Trace fluid at the right mastoid tip. IMPRESSION: No evidence of recent infarction, hemorrhage, or mass. Electronically Signed   By: Macy Mis M.D.   On: 10/09/2021 12:06   CT Head Wo Contrast  Result Date: 10/09/2021 CLINICAL DATA:  Transient ischemic attack. EXAM: CT HEAD WITHOUT CONTRAST TECHNIQUE: Contiguous axial images were obtained from the base of the skull through the vertex without intravenous contrast. RADIATION DOSE REDUCTION: This exam was performed according to the departmental dose-optimization program which includes automated exposure control, adjustment of the mA and/or kV according to patient size and/or use of iterative reconstruction technique. COMPARISON:  Head CT 06/24/2016. FINDINGS: Brain: No evidence of acute infarction, hemorrhage, hydrocephalus, extra-axial collection or mass lesion/mass effect. Vascular: There are patchy calcifications of the carotid siphons but no hyperdense central vasculature Skull: No fracture or focal lesion. Sinuses/Orbits:  No acute finding. Other: There is trace fluid  in the right mastoid tip which was not seen previously. Other bilateral mastoid air cells are clear. Incidental note is made of a small scalp lipoma left of the midline in the forehead. IMPRESSION: No acute intracranial CT findings or intracranial interval changes. Trace right mastoid effusion new from the prior study. Electronically Signed   By: Almira Bar M.D.   On: 10/09/2021 05:12   DG Chest 2 View  Result Date: 10/09/2021 CLINICAL DATA:  51 year old male with shortness of breath. EXAM: CHEST - 2 VIEW  COMPARISON:  Chest radiographs 04/21/2021 and earlier. FINDINGS: Stable chronically somewhat low lung volumes. Normal cardiac size and mediastinal contours. Visualized tracheal air column is within normal limits. Both lungs appear stable and clear. EKG button artifact projects in both upper lungs. No pneumothorax or pleural effusion. No acute osseous abnormality identified. Negative visible bowel gas. IMPRESSION: Negative, no acute cardiopulmonary abnormality. Electronically Signed   By: Odessa Fleming M.D.   On: 10/09/2021 03:59    ____________________________________________   PROCEDURES  Procedure(s) performed:   Procedures  None ____________________________________________   INITIAL IMPRESSION / ASSESSMENT AND PLAN / ED COURSE  Pertinent labs & imaging results that were available during my care of the patient were reviewed by me and considered in my medical decision making (see chart for details).   This patient is Presenting for Evaluation of near syncope, which does require a range of treatment options, and is a complaint that involves a high risk of morbidity and mortality.  The Differential Diagnoses include dehydration, ACS, arrhythmia, CVA/TIA, metabolic derangement.  Critical Interventions-    Medications  sodium chloride 0.9 % bolus 500 mL (0 mLs Intravenous Stopped 10/09/21 1217)    Reassessment after intervention:  Symptoms improved.    I decided to review pertinent External Data, and in summary patient with two prior ED visits this year for unrelated issues.   Clinical Laboratory Tests Ordered, included troponin within normal limits x2.  BNP normal.  No acute kidney injury or electrolyte derangement.  Mild hyperglycemia without DKA.  Radiologic Tests Ordered, included CXR, MRI and CT head. I independently interpreted the images and agree with radiology interpretation.   Cardiac Monitor Tracing which shows NSR.   Social Determinants of Health Risk prior smoking history.    Consult complete with Case Mgmt - arranged for delivery of home meds to patient's room and scheduled PCP follow up.   Medical Decision Making: Summary:  Patient presents to the emergency department for evaluation of sounds most consistent with near syncope event.  He does describe some lingering gait and speech issues although on my exam these are not particularly evident.  No focal neurodeficits to strongly suspect stroke/LVO or to activate CODE STROKE. Will send patient for MRI and give IVF along with orthostatic vitals.  Reevaluation with update and discussion with patient. MRI reassuring. Arranged for scheduling PCP appointment and home meds to bedside.   Considered admission but ED workup is reassuring. Arranged for PCP follow up and home medications.   Disposition: discharge  ____________________________________________  FINAL CLINICAL IMPRESSION(S) / ED DIAGNOSES  Final diagnoses:  Near syncope  Gait instability     Note:  This document was prepared using Dragon voice recognition software and may include unintentional dictation errors.  Alona Bene, MD, [facility] Emergency Medicine    Britlyn Martine, Arlyss Repress, MD 10/09/21 1754

## 2021-10-09 NOTE — ED Notes (Signed)
Patient states the pulse ox is on evryone fingers and he is refusing vitals at this time

## 2021-10-19 ENCOUNTER — Other Ambulatory Visit: Payer: Self-pay

## 2021-10-19 ENCOUNTER — Encounter: Payer: Self-pay | Admitting: Student

## 2021-10-20 NOTE — Progress Notes (Signed)
Patient left before seeing physician.  This encounter was created in error - please disregard.

## 2021-12-16 ENCOUNTER — Encounter (HOSPITAL_COMMUNITY): Payer: Self-pay | Admitting: Emergency Medicine

## 2021-12-16 ENCOUNTER — Emergency Department (HOSPITAL_COMMUNITY)
Admission: EM | Admit: 2021-12-16 | Discharge: 2021-12-17 | Disposition: A | Discharge status: Home / Self Care | Payer: No Typology Code available for payment source | Attending: Emergency Medicine | Admitting: Emergency Medicine

## 2021-12-16 ENCOUNTER — Other Ambulatory Visit: Payer: Self-pay

## 2021-12-16 ENCOUNTER — Emergency Department (HOSPITAL_COMMUNITY): Payer: No Typology Code available for payment source

## 2021-12-16 DIAGNOSIS — R42 Dizziness and giddiness: Secondary | ICD-10-CM | POA: Diagnosis not present

## 2021-12-16 DIAGNOSIS — Z7984 Long term (current) use of oral hypoglycemic drugs: Secondary | ICD-10-CM | POA: Diagnosis not present

## 2021-12-16 DIAGNOSIS — I2510 Atherosclerotic heart disease of native coronary artery without angina pectoris: Secondary | ICD-10-CM | POA: Insufficient documentation

## 2021-12-16 DIAGNOSIS — I110 Hypertensive heart disease with heart failure: Secondary | ICD-10-CM | POA: Diagnosis not present

## 2021-12-16 DIAGNOSIS — I509 Heart failure, unspecified: Secondary | ICD-10-CM | POA: Insufficient documentation

## 2021-12-16 DIAGNOSIS — Z79899 Other long term (current) drug therapy: Secondary | ICD-10-CM | POA: Insufficient documentation

## 2021-12-16 DIAGNOSIS — F17200 Nicotine dependence, unspecified, uncomplicated: Secondary | ICD-10-CM | POA: Insufficient documentation

## 2021-12-16 DIAGNOSIS — E119 Type 2 diabetes mellitus without complications: Secondary | ICD-10-CM | POA: Diagnosis not present

## 2021-12-16 DIAGNOSIS — J40 Bronchitis, not specified as acute or chronic: Secondary | ICD-10-CM | POA: Diagnosis not present

## 2021-12-16 DIAGNOSIS — Z7982 Long term (current) use of aspirin: Secondary | ICD-10-CM | POA: Diagnosis not present

## 2021-12-16 DIAGNOSIS — R053 Chronic cough: Secondary | ICD-10-CM | POA: Diagnosis present

## 2021-12-16 LAB — CBC
HCT: 51.2 % (ref 39.0–52.0)
Hemoglobin: 17 g/dL (ref 13.0–17.0)
MCH: 29.1 pg (ref 26.0–34.0)
MCHC: 33.2 g/dL (ref 30.0–36.0)
MCV: 87.7 fL (ref 80.0–100.0)
Platelets: 257 10*3/uL (ref 150–400)
RBC: 5.84 MIL/uL — ABNORMAL HIGH (ref 4.22–5.81)
RDW: 13.1 % (ref 11.5–15.5)
WBC: 7.3 10*3/uL (ref 4.0–10.5)
nRBC: 0 % (ref 0.0–0.2)

## 2021-12-16 LAB — COMPREHENSIVE METABOLIC PANEL
ALT: 15 U/L (ref 0–44)
AST: 19 U/L (ref 15–41)
Albumin: 4.2 g/dL (ref 3.5–5.0)
Alkaline Phosphatase: 70 U/L (ref 38–126)
Anion gap: 9 (ref 5–15)
BUN: 13 mg/dL (ref 6–20)
CO2: 26 mmol/L (ref 22–32)
Calcium: 9.9 mg/dL (ref 8.9–10.3)
Chloride: 105 mmol/L (ref 98–111)
Creatinine, Ser: 1.07 mg/dL (ref 0.61–1.24)
GFR, Estimated: >60 mL/min (ref >60)
Glucose, Bld: 116 mg/dL — ABNORMAL HIGH (ref 70–99)
Potassium: 4.2 mmol/L (ref 3.5–5.1)
Sodium: 140 mmol/L (ref 135–145)
Total Bilirubin: 0.5 mg/dL (ref 0.3–1.2)
Total Protein: 6.8 g/dL (ref 6.5–8.1)

## 2021-12-16 MED ORDER — MECLIZINE HCL 25 MG PO TABS
25.0000 mg | ORAL_TABLET | Freq: Once | ORAL | Status: DC
  Filled 2021-12-16: qty 1

## 2021-12-16 MED ORDER — ALBUTEROL SULFATE HFA 108 (90 BASE) MCG/ACT IN AERS
2.0000 | INHALATION_SPRAY | Freq: Once | RESPIRATORY_TRACT | Status: AC
  Administered 2021-12-17: 2 via RESPIRATORY_TRACT
  Filled 2021-12-16: qty 6.7

## 2021-12-16 NOTE — ED Provider Triage Note (Signed)
Emergency Medicine Provider Triage Evaluation Note  Jimmy Parker , a 51 y.o. male  was evaluated in triage.  Pt complains of constant vertigo (but states it is not currently effecting him). States he has episodes of room spinning since his ER visit 6/30 (MRI negative for stroke)  States he is also coughing more over the past few months too  Pt is a weak but earnest historian.   Review of Systems  Positive: Vertigo, balance issues  Negative: Fever, CP  Physical Exam  BP (!) 159/90 (BP Location: Right Arm)   Pulse 91   Temp 99.2 F (37.3 C) (Oral)   Resp 18   Ht 5\' 7"  (1.702 m)   Wt 67 kg   SpO2 98%   BMI 23.13 kg/m  Gen:   Awake, no distress   Resp:  Normal effort  MSK:   Moves extremities without difficulty  Other:  Smile symmetric, grip and leg strength symmetric  Medical Decision Making  Medically screening exam initiated at 4:51 PM.  Appropriate orders placed.  Jimmy Parker was informed that the remainder of the evaluation will be completed by another provider, this initial triage assessment does not replace that evaluation, and the importance of remaining in the ED until their evaluation is complete.  Cough and vertigo  MRI neg 6/30   7/30 [place] [facility], DOLE 12/16/21 1656

## 2021-12-16 NOTE — ED Notes (Signed)
Patient refusing vital signs in waiting room. Patient states the equipment is dirty and does not want it touching him.

## 2021-12-16 NOTE — ED Notes (Signed)
Pt refuses vitals. States that he will not get them taken in the lobby that he will wait until he has been brought back to a room

## 2021-12-16 NOTE — ED Triage Notes (Signed)
Pt endorses chronic cough that has been causing him to become dizzy and feel like he could pass out. Also waking up at night coughing.

## 2021-12-16 NOTE — ED Provider Notes (Signed)
[facility] EMERGENCY DEPARTMENT Provider Note   CSN: [id number] Arrival date & time: 12/16/21  1603     History  Chief Complaint  Patient presents with   Cough   Dizziness    Jimmy Parker is a 51 y.o. male.  The history is provided by the patient.   Patient with multiple health conditions presents with multiple complaints.  He reports that he has had intermittent vertigo for months.  He was seen in the ER in June and his symptoms have continued since then.  Is not improved with medicines.  He reports yesterday he felt like he might pass out at work, so he has missed work recently.  No syncope is reported.  No new chest pain. No focal weakness is reported  Patient also reports chronic cough for months as well.  This also can trigger dizziness.  He does become short of breath and has cough at night.  He continues to smoke.  He reports phlegm without any hemoptysis.  Patient reports he has not been taking his medications recently   Past Medical History:  Diagnosis Date   CAD (coronary artery disease)    a. 07/2013 Neg MV;  b. 06/2015 NSTEMI/Cath:LM nl, LAD 60m,  D1/2/3 nl, RI mod/nl, LCX 19m, OM1 small, OM2 nl, RCA 20p, 173m, RPDA fills via collats from OM3, EF 35-45%.   CAD- occluded RCA with collaterals 06/24/2015   Chest pain 08/03/2013   CHF (congestive heart failure) (HCC)    Diabetes mellitus (HCC) 05/28/2008   Qualifier: Diagnosis of  By: Levon Hedger     Diet-controlled diabetes mellitus (HCC)    Dyslipidemia 06/23/2015   DYSMETABOLIC SYNDROME 08/13/2008   Qualifier: Diagnosis of  By: Daphine Deutscher FNP, Nykedtra     Heart attack [facility])    Hyperlipidemia    Hypertension    Hypertensive heart disease    Ischemic cardiomyopathy    a. 07/2013 Echo: EF 50-55%, no rwma, Gr1 DD;  b. 06/2015 EF 35-45% by Vgram.    NSTEMI (non-ST elevated myocardial infarction) (HCC) 06/23/2015   Tobacco abuse    a. quit 06/2015.   TOBACCO ABUSE 08/05/2009   Qualifier: Diagnosis of   By: Levon Hedger      Home Medications Prior to Admission medications   Medication Sig Start Date End Date Taking? Authorizing Provider  acetaminophen (TYLENOL) 160 MG/5ML liquid Take 500 mg by mouth 2 (two) times daily.    [provider]  amLODipine (NORVASC) 10 MG tablet Take 1 tablet (10 mg total) by mouth daily. 10/09/21 11/08/21  Maia Plan, MD  aspirin EC 81 MG EC tablet Take 1 tablet (81 mg total) by mouth daily. 06/24/15   Abelino Derrick, PA-C  chlorhexidine (PERIDEX) 0.12 % solution Use as directed 15 mLs in the mouth or throat 2 (two) times daily. 07/17/20   Mare Ferrari, PA-C  cholecalciferol (VITAMIN D) 1000 units tablet Take 4,000 Units by mouth daily.    [provider]  diphenhydrAMINE (BENADRYL) 25 MG tablet Take 1 tablet (25 mg total) by mouth every 6 (six) hours as needed. Patient taking differently: Take 25 mg by mouth every 6 (six) hours as needed for allergies.  10/21/17   Raeford Razor, MD  glipiZIDE (GLUCOTROL) 5 MG tablet Take 1 tablet (5 mg total) by mouth 2 (two) times daily. 10/09/21 11/08/21  Long, Arlyss Repress, MD  hydrocortisone 2.5 % lotion Apply topically 2 (two) times daily. 10/21/17   Raeford Razor, MD  isosorbide  mononitrate (IMDUR) 30 MG 24 hr tablet Take 0.5 tablets (15 mg total) by mouth daily. 07/04/15   Creig Hines, NP  JANUMET 50-1000 MG tablet Take 1 tablet by mouth 2 (two) times daily. 02/09/19   [provider]  lisinopril (ZESTRIL) 10 MG tablet Take 1 tablet (10 mg total) by mouth daily. 10/09/21 11/08/21  Long, Arlyss Repress, MD  metFORMIN (GLUCOPHAGE) 1000 MG tablet Take 1/2 tablets (500 mg total) by mouth 2 (two) times daily. 10/09/21 11/08/21  Long, Arlyss Repress, MD  methocarbamol (ROBAXIN) 500 MG tablet Take 1 tablet (500 mg total) by mouth 2 (two) times daily. 03/07/18   Muthersbaugh, Dahlia Client, PA-C  metoprolol tartrate (LOPRESSOR) 25 MG tablet Take 12.5 mg by mouth 2 (two) times daily.    [provider]   naproxen (NAPROSYN) 500 MG tablet Take 1 tablet (500 mg total) by mouth 2 (two) times daily with a meal. 03/07/18   Muthersbaugh, Dahlia Client, PA-C  nitroGLYCERIN (NITROSTAT) 0.4 MG SL tablet Place 1 tablet (0.4 mg total) under the tongue every 5 (five) minutes x 3 doses as needed for chest pain. 06/24/15   Abelino Derrick, PA-C  permethrin (ELIMITE) 5 % cream Apply to affected area once (from neck to soles of feet) 08/31/21   Horton, Mayer Masker, MD  rosuvastatin (CRESTOR) 20 MG tablet Take 1 tablet (20 mg total) by mouth daily. 07/23/19   Corky Crafts, MD  sertraline (ZOLOFT) 100 MG tablet Take 100 mg by mouth daily.    [provider]      Allergies    Patient has no known allergies.    Review of Systems   Review of Systems  Constitutional:  Positive for fatigue.  Respiratory:  Positive for cough and shortness of breath.   Cardiovascular:  Negative for chest pain.  Neurological:  Positive for dizziness.    Physical Exam Updated Vital Signs BP (!) 148/88   Pulse 88   Temp 98.9 F (37.2 C) (Oral)   Resp 18   Ht 1.702 m (5\' 7" )   Wt 67 kg   SpO2 100%   BMI 23.13 kg/m  Physical Exam CONSTITUTIONAL: Well developed/well nourished HEAD: Normocephalic/atraumatic EYES: EOMI/PERRL, no nystagmus,  no ptosis ENMT: Mucous membranes moist NECK: supple no meningeal signs CV: S1/S2 noted, no murmurs/rubs/gallops noted LUNGS: Scattered wheezing bilaterally ABDOMEN: soft, nontender NEURO:Awake/alert, face symmetric, no arm or leg drift is noted Equal 5/5 strength with shoulder abduction, elbow flex/extension, wrist flex/extension in upper extremities and equal hand grips bilaterally Equal 5/5 strength with hip flexion,knee flex/extension, foot dorsi/plantar flexion Cranial nerves 3/4/5/6/10/18/08/11/12 tested and intact Gait normal without ataxia No past pointing Sensation to light touch intact in all extremities EXTREMITIES: pulses normal, full ROM SKIN: warm, color  normal PSYCH: no abnormalities of mood noted  ED Results / Procedures / Treatments   Labs (all labs ordered are listed, but only abnormal results are displayed) Labs Reviewed  CBC - Abnormal; Notable for the following components:      Result Value   RBC 5.84 (*)    All other components within normal limits  COMPREHENSIVE METABOLIC PANEL - Abnormal; Notable for the following components:   Glucose, Bld 116 (*)    All other components within normal limits    EKG   EKG Interpretation  Date/Time:  Thursday December 17 2021 00:36:17 EDT Ventricular Rate:  79 PR Interval:  140 QRS Duration: 94 QT Interval:  396 QTC Calculation: 454 R Axis:   67 Text Interpretation:  Normal sinus rhythm T wave abnormality No significant change since last tracing Confirmed by Zadie Rhine [phone]) on 12/17/2021 12:59:18 AM        Radiology DG Chest 2 View  Result Date: 12/16/2021 CLINICAL DATA:  Cough EXAM: CHEST - 2 VIEW COMPARISON:  10/09/2021 FINDINGS: The heart size and mediastinal contours are within normal limits. Both lungs are clear. The visualized skeletal structures are unremarkable. IMPRESSION: No active cardiopulmonary disease. Electronically Signed   By: Ernie Avena M.D.   On: 12/16/2021 17:17    Procedures Procedures    Medications Ordered in ED Medications  albuterol (VENTOLIN HFA) 108 (90 Base) MCG/ACT inhaler 2 puff (2 puffs Inhalation Given 12/17/21 0052)    ED Course/ Medical Decision Making/ A&P Clinical Course as of 12/17/21 0102  Thu Dec 17, 2021  0023 Patient with chronic symptoms for months.  He reports it felt worse at work and therefore he went to be evaluated.  He has no signs of acute CVA at this time.  He does have wheezing on exam, but a negative chest x-ray.  He admits to medication noncompliance [DW]  0101 Patient overall well-appearing, no acute distress.  Symptoms have been chronic for months, and I do not see any signs of acute neurologic or  cardiovascular emergency.  Encourage patient to quit smoking.  We will help facilitate outpatient follow-up with [facility] consult.  We will also give referral to neurology for chronic dizziness.  He has already had previous negative MRI.  Encourage patient to follow-up and continue his home medications. [DW]    Clinical Course User Index [DW] Zadie Rhine, MD                           Medical Decision Making Risk Prescription drug management.   This patient presents to the ED for concern of cough and dizziness, this involves an extensive number of treatment options, and is a complaint that carries with it a high risk of complications and morbidity.  The differential diagnosis includes but is not limited to CVA, intracranial hemorrhage, electrolyte abnormality Acute coronary syndrome  Comorbidities that complicate the patient evaluation: Patient's presentation is complicated by their history of CAD, diabetes, hypertension  Social Determinants of Health: Patient's lack of prescription access, impaired access to primary care, and tobacco abuse   increases the complexity of managing their presentation  Additional history obtained: Records reviewed  cardiology records reviewed  Lab Tests: I Ordered, and personally interpreted labs.  The pertinent results include: No abnormalities  Imaging Studies ordered: I ordered imaging studies including X-ray chest   I independently visualized and interpreted imaging which showed no acute findings I agree with the radiologist interpretation   Medicines ordered and prescription drug management: I ordered medication including albuterol for wheezing Reevaluation of the patient after these medicines showed that the patient    improved   Consultations Obtained: I requested consultation with the consultant social work to assist with outpatient management After the interventions noted above, I reevaluated the patient and found that they have  :improved  Complexity of problems addressed: Patient's presentation is most consistent with  acute presentation with potential threat to life or bodily function  Disposition: After consideration of the diagnostic results and the patient's response to treatment,  I feel that the patent would benefit from discharge   .           Final Clinical Impression(s) / ED Diagnoses Final diagnoses:  Bronchitis  Vertigo    Rx / DC Orders ED Discharge Orders          Ordered    Ambulatory referral to Neurology       Comments: An appointment is requested in approximately: 2 weeks   12/16/21 2353              Zadie Rhine, MD 12/17/21 [phone]

## 2021-12-17 NOTE — Discharge Instructions (Signed)

## 2022-01-15 ENCOUNTER — Other Ambulatory Visit: Payer: No Typology Code available for payment source

## 2022-01-15 ENCOUNTER — Other Ambulatory Visit: Payer: Self-pay

## 2022-01-15 ENCOUNTER — Ambulatory Visit: Payer: No Typology Code available for payment source | Attending: Internal Medicine | Admitting: Internal Medicine

## 2022-01-15 ENCOUNTER — Encounter: Payer: Self-pay | Admitting: Internal Medicine

## 2022-01-15 VITALS — BP 175/112 | HR 97 | Ht 66.0 in | Wt 145.2 lb

## 2022-01-15 DIAGNOSIS — F17210 Nicotine dependence, cigarettes, uncomplicated: Secondary | ICD-10-CM

## 2022-01-15 DIAGNOSIS — R42 Dizziness and giddiness: Secondary | ICD-10-CM | POA: Diagnosis not present

## 2022-01-15 DIAGNOSIS — F17200 Nicotine dependence, unspecified, uncomplicated: Secondary | ICD-10-CM

## 2022-01-15 DIAGNOSIS — R053 Chronic cough: Secondary | ICD-10-CM | POA: Diagnosis not present

## 2022-01-15 DIAGNOSIS — R634 Abnormal weight loss: Secondary | ICD-10-CM | POA: Diagnosis not present

## 2022-01-15 DIAGNOSIS — E1159 Type 2 diabetes mellitus with other circulatory complications: Secondary | ICD-10-CM | POA: Diagnosis not present

## 2022-01-15 DIAGNOSIS — I152 Hypertension secondary to endocrine disorders: Secondary | ICD-10-CM

## 2022-01-15 LAB — GLUCOSE, POCT (MANUAL RESULT ENTRY): POC Glucose: 143 mg/dl — AB (ref 70–99)

## 2022-01-15 LAB — POCT GLYCOSYLATED HEMOGLOBIN (HGB A1C): HbA1c, POC (controlled diabetic range): 7.7 % — AB (ref 0.0–7.0)

## 2022-01-15 MED ORDER — ASPIRIN 81 MG PO TBEC: 81.0000 mg | DELAYED_RELEASE_TABLET | Freq: Every day | ORAL | 12 refills | Status: DC

## 2022-01-15 MED ORDER — AZITHROMYCIN 250 MG PO TABS
ORAL_TABLET | ORAL | 0 refills | Status: AC
  Filled 2022-01-15: qty 6, 5d supply, fill #0

## 2022-01-15 MED ORDER — ASPIRIN 81 MG PO TBEC: 81.0000 mg | DELAYED_RELEASE_TABLET | Freq: Every day | ORAL | Status: DC

## 2022-01-15 MED ORDER — AMLODIPINE BESYLATE 10 MG PO TABS
10.0000 mg | ORAL_TABLET | Freq: Every day | ORAL | 3 refills | Status: DC
  Filled 2022-01-15: qty 30, 30d supply, fill #0

## 2022-01-15 MED ORDER — DAPAGLIFLOZIN PROPANEDIOL 5 MG PO TABS
5.0000 mg | ORAL_TABLET | Freq: Every day | ORAL | 3 refills | Status: DC
  Filled 2022-01-15 (×2): qty 30, 30d supply, fill #0

## 2022-01-15 MED ORDER — BUDESONIDE-FORMOTEROL FUMARATE 80-4.5 MCG/ACT IN AERO
2.0000 | INHALATION_SPRAY | Freq: Two times a day (BID) | RESPIRATORY_TRACT | 3 refills | Status: DC
  Filled 2022-01-15: qty 10.2, 30d supply, fill #0

## 2022-01-15 MED ORDER — NICOTINE POLACRILEX 2 MG MT GUM
2.0000 mg | CHEWING_GUM | OROMUCOSAL | 0 refills | Status: DC | PRN
  Filled 2022-01-15: qty 50, 30d supply, fill #0

## 2022-01-15 MED ORDER — ROSUVASTATIN CALCIUM 20 MG PO TABS
20.0000 mg | ORAL_TABLET | Freq: Every day | ORAL | 3 refills | Status: DC
  Filled 2022-01-15: qty 30, 30d supply, fill #0

## 2022-01-15 MED ORDER — LISINOPRIL 10 MG PO TABS
10.0000 mg | ORAL_TABLET | Freq: Every day | ORAL | 3 refills | Status: DC
  Filled 2022-01-15: qty 30, 30d supply, fill #0

## 2022-01-15 MED ORDER — PREDNISONE 20 MG PO TABS
20.0000 mg | ORAL_TABLET | Freq: Every day | ORAL | 0 refills | Status: DC
  Filled 2022-01-15: qty 5, 5d supply, fill #0

## 2022-01-15 NOTE — Progress Notes (Signed)
Pt would like to speak to pcp about cough.

## 2022-01-15 NOTE — Progress Notes (Signed)
Patient ID: Jimmy Parker, male    DOB: Jul 22, 1970  MRN: [id number]  CC: Hospitalization Follow-up (/)   Subjective: Jimmy Parker is a 51 y.o. male who presents for new pt visit and ER f/u His concerns today include:  Patient with history of HTN, CAD (NSTEMI 2017, ischemic cardiomyopathy), DM type II, HL, tobacco dependence  Previous PCP was [facility] [place].  Last seen 5 yrs ago due to lack of finances/insurance.  C/o SOB and cough productive of clear to yellow mucus x 3 mths -SOB more so when he coughs and at nights. No blood in phlegm.  Endorses wheezing at nights and during the day; yesterday was the worse day  No fever No LE edema.  Sleeps on 1 pillow.  CP only with coughing -Someone at work had pneumonia 3 mths ago; feels his symptoms started after this exposure Had COVID infection in 2019.  Did COVID test 2 wks ago and it was negative x 2. Given Albuterol inhaler from an ER visit but has not found it helpful. -endorses wgh loss for past 1.5 yrs.  Reports avg wgh was 150 lbs.  Looking at his wgh tread in EMR, he Was 170 lbs in 04/2019, then 164 lbs in 04/2021.  Now 145 lbs. Wgh loss has not been intentional. -smoked since age 68.  Quit for 5 yrs (around 2012) but started again in 2017 for 1 mth. Restarted 1.5 yrs ago.  Smoking 1/2 pk a day.  Was a pk a day most of the time.  Trying to quit.   -no blood in stools or black stool   C/O dizziness and lightheadness intermittently x 1 yr Feels drinking coffee, sudden turning of head and when he goes from sitting to standing. Last 5 mins but last time it lasted 20 mins.  No dizziness when turns in beds or gets out bed. No associated HA or hearing loss Seen in ER 10/09/2021 for acute dizziness with gait instability and vision changes.  BP was 166/96.  Except for hesitant gait, neuro exam was non-focal.  CT head and MRI head negative for infarct, hemorrhage or mass.  BP elev Checks BP 2x/wk Gives range 140s/90s.  Usual for  him. According to old med list in system and old med bottles that he has with him today, he should be on Metoprolol, Isosorbide, Norvasc and lisinopril.  Admits that he has not been taking meds consistently in a while.  In regards to his DM, he was previously on Glucotrol and Metformin.  Does not want to go back on Metformin because he heard a lot of bad stuff about it. Currently not on any med.  Does not check BS.   Patient Active Problem List   Diagnosis Date Noted   CAD (coronary artery disease)    Ischemic cardiomyopathy    Diet-controlled diabetes mellitus ([place])    Hypertensive heart disease    Tobacco abuse    CAD- occluded RCA with collaterals 06/24/2015   NSTEMI (non-ST elevated myocardial infarction) ([place]) 06/23/2015   Dyslipidemia 06/23/2015   Chest pain 08/03/2013   TOBACCO ABUSE [id number]   DYSMETABOLIC SYNDROME XX123456   Diabetes mellitus ([place]) 05/28/2008   HYPERTENSION, BENIGN ESSENTIAL 06/08/2007     Current Outpatient Medications on File Prior to Visit  Medication Sig Dispense Refill   acetaminophen (TYLENOL) 160 MG/5ML liquid Take 500 mg by mouth 2 (two) times daily.     No current facility-administered medications on file prior to visit.  No Known Allergies  Social History   Socioeconomic History   Marital status: Single    Spouse name: Not on file   Number of children: Not on file   Years of education: Not on file   Highest education level: Not on file  Occupational History   Not on file  Tobacco Use   Smoking status: Former    Packs/day: 0.50    Types: Cigarettes    Start date: 06/27/2015   Smokeless tobacco: Never  Substance and Sexual Activity   Alcohol use: No    Alcohol/week: 0.0 standard drinks of alcohol   Drug use: No   Sexual activity: Not on file  Other Topics Concern   Not on file  Social History Narrative   Not on file   Social Determinants of Health   Financial Resource Strain: Not on file  Food Insecurity: Not on file   Transportation Needs: Not on file  Physical Activity: Not on file  Stress: Not on file  Social Connections: Not on file  Intimate Partner Violence: Not on file    Family History  Problem Relation Age of Onset   Heart attack Maternal Grandfather    Hypertension Maternal Grandfather    Heart attack Paternal Grandmother     Past Surgical History:  Procedure Laterality Date   CARDIAC CATHETERIZATION N/A 06/23/2015   Procedure: Left Heart Cath and Coronary Angiography;  Surgeon: Burnell Blanks, MD;  Location: [facility];  Service: Cardiovascular;  Laterality: N/A;   FRACTURE SURGERY     femur and hip   SHOULDER SURGERY     Right: Patient denies any hx of shoulder surgery    ROS: Review of Systems Negative except as stated above  PHYSICAL EXAM: BP (!) 175/112   Pulse 97   Ht 5\' 6"  (1.676 m)   Wt 145 lb 3.2 oz (65.9 kg)   SpO2 97%   BMI 23.44 kg/m   Wt Readings from Last 3 Encounters:  01/15/22 145 lb 3.2 oz (65.9 kg)  12/16/21 147 lb 11.3 oz (67 kg)  10/19/21 149 lb 6.4 oz (67.8 kg)  BP laying 175/110, P85 BP sitting 169/104, P88 Standing 177/115, P 89  Physical Exam  General appearance - alert, well appearing, middle age AAM and in no distress Mental status - normal mood, behavior, speech, dress, motor activity, and thought processes Eyes - pupils equal and reactive, extraocular eye movements intact Mouth - mucous membranes moist, pharynx normal without lesions Neck - supple, no significant adenopathy, no thyroid enlargement or nodules Chest - clear to auscultation, no wheezes, rales or rhonchi, symmetric air entry Heart - normal rate, regular rhythm, normal S1, S2, no murmurs, rubs, clicks or gallops Neurological - cranial nerves II through XII intact, motor and sensory grossly normal bilaterally Extremities - no LE edema  Lab Results  Component Value Date   HGBA1C 7.7 (A) 01/15/2022        Latest Ref Rng & Units 12/16/2021    5:01 PM  10/09/2021   11:20 AM 10/09/2021    3:40 AM  CMP  Glucose 70 - 99 mg/dL 116   140   BUN 6 - 20 mg/dL 13   10   Creatinine 0.61 - 1.24 mg/dL 1.07   0.99   Sodium 135 - 145 mmol/L 140   137   Potassium 3.5 - 5.1 mmol/L 4.2   4.0   Chloride 98 - 111 mmol/L 105   102   CO2 22 - 32  mmol/L 26   25   Calcium 8.9 - 10.3 mg/dL 9.9   9.2   Total Protein 6.5 - 8.1 g/dL 6.8  6.3    Total Bilirubin 0.3 - 1.2 mg/dL 0.5  0.7    Alkaline Phos 38 - 126 U/L 70  74    AST 15 - 41 U/L 19  16    ALT 0 - 44 U/L 15  16     Lipid Panel     Component Value Date/Time   CHOL 116 06/24/2015 0210   TRIG 75 06/24/2015 0210   HDL 40 (L) 06/24/2015 0210   CHOLHDL 2.9 06/24/2015 0210   VLDL 15 06/24/2015 0210   LDLCALC 61 06/24/2015 0210    CBC    Component Value Date/Time   WBC 7.3 12/16/2021 1701   RBC 5.84 (H) 12/16/2021 1701   HGB 17.0 12/16/2021 1701   HCT 51.2 12/16/2021 1701   PLT 257 12/16/2021 1701   MCV 87.7 12/16/2021 1701   MCH 29.1 12/16/2021 1701   MCHC 33.2 12/16/2021 1701   RDW 13.1 12/16/2021 1701   LYMPHSABS 2.9 10/09/2021 0340   MONOABS 0.6 10/09/2021 0340   EOSABS 0.4 10/09/2021 0340   BASOSABS 0.0 10/09/2021 0340    ASSESSMENT AND PLAN: 1. 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 Also have to consider lung CA given worsening cough, SOB with wgh loss. -strongly advised to quit smoking Start Symbicort inhaler.  Tried to get clinical pharmacist to show technique but he was not available.  Will have him scheduled. Add short course of Prednisone and a Z-pack Will order CT of chest Advised on how to apply for [facility] discount card - budesonide-formoterol (SYMBICORT) 80-4.5 MCG/ACT inhaler; Inhale 2 puffs into the lungs 2 (two) times daily.  Dispense: 10.2 g; Refill: 3 - predniSONE (DELTASONE) 20 MG tablet; Take 1 tablet (20 mg total) by mouth daily with breakfast.  Dispense: 5 tablet; Refill: 0 - azithromycin (ZITHROMAX Z-PAK) 250 MG tablet; Take  2 tablets (500 mg total) daily for 1 day, THEN 1 tablet (250 mg total) daily for 4 days.  Dispense: 6 tablet; Refill: 0  2. Weight loss, unintentional See discussion above. Will get him up to date with age appropriate cancer screenings on next visit - TSH  3. Dizziness Likely peripheral rather than central  -advise importance of compliance with BP meds Go slow with position changes. Stay hydrated.    4. Hypertension associated with diabetes ([place]) Not at goal. Restart Lisinopril and Norvasc.  Hold off on restarting BB for now until I see how he responds to inhaler - lisinopril (ZESTRIL) 10 MG tablet; Take 1 tablet (10 mg total) by mouth daily.  Dispense: 30 tablet; Refill: 3 - amLODipine (NORVASC) 10 MG tablet; Take 1 tablet (10 mg total) by mouth daily.  Dispense: 30 tablet; Refill: 3  5. Tobacco dependence Pt is current smoker. Patient advised to quit smoking. Discussed health risks associated with smoking including lung and other types of cancers, chronic lung diseases and CV risks.. Pt ready to give trail of quitting.   Discussed methods to help quit including quitting cold [place], use of NRT, Chantix and Bupropion.  Pt wanting to try: nicotine gum _3_ Minutes spent on counseling. F/U:  asses progress on subsequent visit  - nicotine polacrilex (NICORETTE) 2 MG gum; Take 1 each (2 mg total) by mouth as needed for smoking cessation.  Dispense: 100 tablet; Refill: 0  6. Type 2 diabetes mellitus with  other circulatory complications ([place]) Not at goal.  I tried to dispel false info that he heard about Metformin and encourage use but pt still declined.  Will use Jardiance instead.  Restart Crestor given hx of DM and CAD - POCT glucose (manual entry) - POCT glycosylated hemoglobin (Hb A1C) - dapagliflozin propanediol (FARXIGA) 5 MG TABS tablet; Take 1 tablet (5 mg total) by mouth daily before breakfast.  Dispense: 30 tablet; Refill: 3 - aspirin EC 81 MG tablet; Take 1 tablet (81 mg  total) by mouth daily.  Dispense: 30 tablet - rosuvastatin (CRESTOR) 20 MG tablet; Take 1 tablet (20 mg total) by mouth daily.  Dispense: 30 tablet; Refill: 3 - Microalbumin / creatinine urine ratio     Patient was given the opportunity to ask questions.  Patient verbalized understanding of the plan and was able to repeat key elements of the plan.   This documentation was completed using Radio producer.  Any transcriptional errors are unintentional.  Orders Placed This Encounter  Procedures   TSH   Microalbumin / creatinine urine ratio   POCT glucose (manual entry)   POCT glycosylated hemoglobin (Hb A1C)     Requested Prescriptions   Signed Prescriptions Disp Refills   budesonide-formoterol (SYMBICORT) 80-4.5 MCG/ACT inhaler 10.2 g 3    Sig: Inhale 2 puffs into the lungs 2 (two) times daily.   lisinopril (ZESTRIL) 10 MG tablet 30 tablet 3    Sig: Take 1 tablet (10 mg total) by mouth daily.   amLODipine (NORVASC) 10 MG tablet 30 tablet 3    Sig: Take 1 tablet (10 mg total) by mouth daily.   dapagliflozin propanediol (FARXIGA) 5 MG TABS tablet 30 tablet 3    Sig: Take 1 tablet (5 mg total) by mouth daily before breakfast.   predniSONE (DELTASONE) 20 MG tablet 5 tablet 0    Sig: Take 1 tablet (20 mg total) by mouth daily with breakfast.   azithromycin (ZITHROMAX Z-PAK) 250 MG tablet 6 tablet 0    Sig: Take 2 tablets (500 mg total) daily for 1 day, THEN 1 tablet (250 mg total) daily for 4 days.   nicotine polacrilex (NICORETTE) 2 MG gum 100 tablet 0    Sig: Take 1 each (2 mg total) by mouth as needed for smoking cessation.   aspirin EC 81 MG tablet 30 tablet     Sig: Take 1 tablet (81 mg total) by mouth daily.   aspirin EC 81 MG tablet 30 tablet 12    Sig: Take 1 tablet (81 mg total) by mouth daily. Swallow whole.   rosuvastatin (CRESTOR) 20 MG tablet 30 tablet 3    Sig: Take 1 tablet (20 mg total) by mouth daily.    Return in about 1 month (around  02/15/2022).  Karle Plumber, MD, FACP

## 2022-01-16 LAB — TSH: TSH: 2.04 u[IU]/mL (ref 0.450–4.500)

## 2022-01-16 LAB — MICROALBUMIN / CREATININE URINE RATIO
Creatinine, Urine: 116.5 mg/dL
Microalb/Creat Ratio: 14 mg/g creat (ref 0–29)
Microalbumin, Urine: 16.4 ug/mL

## 2022-01-22 ENCOUNTER — Other Ambulatory Visit: Payer: Self-pay

## 2022-04-01 ENCOUNTER — Other Ambulatory Visit: Payer: Self-pay

## 2022-04-01 ENCOUNTER — Ambulatory Visit: Payer: No Typology Code available for payment source | Attending: Internal Medicine | Admitting: Internal Medicine

## 2022-04-01 ENCOUNTER — Encounter: Payer: Self-pay | Admitting: Internal Medicine

## 2022-04-01 VITALS — BP 135/70 | HR 84 | Temp 98.4°F | Ht 66.0 in | Wt 147.0 lb

## 2022-04-01 DIAGNOSIS — F17200 Nicotine dependence, unspecified, uncomplicated: Secondary | ICD-10-CM | POA: Diagnosis not present

## 2022-04-01 DIAGNOSIS — R053 Chronic cough: Secondary | ICD-10-CM

## 2022-04-01 DIAGNOSIS — Z23 Encounter for immunization: Secondary | ICD-10-CM | POA: Diagnosis not present

## 2022-04-01 DIAGNOSIS — Z1211 Encounter for screening for malignant neoplasm of colon: Secondary | ICD-10-CM | POA: Diagnosis not present

## 2022-04-01 DIAGNOSIS — E1159 Type 2 diabetes mellitus with other circulatory complications: Secondary | ICD-10-CM

## 2022-04-01 DIAGNOSIS — Z5320 Procedure and treatment not carried out because of patient's decision for unspecified reasons: Secondary | ICD-10-CM

## 2022-04-01 DIAGNOSIS — I2510 Atherosclerotic heart disease of native coronary artery without angina pectoris: Secondary | ICD-10-CM

## 2022-04-01 DIAGNOSIS — I152 Hypertension secondary to endocrine disorders: Secondary | ICD-10-CM

## 2022-04-01 MED ORDER — NICOTINE POLACRILEX 2 MG MT GUM
2.0000 mg | CHEWING_GUM | OROMUCOSAL | 3 refills | Status: DC | PRN
  Filled 2022-04-01: qty 100, 11d supply, fill #0
  Filled 2023-03-24: qty 110, 13d supply, fill #0

## 2022-04-01 NOTE — Progress Notes (Signed)
Patient ID: Jimmy Parker, male    DOB: 1970-07-07  MRN: [id number]  CC: Cough (Chronic cough f/u. Med refills. Valentino Hue to flu vax./Discuss metformin & glipizide. Pt states that he is currently taking but not on chart.)   Subjective: Jimmy Parker is a 51 y.o. male who presents for f/u cough and unintentional wgh loss His concerns today include:  Patient with history of HTN, CAD (NSTEMI 2017, ischemic cardiomyopathy), DM type II, HL, tobacco dependence   Did not bring meds with him  Cough: Started on Symbicort on last visit.  Used the Symbicort until he ran out; was not aware of RF on rxn.  Found it helpful He quit smoking cigarettes but still smoking cigars about 3 a day. Request RF on nicotine gum which was helpful.   Cough decreased significantly since he quit the cigarettes. He was concerned about unexplained weight loss on last visit.  Weight has remained fairly stable since we last saw him in October.  We had plan to try to get him up-to-date with age-appropriate cancer screenings today.  DM:   On last visit, he indicated that he was previously on Glucotrol and metformin but did not want to get back on metformin.  He states that he heard a lot of bad stuff about metformin.  I tried to dispel false information but he was adamant about not getting back on metformin.  So we placed him on Farxiga instead. Did not start the [place] because he did not know what it was for.  A1c on last visit was 7.7  HTN/CAD:  not taking Lisinopril.  "I just haven't been taking it."  Not taking Norvasc consistently either. Limits salt  Checks BP 2x/wk.  Running in 140s SBP, 70-80s DBP Not taking Crestor consistently either.  Reports he never had issue with cholesterol  HM:  agree tos flu vaccine, no to shingles vaccine. Had eye exam last mth with Dr. Beatriz Chancellor, no retinopathy.  No fhx of colon CA.  No c-scope in past, concern with cost.  Trying to get better insurance.  Currently has Ambetter. Qualifies for  lung cancer screen but wants to hold off.  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 Patient Active Problem List   Diagnosis Date Noted   CAD (coronary artery disease)    Ischemic cardiomyopathy    Diet-controlled diabetes mellitus ([facility])    Hypertensive heart disease    Tobacco abuse    CAD- occluded RCA with collaterals 06/24/2015   NSTEMI (non-ST elevated myocardial infarction) ([facility]) 06/23/2015   Dyslipidemia 06/23/2015   Chest pain 08/03/2013   TOBACCO ABUSE 08/05/2009   DYSMETABOLIC SYNDROME 08/13/2008   Diabetes mellitus ([facility]) 05/28/2008   HYPERTENSION, BENIGN ESSENTIAL 06/08/2007     Current Outpatient Medications on File Prior to Visit  Medication Sig Dispense Refill   acetaminophen (TYLENOL) 160 MG/5ML liquid Take 500 mg by mouth 2 (two) times daily.     amLODipine (NORVASC) 10 MG tablet Take 1 tablet (10 mg total) by mouth daily. 30 tablet 3   aspirin EC 81 MG tablet Take 1 tablet (81 mg total) by mouth daily. 30 tablet    aspirin EC 81 MG tablet Take 1 tablet (81 mg total) by mouth daily. Swallow whole. 30 tablet 12   budesonide-formoterol (SYMBICORT) 80-4.5 MCG/ACT inhaler Inhale 2 puffs into the lungs 2 (two) times daily. 10.2 g 3   dapagliflozin propanediol (FARXIGA) 5 MG TABS tablet Take 1 tablet (5 mg total) by mouth daily before  breakfast. 30 tablet 3   predniSONE (DELTASONE) 20 MG tablet Take 1 tablet (20 mg total) by mouth daily with breakfast. 5 tablet 0   rosuvastatin (CRESTOR) 20 MG tablet Take 1 tablet (20 mg total) by mouth daily. 30 tablet 3   No current facility-administered medications on file prior to visit.    No Known Allergies  Social History   Socioeconomic History   Marital status: Single    Spouse name: Not on file   Number of children: Not on file   Years of education: Not on file   Highest education level: Not on file  Occupational History   Not on file  Tobacco Use   Smoking status: Former    Packs/day: 0.50    Types: Cigarettes    Start  date: 06/27/2015   Smokeless tobacco: Never  Substance and Sexual Activity   Alcohol use: No    Alcohol/week: 0.0 standard drinks of alcohol   Drug use: No   Sexual activity: Not on file  Other Topics Concern   Not on file  Social History Narrative   Not on file   Social Determinants of Health   Financial Resource Strain: Not on file  Food Insecurity: Not on file  Transportation Needs: Not on file  Physical Activity: Not on file  Stress: Not on file  Social Connections: Not on file  Intimate Partner Violence: Not on file    Family History  Problem Relation Age of Onset   Heart attack Maternal Grandfather    Hypertension Maternal Grandfather    Heart attack Paternal Grandmother     Past Surgical History:  Procedure Laterality Date   CARDIAC CATHETERIZATION N/A 06/23/2015   Procedure: Left Heart Cath and Coronary Angiography;  Surgeon: Kathleene Hazelhristopher D McAlhany, MD;  [facility];  Service: Cardiovascular;  Laterality: N/A;   FRACTURE SURGERY     femur and hip   SHOULDER SURGERY     Right: Patient denies any hx of shoulder surgery    ROS: Review of Systems Negative except as stated above  PHYSICAL EXAM: BP 135/70   Pulse 84   Temp 98.4 F (36.9 C) (Oral)   Ht 5\' 6"  (1.676 m)   Wt 147 lb (66.7 kg)   SpO2 100%   BMI 23.73 kg/m   Wt Readings from Last 3 Encounters:  04/01/22 147 lb (66.7 kg)  01/15/22 145 lb 3.2 oz (65.9 kg)  12/16/21 147 lb 11.3 oz (67 kg)    Physical Exam   General appearance - alert, well appearing, middle-age African-American male and in no distress Mental status - normal mood, behavior, speech, dress, motor activity, and thought processes Neck - supple, no significant adenopathy Chest - clear to auscultation, no wheezes, rales or rhonchi, symmetric air entry Heart - normal rate, regular rhythm, normal S1, S2, no murmurs, rubs, clicks or gallops Extremities - peripheral pulses normal, no pedal edema, no clubbing or  cyanosis     Latest Ref Rng & Units 12/16/2021    5:01 PM 10/09/2021   11:20 AM 10/09/2021    3:40 AM  CMP  Glucose 70 - 99 mg/dL 161116   [id number]   BUN 6 - 20 mg/dL 13   10   Creatinine 0.450.61 - 1.24 mg/dL 4.091.07   8.110.99   Sodium 914135 - 145 mmol/L 140   137   Potassium 3.5 - 5.1 mmol/L 4.2   4.0   Chloride 98 - 111 mmol/L 105   102  CO2 22 - 32 mmol/L 26   25   Calcium 8.9 - 10.3 mg/dL 9.9   9.2   Total Protein 6.5 - 8.1 g/dL 6.8  6.3    Total Bilirubin 0.3 - 1.2 mg/dL 0.5  0.7    Alkaline Phos 38 - 126 U/L 70  74    AST 15 - 41 U/L 19  16    ALT 0 - 44 U/L 15  16     Lipid Panel     Component Value Date/Time   CHOL 116 06/24/2015 0210   TRIG 75 06/24/2015 0210   HDL 40 (L) 06/24/2015 0210   CHOLHDL 2.9 06/24/2015 0210   VLDL 15 06/24/2015 0210   LDLCALC 61 06/24/2015 0210    CBC    Component Value Date/Time   WBC 7.3 12/16/2021 1701   RBC 5.84 (H) 12/16/2021 1701   HGB 17.0 12/16/2021 1701   HCT 51.2 12/16/2021 1701   PLT 257 12/16/2021 1701   MCV 87.7 12/16/2021 1701   MCH 29.1 12/16/2021 1701   MCHC 33.2 12/16/2021 1701   RDW 13.1 12/16/2021 1701   LYMPHSABS 2.9 10/09/2021 0340   MONOABS 0.6 10/09/2021 0340   EOSABS 0.4 10/09/2021 0340   BASOSABS 0.0 10/09/2021 0340    ASSESSMENT AND PLAN:  1. Hypertension associated with diabetes ([facility]CC) Blood pressure is not at goal.  Patient has not been taking lisinopril.  He takes Norvasc but inconsistently.  I recommend that we just take the lisinopril off his med list.  Advised to continue with just the Norvasc.  Low-salt diet encouraged.  2. Chronic cough Patient declines lung cancer screening with low-dose CT scan. Cough has significantly improved with him stopping cigarettes and using Symbicort.  Advised that there are still refills left on the Symbicort.  Encouraged him to try to quit the cigars as well.  3. Tobacco dependence See #2 above. - nicotine polacrilex (NICORETTE) 2 MG gum; Take 1 each (2 mg total) by mouth as  needed for smoking cessation.  Dispense: 100 tablet; Refill: 3  4. Screening for colon cancer Discussed colon cancer screening.  Since colonoscopy is either not covered or too costly on his current insurance, he is agreeable to doing the fit test instead. - Fecal occult blood, imunochemical([facility]/Sun[facility])  5. Lung cancer screening declined by patient   6. Coronary artery disease involving native coronary artery of native heart without angina pectoris Advised patient that given his history of coronary artery disease and diabetes, it is recommended that he take a cholesterol-lowering medication.  He is agreeable to having his cholesterol and liver function test checked today.  Advised to take the Crestor as prescribed. - Lipid panel - Hepatic Function Panel  7. Need for immunization against influenza - Flu Vaccine QUAD 53mo+IM (Fluarix, Fluzone & Alfiuria Quad PF)    Patient was given the opportunity to ask questions.  Patient verbalized understanding of the plan and was able to repeat key elements of the plan.   This documentation was completed using Paediatric nurse.  Any transcriptional errors are unintentional.  Orders Placed This Encounter  Procedures   Fecal occult blood, imunochemical([facility]/Sunquest)   Flu Vaccine QUAD 69mo+IM (Fluarix, Fluzone & Alfiuria Quad PF)   Lipid panel   Hepatic Function Panel     Requested Prescriptions   Signed Prescriptions Disp Refills   nicotine polacrilex (NICORETTE) 2 MG gum 100 tablet 3    Sig: Take 1 each (2 mg total) by mouth as needed for  smoking cessation.    Return in about 4 months (around 08/01/2022).  Jonah Blue, MD, FACP

## 2022-04-01 NOTE — Patient Instructions (Signed)
Stop the lisinopril.  Based on your blood pressure readings, we can get away with having you take just the amlodipine.  I have taken the metformin off your med list. Start the Farxiga 5 mg as prescribed on your last visit for the diabetes.  You should be on aspirin and rosuvastatin given your history of heart disease.  You still have refill on the Symbicort inhaler.  Refill sent on the nicotine gum.

## 2022-04-02 LAB — LIPID PANEL
Chol/HDL Ratio: 4.2 ratio (ref 0.0–5.0)
Cholesterol, Total: 180 mg/dL (ref 100–199)
HDL: 43 mg/dL (ref >39)
LDL Chol Calc (NIH): 114 mg/dL — ABNORMAL HIGH (ref 0–99)
Triglycerides: 128 mg/dL (ref 0–149)
VLDL Cholesterol Cal: 23 mg/dL (ref 5–40)

## 2022-04-02 LAB — HEPATIC FUNCTION PANEL
ALT: 11 IU/L (ref 0–44)
AST: 14 IU/L (ref 0–40)
Albumin: 4.4 g/dL (ref 3.8–4.9)
Alkaline Phosphatase: 98 IU/L (ref 44–121)
Bilirubin Total: 0.5 mg/dL (ref 0.0–1.2)
Bilirubin, Direct: 0.15 mg/dL (ref 0.00–0.40)
Total Protein: 6.8 g/dL (ref 6.0–8.5)

## 2022-04-02 LAB — FECAL OCCULT BLOOD, IMMUNOCHEMICAL: Fecal Occult Bld: NEGATIVE

## 2022-05-24 IMAGING — CR DG CHEST 1V
1 series · 1 of 1 positions shown · non-contrast
Comparison: Chest radiograph dated April 26, 2019

CLINICAL DATA: Chest pain for several days.

EXAM:
CHEST  1 VIEW

[chest pa]
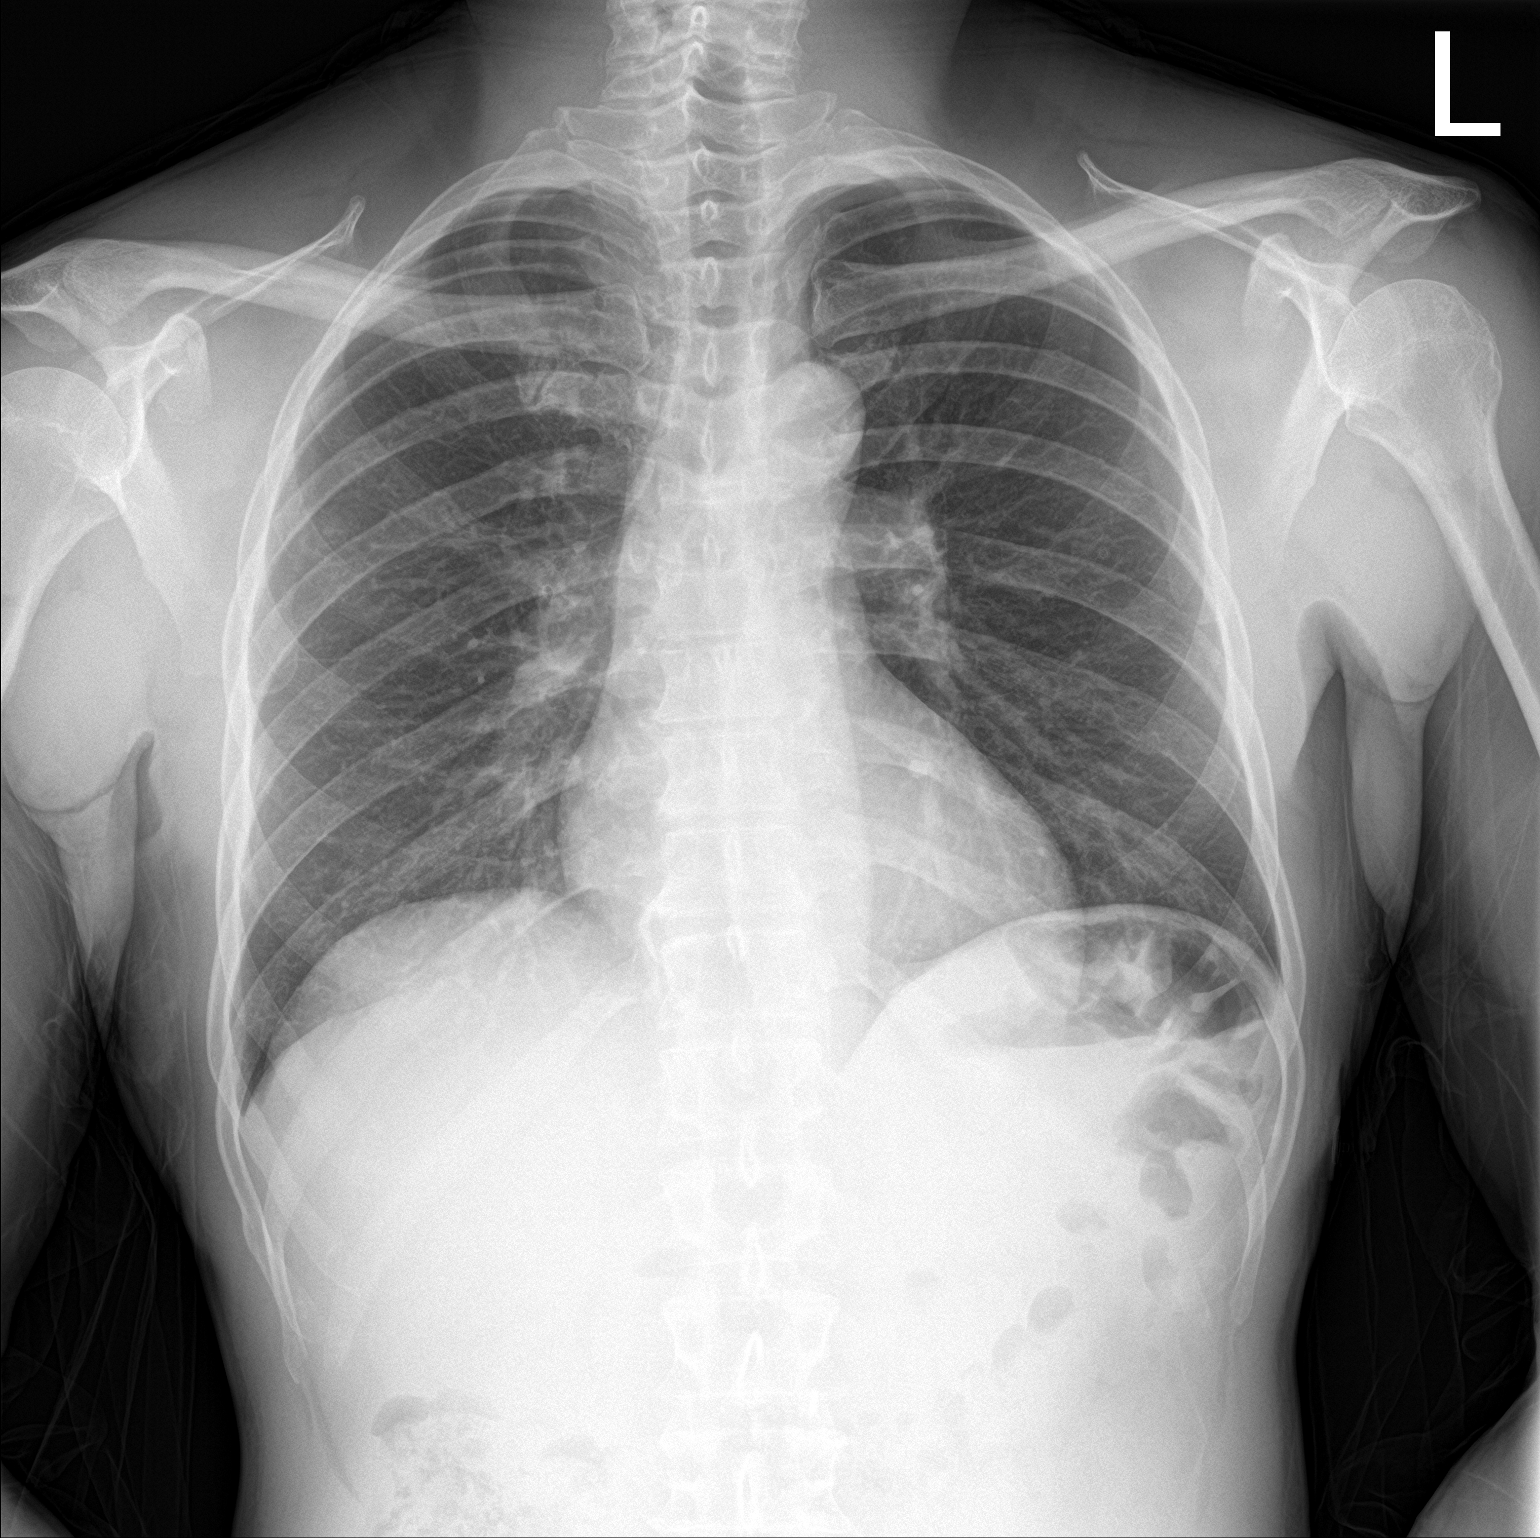

[1 of 1 positions shown; findings below may reference images not displayed]

FINDINGS: The heart size and mediastinal contours are within normal limits.
Both lungs are clear. The visualized skeletal structures are
unremarkable.
IMPRESSION: No active disease.

## 2022-08-02 ENCOUNTER — Ambulatory Visit: Payer: No Typology Code available for payment source | Admitting: Internal Medicine

## 2022-11-06 ENCOUNTER — Emergency Department (HOSPITAL_COMMUNITY)
Admission: EM | Admit: 2022-11-06 | Discharge: 2022-11-06 | Disposition: A | Discharge status: Home / Self Care | Payer: No Typology Code available for payment source | Attending: Emergency Medicine | Admitting: Emergency Medicine

## 2022-11-06 ENCOUNTER — Emergency Department (HOSPITAL_COMMUNITY): Payer: No Typology Code available for payment source

## 2022-11-06 DIAGNOSIS — R112 Nausea with vomiting, unspecified: Secondary | ICD-10-CM

## 2022-11-06 DIAGNOSIS — Z79899 Other long term (current) drug therapy: Secondary | ICD-10-CM | POA: Insufficient documentation

## 2022-11-06 DIAGNOSIS — F17200 Nicotine dependence, unspecified, uncomplicated: Secondary | ICD-10-CM | POA: Diagnosis not present

## 2022-11-06 DIAGNOSIS — I509 Heart failure, unspecified: Secondary | ICD-10-CM | POA: Diagnosis not present

## 2022-11-06 DIAGNOSIS — R059 Cough, unspecified: Secondary | ICD-10-CM | POA: Diagnosis present

## 2022-11-06 DIAGNOSIS — U071 COVID-19: Secondary | ICD-10-CM | POA: Diagnosis not present

## 2022-11-06 DIAGNOSIS — Z7982 Long term (current) use of aspirin: Secondary | ICD-10-CM | POA: Diagnosis not present

## 2022-11-06 DIAGNOSIS — Z7984 Long term (current) use of oral hypoglycemic drugs: Secondary | ICD-10-CM | POA: Diagnosis not present

## 2022-11-06 DIAGNOSIS — R7989 Other specified abnormal findings of blood chemistry: Secondary | ICD-10-CM

## 2022-11-06 DIAGNOSIS — I110 Hypertensive heart disease with heart failure: Secondary | ICD-10-CM | POA: Diagnosis not present

## 2022-11-06 DIAGNOSIS — E119 Type 2 diabetes mellitus without complications: Secondary | ICD-10-CM | POA: Diagnosis not present

## 2022-11-06 DIAGNOSIS — R945 Abnormal results of liver function studies: Secondary | ICD-10-CM | POA: Diagnosis not present

## 2022-11-06 LAB — CBC WITH DIFFERENTIAL/PLATELET
Abs Immature Granulocytes: 0.05 10*3/uL (ref 0.00–0.07)
Basophils Absolute: 0 10*3/uL (ref 0.0–0.1)
Basophils Relative: 1 %
Eosinophils Absolute: 0 10*3/uL (ref 0.0–0.5)
Eosinophils Relative: 1 %
HCT: 49 % (ref 39.0–52.0)
Hemoglobin: 15.4 g/dL (ref 13.0–17.0)
Immature Granulocytes: 1 %
Lymphocytes Relative: 13 %
Lymphs Abs: 0.9 10*3/uL (ref 0.7–4.0)
MCH: 28.7 pg (ref 26.0–34.0)
MCHC: 31.4 g/dL (ref 30.0–36.0)
MCV: 91.2 fL (ref 80.0–100.0)
Monocytes Absolute: 0.7 10*3/uL (ref 0.1–1.0)
Monocytes Relative: 10 %
Neutro Abs: 5.3 10*3/uL (ref 1.7–7.7)
Neutrophils Relative %: 74 %
Platelets: 209 10*3/uL (ref 150–400)
RBC: 5.37 MIL/uL (ref 4.22–5.81)
RDW: 12.7 % (ref 11.5–15.5)
WBC: 7 10*3/uL (ref 4.0–10.5)
nRBC: 0 % (ref 0.0–0.2)

## 2022-11-06 LAB — COMPREHENSIVE METABOLIC PANEL
ALT: 89 U/L — ABNORMAL HIGH (ref 0–44)
AST: 76 U/L — ABNORMAL HIGH (ref 15–41)
Albumin: 3.8 g/dL (ref 3.5–5.0)
Alkaline Phosphatase: 92 U/L (ref 38–126)
Anion gap: 14 (ref 5–15)
BUN: 13 mg/dL (ref 6–20)
CO2: 21 mmol/L — ABNORMAL LOW (ref 22–32)
Calcium: 9.2 mg/dL (ref 8.9–10.3)
Chloride: 100 mmol/L (ref 98–111)
Creatinine, Ser: 1.07 mg/dL (ref 0.61–1.24)
GFR, Estimated: >60 mL/min (ref >60)
Glucose, Bld: 122 mg/dL — ABNORMAL HIGH (ref 70–99)
Potassium: 4.5 mmol/L (ref 3.5–5.1)
Sodium: 135 mmol/L (ref 135–145)
Total Bilirubin: 0.6 mg/dL (ref 0.3–1.2)
Total Protein: 6.1 g/dL — ABNORMAL LOW (ref 6.5–8.1)

## 2022-11-06 LAB — I-STAT CG4 LACTIC ACID, ED: Lactic Acid, Venous: 1.3 mmol/L (ref 0.5–1.9)

## 2022-11-06 LAB — SARS CORONAVIRUS 2 BY RT PCR: SARS Coronavirus 2 by RT PCR: POSITIVE — AB

## 2022-11-06 MED ORDER — ONDANSETRON 4 MG PO TBDP
4.0000 mg | ORAL_TABLET | Freq: Once | ORAL | Status: AC
  Administered 2022-11-06: 4 mg via ORAL
  Filled 2022-11-06: qty 1

## 2022-11-06 MED ORDER — PROMETHAZINE-DM 6.25-15 MG/5ML PO SYRP: 5.0000 mL | ORAL_SOLUTION | Freq: Four times a day (QID) | ORAL | 0 refills | Status: DC | PRN

## 2022-11-06 MED ORDER — BENZONATATE 100 MG PO CAPS
100.0000 mg | ORAL_CAPSULE | Freq: Once | ORAL | Status: AC
  Administered 2022-11-06: 100 mg via ORAL
  Filled 2022-11-06: qty 1

## 2022-11-06 MED ORDER — METOCLOPRAMIDE HCL 5 MG/ML IJ SOLN: 10.0000 mg | Freq: Once | INTRAMUSCULAR | Status: DC

## 2022-11-06 MED ORDER — ACETAMINOPHEN 325 MG PO TABS
650.0000 mg | ORAL_TABLET | Freq: Once | ORAL | Status: AC | PRN
  Administered 2022-11-06: 650 mg via ORAL
  Filled 2022-11-06: qty 2

## 2022-11-06 NOTE — ED Triage Notes (Signed)
Pt expresses having nausea, vomiting, chills, chronic cough, headaches, and weakness that has been going on for 1 day.

## 2022-11-06 NOTE — ED Provider Notes (Signed)
[facility] EMERGENCY DEPARTMENT AT [facility] Provider Note   CSN: [id number] Arrival date & time: 11/06/22  0158     History  Chief Complaint  Patient presents with   Emesis    Jimmy Parker is a 52 y.o. male.  The history is provided by the patient and medical records.  Emesis Jimmy Parker is a 52 y.o. male who presents to the Emergency Department complaining of feeling sick.  He states that he got sick yesterday.  He has symptoms of cough, vomiting, diarrhea, diaphoresis, chills and chest discomfort.  He has central and right-sided abdominal pain.  He reports 7 episodes of vomiting and diarrhea.  Symptoms are moderate and constant in nature.  Hx/o CHF, HTN, DM, HPL Smokes tobacco. Drinks alcohol on the weekends.  No regular tylenol use.      Home Medications Prior to Admission medications   Medication Sig Start Date End Date Taking? Authorizing Provider  promethazine-dextromethorphan (PROMETHAZINE-DM) 6.25-15 MG/5ML syrup Take 5 mLs by mouth 4 (four) times daily as needed for cough. 11/06/22  Yes Tilden Fossa, MD  acetaminophen (TYLENOL) 160 MG/5ML liquid Take 500 mg by mouth 2 (two) times daily.    [provider]  amLODipine (NORVASC) 10 MG tablet Take 1 tablet (10 mg total) by mouth daily. 01/15/22   Marcine Matar, MD  aspirin EC 81 MG tablet Take 1 tablet (81 mg total) by mouth daily. 01/15/22   Marcine Matar, MD  aspirin EC 81 MG tablet Take 1 tablet (81 mg total) by mouth daily. Swallow whole. 01/15/22   Marcine Matar, MD  budesonide-formoterol (SYMBICORT) 80-4.5 MCG/ACT inhaler Inhale 2 puffs into the lungs 2 (two) times daily. 01/15/22   Marcine Matar, MD  dapagliflozin propanediol (FARXIGA) 5 MG TABS tablet Take 1 tablet (5 mg total) by mouth daily before breakfast. 01/15/22   Marcine Matar, MD  nicotine polacrilex (NICORETTE) 2 MG gum Take 1 each (2 mg total) by mouth as needed for smoking cessation. 04/01/22   Marcine Matar, MD  predniSONE (DELTASONE) 20 MG tablet Take 1 tablet (20 mg total) by mouth daily with breakfast. 01/15/22   Marcine Matar, MD  rosuvastatin (CRESTOR) 20 MG tablet Take 1 tablet (20 mg total) by mouth daily. 01/15/22   Marcine Matar, MD      Allergies    Patient has no known allergies.    Review of Systems   Review of Systems  Gastrointestinal:  Positive for vomiting.  All other systems reviewed and are negative.   Physical Exam Updated Vital Signs BP 135/75 (BP Location: Right Arm)   Pulse 90   Temp 99.3 F (37.4 C) (Oral)   Resp (!) 22   SpO2 97%  Physical Exam Vitals and nursing note reviewed.  Constitutional:      Appearance: He is well-developed.  HENT:     Head: Normocephalic and atraumatic.  Cardiovascular:     Rate and Rhythm: Normal rate and regular rhythm.     Heart sounds: No murmur heard. Pulmonary:     Effort: Pulmonary effort is normal. No respiratory distress.     Breath sounds: Normal breath sounds.  Abdominal:     Palpations: Abdomen is soft.     Tenderness: There is no guarding or rebound.     Comments: Mild RUQ tenderness  Musculoskeletal:        General: No swelling or tenderness.  Skin:    General: Skin is warm  and dry.  Neurological:     Mental Status: He is alert and oriented to person, place, and time.  Psychiatric:        Behavior: Behavior normal.     ED Results / Procedures / Treatments   Labs (all labs ordered are listed, but only abnormal results are displayed) Labs Reviewed  SARS CORONAVIRUS 2 BY RT PCR - Abnormal; Notable for the following components:      Result Value   SARS Coronavirus 2 by RT PCR POSITIVE (*)    All other components within normal limits  COMPREHENSIVE METABOLIC PANEL - Abnormal; Notable for the following components:   CO2 21 (*)    Glucose, Bld 122 (*)    Total Protein 6.1 (*)    AST 76 (*)    ALT 89 (*)    All other components within normal limits  CBC WITH DIFFERENTIAL/PLATELET   I-STAT CG4 LACTIC ACID, ED    EKG None  Radiology US Abdomen Limited RUQ (LIVER/GB)  Result Date: 11/06/2022 CLINICAL DATA:  52 year old male with history of right upper quadrant abdominal pain. EXAM: ULTRASOUND ABDOMEN LIMITED RIGHT UPPER QUADRANT COMPARISON:  No priors. FINDINGS: Gallbladder: No gallstones or wall thickening visualized. No sonographic Murphy sign noted by sonographer. Common bile duct: Diameter: 4.4 Liver: No focal lesion identified. Mildly nodular liver contour. Hepatic echogenicity is diffusely mildly increased. Portal vein is patent on color Doppler imaging with normal direction of blood flow towards the liver. Other: None. IMPRESSION: 1. No acute findings to account for the patient's symptoms. Specifically, no gallstones or findings to suggest an acute cholecystitis at this time. 2. Mild diffuse increased echogenicity throughout the hepatic parenchyma, suggesting a background of hepatic steatosis. Mild nodularity of the liver contour also suggest developing cirrhosis. Electronically Signed   By: Trudie Reed M.D.   On: 11/06/2022 05:38   DG Chest 2 View  Result Date: 11/06/2022 CLINICAL DATA:  Nausea, vomiting, chills, chronic cough, headache and weakness x1 day. EXAM: CHEST - 2 VIEW COMPARISON:  December 16, 2021 FINDINGS: The cardiac silhouette is mildly enlarged. Very mildly increased bibasilar lung markings are seen. There is no evidence of an acute infiltrate, pleural effusion or pneumothorax. The visualized skeletal structures are unremarkable. IMPRESSION: No acute cardiopulmonary disease. Electronically Signed   By: Aram Candela M.D.   On: 11/06/2022 04:42    Procedures Procedures    Medications Ordered in ED Medications  acetaminophen (TYLENOL) tablet 650 mg (650 mg Oral Given 11/06/22 0214)  benzonatate (TESSALON) capsule 100 mg (100 mg Oral Given 11/06/22 0519)  ondansetron (ZOFRAN-ODT) disintegrating tablet 4 mg (4 mg Oral Given 11/06/22 4098)    ED  Course/ Medical Decision Making/ A&P                             Medical Decision Making Amount and/or Complexity of Data Reviewed Labs: ordered. Radiology: ordered.  Risk OTC drugs. Prescription drug management.   Patient here for evaluation of cough, body aches, vomiting, diarrhea, abdominal pain.  He is nontoxic-appearing on evaluation with no respiratory distress.  He was febrile on ED presentation.  He does have some right upper quadrant tenderness without peritoneal findings.  Ultrasound is negative for cholecystitis.  He has mild elevation in his transaminases.  He is positive for COVID-19.  Chest x-ray is negative for acute disease-images personally reviewed and interpreted, agree with radiologist interpretation.  He was treated with acetaminophen for his fever as well  as Tessalon for cough and ondansetron for nausea.  He is able to tolerate p.o. in the emergency department without difficulty.  Discussed with patient risks and benefits of Paxlovid.  Patient declines antiviral treatment.  Discussed findings of COVID-19.  Discussed home care and symptom management.  Discussed PCP follow-up regarding his liver enzymes and ultrasound.  Discussed return precautions.  Current clinical picture is not consistent with sepsis, pneumonia, PE, appendicitis, cholecystitis.        Final Clinical Impression(s) / ED Diagnoses Final diagnoses:  COVID-19 virus infection  Nausea vomiting and diarrhea  Elevated LFTs    Rx / DC Orders ED Discharge Orders          Ordered    promethazine-dextromethorphan (PROMETHAZINE-DM) 6.25-15 MG/5ML syrup  4 times daily PRN        11/06/22 0602              Tilden Fossa, MD 11/06/22 [phone]

## 2022-11-06 NOTE — Discharge Instructions (Addendum)
Your ultrasound today showed some changes to your liver.  Please follow-up with your family doctor for further evaluation.  Get rechecked sooner if you have new or concerning symptoms.

## 2023-02-17 ENCOUNTER — Emergency Department (HOSPITAL_COMMUNITY): Payer: No Typology Code available for payment source

## 2023-02-17 ENCOUNTER — Encounter (HOSPITAL_COMMUNITY): Payer: Self-pay

## 2023-02-17 ENCOUNTER — Other Ambulatory Visit: Payer: Self-pay

## 2023-02-17 ENCOUNTER — Inpatient Hospital Stay (HOSPITAL_COMMUNITY)
Admission: EM | Admit: 2023-02-17 | Discharge: 2023-02-21 | DRG: 321 | Disposition: A | Discharge status: Home / Self Care | Payer: No Typology Code available for payment source | Attending: Internal Medicine | Admitting: Internal Medicine

## 2023-02-17 DIAGNOSIS — E119 Type 2 diabetes mellitus without complications: Secondary | ICD-10-CM

## 2023-02-17 DIAGNOSIS — R7989 Other specified abnormal findings of blood chemistry: Secondary | ICD-10-CM

## 2023-02-17 DIAGNOSIS — F17210 Nicotine dependence, cigarettes, uncomplicated: Secondary | ICD-10-CM | POA: Diagnosis present

## 2023-02-17 DIAGNOSIS — K769 Liver disease, unspecified: Secondary | ICD-10-CM | POA: Diagnosis present

## 2023-02-17 DIAGNOSIS — I214 Non-ST elevation (NSTEMI) myocardial infarction: Principal | ICD-10-CM | POA: Diagnosis present

## 2023-02-17 DIAGNOSIS — R1010 Upper abdominal pain, unspecified: Secondary | ICD-10-CM

## 2023-02-17 DIAGNOSIS — Z7984 Long term (current) use of oral hypoglycemic drugs: Secondary | ICD-10-CM

## 2023-02-17 DIAGNOSIS — R109 Unspecified abdominal pain: Secondary | ICD-10-CM | POA: Diagnosis not present

## 2023-02-17 DIAGNOSIS — R001 Bradycardia, unspecified: Secondary | ICD-10-CM | POA: Diagnosis present

## 2023-02-17 DIAGNOSIS — I252 Old myocardial infarction: Secondary | ICD-10-CM

## 2023-02-17 DIAGNOSIS — Z7952 Long term (current) use of systemic steroids: Secondary | ICD-10-CM

## 2023-02-17 DIAGNOSIS — I2510 Atherosclerotic heart disease of native coronary artery without angina pectoris: Secondary | ICD-10-CM | POA: Diagnosis present

## 2023-02-17 DIAGNOSIS — E785 Hyperlipidemia, unspecified: Secondary | ICD-10-CM | POA: Diagnosis present

## 2023-02-17 DIAGNOSIS — Z79899 Other long term (current) drug therapy: Secondary | ICD-10-CM

## 2023-02-17 DIAGNOSIS — Z1152 Encounter for screening for COVID-19: Secondary | ICD-10-CM

## 2023-02-17 DIAGNOSIS — Z7982 Long term (current) use of aspirin: Secondary | ICD-10-CM

## 2023-02-17 DIAGNOSIS — Z7951 Long term (current) use of inhaled steroids: Secondary | ICD-10-CM

## 2023-02-17 DIAGNOSIS — R188 Other ascites: Secondary | ICD-10-CM | POA: Diagnosis present

## 2023-02-17 DIAGNOSIS — I10 Essential (primary) hypertension: Secondary | ICD-10-CM | POA: Diagnosis present

## 2023-02-17 DIAGNOSIS — Z720 Tobacco use: Secondary | ICD-10-CM | POA: Diagnosis present

## 2023-02-17 DIAGNOSIS — I255 Ischemic cardiomyopathy: Secondary | ICD-10-CM | POA: Diagnosis present

## 2023-02-17 DIAGNOSIS — I110 Hypertensive heart disease with heart failure: Secondary | ICD-10-CM | POA: Diagnosis present

## 2023-02-17 DIAGNOSIS — Z955 Presence of coronary angioplasty implant and graft: Secondary | ICD-10-CM

## 2023-02-17 DIAGNOSIS — Z8249 Family history of ischemic heart disease and other diseases of the circulatory system: Secondary | ICD-10-CM

## 2023-02-17 DIAGNOSIS — I5023 Acute on chronic systolic (congestive) heart failure: Secondary | ICD-10-CM

## 2023-02-17 LAB — COMPREHENSIVE METABOLIC PANEL
ALT: 179 U/L — ABNORMAL HIGH (ref 0–44)
AST: 93 U/L — ABNORMAL HIGH (ref 15–41)
Albumin: 3.2 g/dL — ABNORMAL LOW (ref 3.5–5.0)
Alkaline Phosphatase: 78 U/L (ref 38–126)
Anion gap: 9 (ref 5–15)
BUN: 16 mg/dL (ref 6–20)
CO2: 26 mmol/L (ref 22–32)
Calcium: 8.7 mg/dL — ABNORMAL LOW (ref 8.9–10.3)
Chloride: 101 mmol/L (ref 98–111)
Creatinine, Ser: 1.1 mg/dL (ref 0.61–1.24)
GFR, Estimated: >60 mL/min (ref >60)
Glucose, Bld: 138 mg/dL — ABNORMAL HIGH (ref 70–99)
Potassium: 3.8 mmol/L (ref 3.5–5.1)
Sodium: 136 mmol/L (ref 135–145)
Total Bilirubin: 1.6 mg/dL — ABNORMAL HIGH (ref <1.2)
Total Protein: 5.3 g/dL — ABNORMAL LOW (ref 6.5–8.1)

## 2023-02-17 LAB — CBC WITH DIFFERENTIAL/PLATELET
Abs Immature Granulocytes: 0.04 10*3/uL (ref 0.00–0.07)
Basophils Absolute: 0 10*3/uL (ref 0.0–0.1)
Basophils Relative: 1 %
Eosinophils Absolute: 0.1 10*3/uL (ref 0.0–0.5)
Eosinophils Relative: 1 %
HCT: 46.8 % (ref 39.0–52.0)
Hemoglobin: 14.9 g/dL (ref 13.0–17.0)
Immature Granulocytes: 1 %
Lymphocytes Relative: 22 %
Lymphs Abs: 1.4 10*3/uL (ref 0.7–4.0)
MCH: 28.8 pg (ref 26.0–34.0)
MCHC: 31.8 g/dL (ref 30.0–36.0)
MCV: 90.3 fL (ref 80.0–100.0)
Monocytes Absolute: 0.7 10*3/uL (ref 0.1–1.0)
Monocytes Relative: 11 %
Neutro Abs: 4.1 10*3/uL (ref 1.7–7.7)
Neutrophils Relative %: 64 %
Platelets: 179 10*3/uL (ref 150–400)
RBC: 5.18 MIL/uL (ref 4.22–5.81)
RDW: 13.5 % (ref 11.5–15.5)
WBC: 6.5 10*3/uL (ref 4.0–10.5)
nRBC: 0 % (ref 0.0–0.2)

## 2023-02-17 LAB — URINALYSIS, ROUTINE W REFLEX MICROSCOPIC
Bilirubin Urine: NEGATIVE
Glucose, UA: 150 mg/dL — AB
Hgb urine dipstick: NEGATIVE
Ketones, ur: NEGATIVE mg/dL
Leukocytes,Ua: NEGATIVE
Nitrite: NEGATIVE
Protein, ur: NEGATIVE mg/dL
Specific Gravity, Urine: 1.019 (ref 1.005–1.030)
pH: 5 (ref 5.0–8.0)

## 2023-02-17 LAB — LIPASE, BLOOD: Lipase: 44 U/L (ref 11–51)

## 2023-02-17 LAB — RESP PANEL BY RT-PCR (RSV, FLU A&B, COVID)  RVPGX2
Influenza A by PCR: NEGATIVE
Influenza B by PCR: NEGATIVE
Resp Syncytial Virus by PCR: NEGATIVE
SARS Coronavirus 2 by RT PCR: NEGATIVE

## 2023-02-17 LAB — TROPONIN I (HIGH SENSITIVITY): Troponin I (High Sensitivity): 2740 ng/L (ref <18)

## 2023-02-17 MED ORDER — HEPARIN BOLUS VIA INFUSION
3500.0000 [IU] | Freq: Once | INTRAVENOUS | Status: AC
  Administered 2023-02-17: 3500 [IU] via INTRAVENOUS
  Filled 2023-02-17: qty 3500

## 2023-02-17 MED ORDER — HEPARIN (PORCINE) 25000 UT/250ML-% IV SOLN
1050.0000 [IU]/h | INTRAVENOUS | Status: DC
  Administered 2023-02-17: 800 [IU]/h via INTRAVENOUS
  Filled 2023-02-17: qty 250

## 2023-02-17 MED ORDER — ASPIRIN 81 MG PO CHEW
324.0000 mg | CHEWABLE_TABLET | Freq: Once | ORAL | Status: AC
  Administered 2023-02-17: 324 mg via ORAL
  Filled 2023-02-17: qty 4

## 2023-02-17 NOTE — Progress Notes (Signed)
PHARMACY - ANTICOAGULATION CONSULT NOTE  Pharmacy Consult for heparin infusion Indication: chest pain/ACS  No Known Allergies  Patient Measurements: Height: 5\' 7"  (170.2 cm) Weight: 63.5 kg (140 lb) IBW/kg (Calculated) : 66.1 Heparin Dosing Weight: 63.5 kg  Vital Signs: Temp: 99.9 F (37.7 C) (11/07 2217) Temp Source: Oral (11/07 2217) BP: 118/60 (11/07 2215) Pulse Rate: 50 (11/07 2215)  Labs: Recent Labs    02/17/23 1745 02/17/23 2209  HGB 14.9  --   HCT 46.8  --   PLT 179  --   CREATININE 1.10  --   TROPONINIHS  --  2,740*    Estimated Creatinine Clearance: 69.8 mL/min (by C-G formula based on SCr of 1.1 mg/dL).   Medical History: Past Medical History:  Diagnosis Date   CAD (coronary artery disease)    a. 07/2013 Neg MV;  b. 06/2015 NSTEMI/Cath:LM nl, LAD 86m,  D1/2/3 nl, RI mod/nl, LCX 64m, OM1 small, OM2 nl, RCA 20p, 182m, RPDA fills via collats from OM3, EF 35-45%.   CAD- occluded RCA with collaterals 06/24/2015   Chest pain 08/03/2013   CHF (congestive heart failure) (HCC)    Diabetes mellitus (HCC) 05/28/2008   Qualifier: Diagnosis of  By: Levon Hedger     Diet-controlled diabetes mellitus (HCC)    Dyslipidemia 06/23/2015   DYSMETABOLIC SYNDROME 08/13/2008   Qualifier: Diagnosis of  By: Daphine Deutscher FNP, [place]     Heart attack [facility])    Hyperlipidemia    Hypertension    Hypertensive heart disease    Ischemic cardiomyopathy    a. 07/2013 Echo: EF 50-55%, no rwma, Gr1 DD;  b. 06/2015 EF 35-45% by Vgram.    NSTEMI (non-ST elevated myocardial infarction) (HCC) 06/23/2015   Tobacco abuse    a. quit 06/2015.   TOBACCO ABUSE 08/05/2009   Qualifier: Diagnosis of  By: Levon Hedger      Assessment: 82ONG admitted for chest pain, found with troponin to 2740. Pharmacy consulted to initiate heparin infusion. Hgb WNL 14.9, no dispense history for anticoagulation.   Goal of Therapy:  Heparin level 0.3-0.7 units/ml Monitor platelets by anticoagulation protocol: Yes    Plan:  Give 3500 units bolus x 1 Start heparin infusion at 800 units/hr Check anti-Xa level in 6 hours and daily while on heparin Continue to monitor H&H and platelets  Rutherford Nail, PharmD PGY2 Critical Care Pharmacy Resident 02/17/2023,11:12 PM

## 2023-02-17 NOTE — ED Notes (Signed)
Went to Ultrasound.

## 2023-02-17 NOTE — ED Provider Notes (Signed)
EMERGENCY DEPARTMENT AT [facility] Provider Note   CSN: [id number] Arrival date & time: 02/17/23  1715     History  Chief Complaint  Patient presents with   Abdominal Pain    Jimmy Parker is a 52 y.o. male.  Pt c/o pain to upper abdomen for past three days. Symptoms at rest, dull, non radiating, without specific exacerbating or alleviating factors. Constant. No fever or chills. No vomiting or diarrhea. No abd distension. No constipation. No hx pud, gallstones, or pancreatitis. No fever or chills.  The history is provided by the patient and medical records.  Abdominal Pain Associated symptoms: cough   Associated symptoms: no chest pain, no diarrhea, no dysuria, no fever, no shortness of breath, no sore throat and no vomiting        Home Medications Prior to Admission medications   Medication Sig Start Date End Date Taking? Authorizing Provider  acetaminophen (TYLENOL) 160 MG/5ML liquid Take 500 mg by mouth 2 (two) times daily.    [provider]  amLODipine (NORVASC) 10 MG tablet Take 1 tablet (10 mg total) by mouth daily. 01/15/22   Marcine Matar, MD  aspirin EC 81 MG tablet Take 1 tablet (81 mg total) by mouth daily. 01/15/22   Marcine Matar, MD  aspirin EC 81 MG tablet Take 1 tablet (81 mg total) by mouth daily. Swallow whole. 01/15/22   Marcine Matar, MD  budesonide-formoterol (SYMBICORT) 80-4.5 MCG/ACT inhaler Inhale 2 puffs into the lungs 2 (two) times daily. 01/15/22   Marcine Matar, MD  dapagliflozin propanediol (FARXIGA) 5 MG TABS tablet Take 1 tablet (5 mg total) by mouth daily before breakfast. 01/15/22   Marcine Matar, MD  nicotine polacrilex (NICORETTE) 2 MG gum Take 1 each (2 mg total) by mouth as needed for smoking cessation. 04/01/22   Marcine Matar, MD  predniSONE (DELTASONE) 20 MG tablet Take 1 tablet (20 mg total) by mouth daily with breakfast. 01/15/22   Marcine Matar, MD   promethazine-dextromethorphan (PROMETHAZINE-DM) 6.25-15 MG/5ML syrup Take 5 mLs by mouth 4 (four) times daily as needed for cough. 11/06/22   Tilden Fossa, MD  rosuvastatin (CRESTOR) 20 MG tablet Take 1 tablet (20 mg total) by mouth daily. 01/15/22   Marcine Matar, MD      Allergies    Patient has no known allergies.    Review of Systems   Review of Systems  Constitutional:  Negative for fever.  HENT:  Positive for congestion. Negative for sore throat.   Eyes:  Negative for redness.  Respiratory:  Positive for cough. Negative for shortness of breath.   Cardiovascular:  Negative for chest pain and leg swelling.  Gastrointestinal:  Positive for abdominal pain. Negative for diarrhea and vomiting.  Genitourinary:  Negative for dysuria and flank pain.  Musculoskeletal:  Negative for back pain.  Skin:  Negative for rash.  Neurological:  Negative for headaches.    Physical Exam Updated Vital Signs BP 118/60   Pulse (!) 50   Temp 99.9 F (37.7 C) (Oral)   Resp (!) 21   Ht 1.702 m (5\' 7" )   Wt 63.5 kg   SpO2 100%   BMI 21.93 kg/m  Physical Exam Vitals and nursing note reviewed.  Constitutional:      Appearance: Normal appearance. He is well-developed.  HENT:     Head: Atraumatic.     Nose: Congestion and rhinorrhea present.     Mouth/Throat:  Mouth: Mucous membranes are moist.     Pharynx: Oropharynx is clear.  Eyes:     General: No scleral icterus.    Conjunctiva/sclera: Conjunctivae normal.  Neck:     Trachea: No tracheal deviation.  Cardiovascular:     Rate and Rhythm: Normal rate and regular rhythm.     Pulses: Normal pulses.     Heart sounds: Normal heart sounds. No murmur heard.    No friction rub. No gallop.  Pulmonary:     Effort: Pulmonary effort is normal. No accessory muscle usage or respiratory distress.     Breath sounds: Normal breath sounds.  Abdominal:     General: Bowel sounds are normal. There is no distension.     Palpations: Abdomen is  soft. There is no mass.     Tenderness: There is abdominal tenderness. There is no guarding or rebound.     Hernia: No hernia is present.     Comments: Upper abd tenderness.   Genitourinary:    Comments: No cva tenderness. Musculoskeletal:        General: No swelling or tenderness.     Cervical back: Normal range of motion and neck supple. No rigidity.     Right lower leg: No edema.     Left lower leg: No edema.  Skin:    General: Skin is warm and dry.     Findings: No rash.  Neurological:     Mental Status: He is alert.     Comments: Alert, speech clear.   Psychiatric:        Mood and Affect: Mood normal.     ED Results / Procedures / Treatments   Labs (all labs ordered are listed, but only abnormal results are displayed) Results for orders placed or performed during the hospital encounter of 02/17/23  Resp panel by RT-PCR (RSV, Flu A&B, Covid) Anterior Nasal Swab   Specimen: Anterior Nasal Swab  Result Value Ref Range   SARS Coronavirus 2 by RT PCR NEGATIVE NEGATIVE   Influenza A by PCR NEGATIVE NEGATIVE   Influenza B by PCR NEGATIVE NEGATIVE   Resp Syncytial Virus by PCR NEGATIVE NEGATIVE  Comprehensive metabolic panel  Result Value Ref Range   Sodium 136 135 - 145 mmol/L   Potassium 3.8 3.5 - 5.1 mmol/L   Chloride 101 98 - 111 mmol/L   CO2 26 22 - 32 mmol/L   Glucose, Bld 138 (H) 70 - 99 mg/dL   BUN 16 6 - 20 mg/dL   Creatinine, Ser 9.60 0.61 - 1.24 mg/dL   Calcium 8.7 (L) 8.9 - 10.3 mg/dL   Total Protein 5.3 (L) 6.5 - 8.1 g/dL   Albumin 3.2 (L) 3.5 - 5.0 g/dL   AST 93 (H) 15 - 41 U/L   ALT 179 (H) 0 - 44 U/L   Alkaline Phosphatase 78 38 - 126 U/L   Total Bilirubin 1.6 (H) <1.2 mg/dL   GFR, Estimated >45 >40 mL/min   Anion gap 9 5 - 15  Lipase, blood  Result Value Ref Range   Lipase 44 11 - 51 U/L  CBC with Diff  Result Value Ref Range   WBC 6.5 4.0 - 10.5 K/uL   RBC 5.18 4.22 - 5.81 MIL/uL   Hemoglobin 14.9 13.0 - 17.0 g/dL   HCT 98.1 19.1 - 47.8 %    MCV 90.3 80.0 - 100.0 fL   MCH 28.8 26.0 - 34.0 pg   MCHC 31.8 30.0 - 36.0 g/dL   RDW  13.5 11.5 - 15.5 %   Platelets 179 150 - 400 K/uL   nRBC 0.0 0.0 - 0.2 %   Neutrophils Relative % 64 %   Neutro Abs 4.1 1.7 - 7.7 K/uL   Lymphocytes Relative 22 %   Lymphs Abs 1.4 0.7 - 4.0 K/uL   Monocytes Relative 11 %   Monocytes Absolute 0.7 0.1 - 1.0 K/uL   Eosinophils Relative 1 %   Eosinophils Absolute 0.1 0.0 - 0.5 K/uL   Basophils Relative 1 %   Basophils Absolute 0.0 0.0 - 0.1 K/uL   Immature Granulocytes 1 %   Abs Immature Granulocytes 0.04 0.00 - 0.07 K/uL  Urinalysis, Routine w reflex microscopic -Urine, Clean Catch  Result Value Ref Range   Color, Urine YELLOW YELLOW   APPearance CLEAR CLEAR   Specific Gravity, Urine 1.019 1.005 - 1.030   pH 5.0 5.0 - 8.0   Glucose, UA 150 (A) NEGATIVE mg/dL   Hgb urine dipstick NEGATIVE NEGATIVE   Bilirubin Urine NEGATIVE NEGATIVE   Ketones, ur NEGATIVE NEGATIVE mg/dL   Protein, ur NEGATIVE NEGATIVE mg/dL   Nitrite NEGATIVE NEGATIVE   Leukocytes,Ua NEGATIVE NEGATIVE  Troponin I (High Sensitivity)  Result Value Ref Range   Troponin I (High Sensitivity) 2,740 (HH) <18 ng/L      EKG EKG Interpretation Date/Time:  Thursday February 17 2023 17:10:26 EST Ventricular Rate:  59 PR Interval:    QRS Duration:  88 QT Interval:  452 QTC Calculation: 447 R Axis:   46  Text Interpretation: Sinus rhythm Non-specific ST-t changes Confirmed by Cathren Laine ([id number]) on 02/17/2023 9:55:21 PM  Radiology No results found.  Procedures Procedures    Medications Ordered in ED Medications  heparin bolus via infusion 3,500 Units (has no administration in time range)  heparin ADULT infusion 100 units/mL (25000 units/2110mL) (has no administration in time range)  aspirin chewable tablet 324 mg (324 mg Oral Given 02/17/23 2324)    ED Course/ Medical Decision Making/ A&P                                 Medical Decision Making Problems  Addressed: Elevated liver function tests: acute illness or injury NSTEMI (non-ST elevated myocardial infarction) [facility]): acute illness or injury with systemic symptoms that poses a threat to life or bodily functions Upper abdominal pain: acute illness or injury with systemic symptoms that poses a threat to life or bodily functions  Amount and/or Complexity of Data Reviewed External Data Reviewed: notes. Labs: ordered. Decision-making details documented in ED Course. Radiology: ordered and independent interpretation performed. Decision-making details documented in ED Course. ECG/medicine tests: ordered and independent interpretation performed. Decision-making details documented in ED Course. Discussion of management or test interpretation with external provider(s): cardiology  Risk OTC drugs. Prescription drug management. Decision regarding hospitalization.   Iv ns. Continuous pulse ox and cardiac monitoring. Labs ordered/sent. Imaging ordered.   Differential diagnosis includes  . Dispo decision including potential need for admission considered - will get labs and imaging and reassess.   Reviewed nursing notes and prior charts for additional history. External reports reviewed.   Cardiac monitor: sinus rhythm, rate 66.  Labs reviewed/interpreted by me - wbc and hgb normal. Ua neg. Chem w mild elev lfts compared to prior. U/s pending.   U/S reviewed/interpreted by me - pnd.   Additional labs reviewed/interpreted by me - trop is v high, c/w nstemi. Asa and heparin. Cardiology consulted  for admission.   CRITICAL CARE Re: NSTEMI Performed by: Suzi Roots Total critical care time: 45 minutes Critical care time was exclusive of separately billable procedures and treating other patients. Critical care was necessary to treat or prevent imminent or life-threatening deterioration. Critical care was time spent personally by me on the following activities: development of treatment plan with  patient and/or surrogate as well as nursing, discussions with consultants, evaluation of patient's response to treatment, examination of patient, obtaining history from patient or surrogate, ordering and performing treatments and interventions, ordering and review of laboratory studies, ordering and review of radiographic studies, pulse oximetry and re-evaluation of patient's condition.          Final Clinical Impression(s) / ED Diagnoses Final diagnoses:  None    Rx / DC Orders ED Discharge Orders     None         Cathren Laine, MD 02/17/23 2328

## 2023-02-17 NOTE — ED Triage Notes (Signed)
Pt began having abd pain Monday  night. Pt took some kind of medication that did not help. Took two tums today with no improvement. Today pt began having diarrhea. Pain is 8/10. Denies urinary symptoms

## 2023-02-18 ENCOUNTER — Encounter (HOSPITAL_COMMUNITY)
Admission: EM | Disposition: A | Discharge status: Home / Self Care | Payer: Self-pay | Source: Home / Self Care | Attending: Internal Medicine

## 2023-02-18 ENCOUNTER — Inpatient Hospital Stay (HOSPITAL_COMMUNITY): Payer: No Typology Code available for payment source

## 2023-02-18 DIAGNOSIS — I214 Non-ST elevation (NSTEMI) myocardial infarction: Secondary | ICD-10-CM

## 2023-02-18 DIAGNOSIS — Z79899 Other long term (current) drug therapy: Secondary | ICD-10-CM | POA: Diagnosis not present

## 2023-02-18 DIAGNOSIS — R7989 Other specified abnormal findings of blood chemistry: Secondary | ICD-10-CM | POA: Diagnosis not present

## 2023-02-18 DIAGNOSIS — I2510 Atherosclerotic heart disease of native coronary artery without angina pectoris: Secondary | ICD-10-CM

## 2023-02-18 DIAGNOSIS — I255 Ischemic cardiomyopathy: Secondary | ICD-10-CM | POA: Diagnosis present

## 2023-02-18 DIAGNOSIS — I110 Hypertensive heart disease with heart failure: Secondary | ICD-10-CM | POA: Diagnosis present

## 2023-02-18 DIAGNOSIS — R109 Unspecified abdominal pain: Secondary | ICD-10-CM | POA: Diagnosis present

## 2023-02-18 DIAGNOSIS — E119 Type 2 diabetes mellitus without complications: Secondary | ICD-10-CM | POA: Diagnosis present

## 2023-02-18 DIAGNOSIS — Z955 Presence of coronary angioplasty implant and graft: Secondary | ICD-10-CM | POA: Diagnosis not present

## 2023-02-18 DIAGNOSIS — I252 Old myocardial infarction: Secondary | ICD-10-CM | POA: Diagnosis not present

## 2023-02-18 DIAGNOSIS — Z7984 Long term (current) use of oral hypoglycemic drugs: Secondary | ICD-10-CM | POA: Diagnosis not present

## 2023-02-18 DIAGNOSIS — R188 Other ascites: Secondary | ICD-10-CM | POA: Diagnosis present

## 2023-02-18 DIAGNOSIS — Z7951 Long term (current) use of inhaled steroids: Secondary | ICD-10-CM | POA: Diagnosis not present

## 2023-02-18 DIAGNOSIS — Z7952 Long term (current) use of systemic steroids: Secondary | ICD-10-CM | POA: Diagnosis not present

## 2023-02-18 DIAGNOSIS — Z8249 Family history of ischemic heart disease and other diseases of the circulatory system: Secondary | ICD-10-CM | POA: Diagnosis not present

## 2023-02-18 DIAGNOSIS — I5023 Acute on chronic systolic (congestive) heart failure: Secondary | ICD-10-CM | POA: Diagnosis present

## 2023-02-18 DIAGNOSIS — R001 Bradycardia, unspecified: Secondary | ICD-10-CM | POA: Diagnosis present

## 2023-02-18 DIAGNOSIS — K769 Liver disease, unspecified: Secondary | ICD-10-CM | POA: Diagnosis present

## 2023-02-18 DIAGNOSIS — Z7982 Long term (current) use of aspirin: Secondary | ICD-10-CM | POA: Diagnosis not present

## 2023-02-18 DIAGNOSIS — Z1152 Encounter for screening for COVID-19: Secondary | ICD-10-CM | POA: Diagnosis not present

## 2023-02-18 DIAGNOSIS — F17210 Nicotine dependence, cigarettes, uncomplicated: Secondary | ICD-10-CM | POA: Diagnosis present

## 2023-02-18 DIAGNOSIS — E785 Hyperlipidemia, unspecified: Secondary | ICD-10-CM | POA: Diagnosis present

## 2023-02-18 HISTORY — PX: RIGHT/LEFT HEART CATH AND CORONARY ANGIOGRAPHY: CATH118266

## 2023-02-18 HISTORY — PX: CORONARY STENT INTERVENTION: CATH118234

## 2023-02-18 LAB — ECHOCARDIOGRAM COMPLETE
AR max vel: 1.83 cm2
AV Area VTI: 1.71 cm2
AV Area mean vel: 1.45 cm2
AV Mean grad: 3 mm[Hg]
AV Peak grad: 5.7 mm[Hg]
Ao pk vel: 1.19 m/s
Area-P 1/2: 4.06 cm2
Calc EF: 36 %
Height: 67 in
S' Lateral: 4.3 cm
Single Plane A2C EF: 33.8 %
Single Plane A4C EF: 36.5 %
Weight: 2240 [oz_av]

## 2023-02-18 LAB — CBC
HCT: 44.1 % (ref 39.0–52.0)
Hemoglobin: 14.1 g/dL (ref 13.0–17.0)
MCH: 28.4 pg (ref 26.0–34.0)
MCHC: 32 g/dL (ref 30.0–36.0)
MCV: 88.9 fL (ref 80.0–100.0)
Platelets: 174 10*3/uL (ref 150–400)
RBC: 4.96 MIL/uL (ref 4.22–5.81)
RDW: 13.7 % (ref 11.5–15.5)
WBC: 7.5 10*3/uL (ref 4.0–10.5)
nRBC: 0 % (ref 0.0–0.2)

## 2023-02-18 LAB — POCT I-STAT EG7
Acid-Base Excess: 1 mmol/L (ref 0.0–2.0)
Acid-Base Excess: 1 mmol/L (ref 0.0–2.0)
Bicarbonate: 26.5 mmol/L (ref 20.0–28.0)
Bicarbonate: 26.6 mmol/L (ref 20.0–28.0)
Calcium, Ion: 1.15 mmol/L (ref 1.15–1.40)
Calcium, Ion: 1.16 mmol/L (ref 1.15–1.40)
HCT: 46 % (ref 39.0–52.0)
HCT: 46 % (ref 39.0–52.0)
Hemoglobin: 15.6 g/dL (ref 13.0–17.0)
Hemoglobin: 15.6 g/dL (ref 13.0–17.0)
O2 Saturation: 60 %
O2 Saturation: 62 %
Potassium: 3.6 mmol/L (ref 3.5–5.1)
Potassium: 3.6 mmol/L (ref 3.5–5.1)
Sodium: 138 mmol/L (ref 135–145)
Sodium: 138 mmol/L (ref 135–145)
TCO2: 28 mmol/L (ref 22–32)
TCO2: 28 mmol/L (ref 22–32)
pCO2, Ven: 42.8 mm[Hg] — ABNORMAL LOW (ref 44–60)
pCO2, Ven: 43.2 mm[Hg] — ABNORMAL LOW (ref 44–60)
pH, Ven: 7.398 (ref 7.25–7.43)
pH, Ven: 7.399 (ref 7.25–7.43)
pO2, Ven: 32 mm[Hg] (ref 32–45)
pO2, Ven: 32 mm[Hg] (ref 32–45)

## 2023-02-18 LAB — POCT I-STAT 7, (LYTES, BLD GAS, ICA,H+H)
Acid-Base Excess: 0 mmol/L (ref 0.0–2.0)
Bicarbonate: 24.2 mmol/L (ref 20.0–28.0)
Calcium, Ion: 1.12 mmol/L — ABNORMAL LOW (ref 1.15–1.40)
HCT: 46 % (ref 39.0–52.0)
Hemoglobin: 15.6 g/dL (ref 13.0–17.0)
O2 Saturation: 93 %
Potassium: 3.5 mmol/L (ref 3.5–5.1)
Sodium: 138 mmol/L (ref 135–145)
TCO2: 25 mmol/L (ref 22–32)
pCO2 arterial: 36.8 mm[Hg] (ref 32–48)
pH, Arterial: 7.426 (ref 7.35–7.45)
pO2, Arterial: 66 mm[Hg] — ABNORMAL LOW (ref 83–108)

## 2023-02-18 LAB — HEPARIN LEVEL (UNFRACTIONATED)
Heparin Unfractionated: 0.34 [IU]/mL (ref 0.30–0.70)
Heparin Unfractionated: <0.1 [IU]/mL — ABNORMAL LOW (ref 0.30–0.70)

## 2023-02-18 LAB — LIPID PANEL
Cholesterol: 118 mg/dL (ref 0–200)
HDL: 36 mg/dL — ABNORMAL LOW (ref >40)
LDL Cholesterol: 75 mg/dL (ref 0–99)
Total CHOL/HDL Ratio: 3.3 {ratio}
Triglycerides: 33 mg/dL (ref <150)
VLDL: 7 mg/dL (ref 0–40)

## 2023-02-18 LAB — HEMOGLOBIN A1C
Hgb A1c MFr Bld: 7.1 % — ABNORMAL HIGH (ref 4.8–5.6)
Mean Plasma Glucose: 157.07 mg/dL

## 2023-02-18 LAB — TSH: TSH: 3.228 u[IU]/mL (ref 0.350–4.500)

## 2023-02-18 LAB — POCT ACTIVATED CLOTTING TIME
Activated Clotting Time: 274 s
Activated Clotting Time: 360 s

## 2023-02-18 LAB — BRAIN NATRIURETIC PEPTIDE: B Natriuretic Peptide: 263.4 pg/mL — ABNORMAL HIGH (ref 0.0–100.0)

## 2023-02-18 LAB — BASIC METABOLIC PANEL
Anion gap: 7 (ref 5–15)
BUN: 18 mg/dL (ref 6–20)
CO2: 23 mmol/L (ref 22–32)
Calcium: 8.3 mg/dL — ABNORMAL LOW (ref 8.9–10.3)
Chloride: 103 mmol/L (ref 98–111)
Creatinine, Ser: 1.01 mg/dL (ref 0.61–1.24)
GFR, Estimated: >60 mL/min (ref >60)
Glucose, Bld: 145 mg/dL — ABNORMAL HIGH (ref 70–99)
Potassium: 4.7 mmol/L (ref 3.5–5.1)
Sodium: 133 mmol/L — ABNORMAL LOW (ref 135–145)

## 2023-02-18 LAB — MAGNESIUM: Magnesium: 2.2 mg/dL (ref 1.7–2.4)

## 2023-02-18 LAB — HIV ANTIBODY (ROUTINE TESTING W REFLEX): HIV Screen 4th Generation wRfx: NONREACTIVE

## 2023-02-18 LAB — TROPONIN I (HIGH SENSITIVITY): Troponin I (High Sensitivity): 2053 ng/L (ref <18)

## 2023-02-18 SURGERY — RIGHT/LEFT HEART CATH AND CORONARY ANGIOGRAPHY
Anesthesia: LOCAL

## 2023-02-18 MED ORDER — ATORVASTATIN CALCIUM 40 MG PO TABS
40.0000 mg | ORAL_TABLET | Freq: Every day | ORAL | Status: DC
Start: 2023-02-18 — End: 2023-02-20
  Administered 2023-02-18 – 2023-02-20 (×3): 40 mg via ORAL
  Filled 2023-02-18 (×3): qty 1

## 2023-02-18 MED ORDER — SODIUM CHLORIDE 0.9% FLUSH
3.0000 mL | Freq: Two times a day (BID) | INTRAVENOUS | Status: DC
  Administered 2023-02-18 – 2023-02-21 (×6): 3 mL via INTRAVENOUS

## 2023-02-18 MED ORDER — NICOTINE 21 MG/24HR TD PT24
21.0000 mg | MEDICATED_PATCH | Freq: Every day | TRANSDERMAL | Status: DC
  Administered 2023-02-18 – 2023-02-21 (×4): 21 mg via TRANSDERMAL
  Filled 2023-02-18 (×4): qty 1

## 2023-02-18 MED ORDER — MIDAZOLAM HCL 2 MG/2ML IJ SOLN
INTRAMUSCULAR | Status: DC | PRN
  Administered 2023-02-18: 1 mg via INTRAVENOUS

## 2023-02-18 MED ORDER — BENZONATATE 100 MG PO CAPS
100.0000 mg | ORAL_CAPSULE | Freq: Three times a day (TID) | ORAL | Status: DC | PRN
  Administered 2023-02-18 – 2023-02-21 (×4): 100 mg via ORAL
  Filled 2023-02-18 (×4): qty 1

## 2023-02-18 MED ORDER — FUROSEMIDE 10 MG/ML IJ SOLN
40.0000 mg | Freq: Once | INTRAMUSCULAR | Status: AC
  Administered 2023-02-18: 40 mg via INTRAVENOUS
  Filled 2023-02-18: qty 4

## 2023-02-18 MED ORDER — SODIUM CHLORIDE 0.9 % IV SOLN: 250.0000 mL | INTRAVENOUS | Status: AC | PRN

## 2023-02-18 MED ORDER — TICAGRELOR 90 MG PO TABS
90.0000 mg | ORAL_TABLET | Freq: Two times a day (BID) | ORAL | Status: DC
  Administered 2023-02-18 – 2023-02-20 (×5): 90 mg via ORAL
  Filled 2023-02-18 (×6): qty 1

## 2023-02-18 MED ORDER — HYDRALAZINE HCL 20 MG/ML IJ SOLN
10.0000 mg | INTRAMUSCULAR | Status: AC | PRN
Start: 2023-02-18 — End: 2023-02-18

## 2023-02-18 MED ORDER — HEPARIN SODIUM (PORCINE) 1000 UNIT/ML IJ SOLN
INTRAMUSCULAR | Status: DC | PRN
  Administered 2023-02-18: 4000 [IU] via INTRAVENOUS
  Administered 2023-02-18: 3000 [IU] via INTRAVENOUS
  Administered 2023-02-18: 2000 [IU] via INTRAVENOUS

## 2023-02-18 MED ORDER — FENTANYL CITRATE (PF) 100 MCG/2ML IJ SOLN
INTRAMUSCULAR | Status: AC
  Filled 2023-02-18: qty 2

## 2023-02-18 MED ORDER — HEPARIN (PORCINE) IN NACL 1000-0.9 UT/500ML-% IV SOLN
INTRAVENOUS | Status: DC | PRN
  Administered 2023-02-18 (×2): 500 mL

## 2023-02-18 MED ORDER — LIDOCAINE HCL (PF) 1 % IJ SOLN
INTRAMUSCULAR | Status: AC
  Filled 2023-02-18: qty 30

## 2023-02-18 MED ORDER — NICOTINE POLACRILEX 2 MG MT GUM: 2.0000 mg | CHEWING_GUM | OROMUCOSAL | Status: DC | PRN

## 2023-02-18 MED ORDER — ONDANSETRON HCL 4 MG/2ML IJ SOLN: 4.0000 mg | Freq: Four times a day (QID) | INTRAMUSCULAR | Status: DC | PRN

## 2023-02-18 MED ORDER — HEPARIN SODIUM (PORCINE) 1000 UNIT/ML IJ SOLN
INTRAMUSCULAR | Status: AC
Start: 2023-02-18 — End: ?
  Filled 2023-02-18: qty 10

## 2023-02-18 MED ORDER — DOCUSATE SODIUM 100 MG PO CAPS
100.0000 mg | ORAL_CAPSULE | Freq: Two times a day (BID) | ORAL | Status: DC | PRN
  Administered 2023-02-18: 100 mg via ORAL
  Filled 2023-02-18: qty 1

## 2023-02-18 MED ORDER — TICAGRELOR 90 MG PO TABS
ORAL_TABLET | ORAL | Status: DC | PRN
  Administered 2023-02-18: 180 mg via ORAL

## 2023-02-18 MED ORDER — LIDOCAINE HCL (PF) 1 % IJ SOLN
INTRAMUSCULAR | Status: DC | PRN
  Administered 2023-02-18 (×2): 2 mL

## 2023-02-18 MED ORDER — ENOXAPARIN SODIUM 40 MG/0.4ML IJ SOSY
40.0000 mg | PREFILLED_SYRINGE | INTRAMUSCULAR | Status: DC
Start: 2023-02-19 — End: 2023-02-21
  Administered 2023-02-19 – 2023-02-20 (×2): 40 mg via SUBCUTANEOUS
  Filled 2023-02-18 (×3): qty 0.4

## 2023-02-18 MED ORDER — HEPARIN BOLUS VIA INFUSION
3000.0000 [IU] | Freq: Once | INTRAVENOUS | Status: AC
  Administered 2023-02-18: 3000 [IU] via INTRAVENOUS
  Filled 2023-02-18: qty 3000

## 2023-02-18 MED ORDER — ASPIRIN 81 MG PO CHEW
81.0000 mg | CHEWABLE_TABLET | Freq: Every day | ORAL | Status: DC
  Administered 2023-02-19 – 2023-02-21 (×3): 81 mg via ORAL
  Filled 2023-02-18 (×3): qty 1

## 2023-02-18 MED ORDER — ASPIRIN 81 MG PO TBEC
81.0000 mg | DELAYED_RELEASE_TABLET | Freq: Every day | ORAL | Status: DC
Start: 2023-02-18 — End: 2023-02-18
  Administered 2023-02-18: 81 mg via ORAL
  Filled 2023-02-18: qty 1

## 2023-02-18 MED ORDER — HEART ATTACK BOUNCING BOOK
Freq: Once | Status: AC
  Filled 2023-02-18: qty 1

## 2023-02-18 MED ORDER — IOHEXOL 350 MG/ML SOLN
INTRAVENOUS | Status: DC | PRN
  Administered 2023-02-18: 105 mL

## 2023-02-18 MED ORDER — TICAGRELOR 90 MG PO TABS
ORAL_TABLET | ORAL | Status: AC
Start: 2023-02-18 — End: ?
  Filled 2023-02-18: qty 2

## 2023-02-18 MED ORDER — FENTANYL CITRATE (PF) 100 MCG/2ML IJ SOLN
INTRAMUSCULAR | Status: DC | PRN
  Administered 2023-02-18: 25 ug via INTRAVENOUS

## 2023-02-18 MED ORDER — ACETAMINOPHEN 325 MG PO TABS: 650.0000 mg | ORAL_TABLET | ORAL | Status: DC | PRN

## 2023-02-18 MED ORDER — NITROGLYCERIN 1 MG/10 ML FOR IR/CATH LAB
INTRA_ARTERIAL | Status: AC
  Filled 2023-02-18: qty 10

## 2023-02-18 MED ORDER — VERAPAMIL HCL 2.5 MG/ML IV SOLN
INTRAVENOUS | Status: AC
  Filled 2023-02-18: qty 2

## 2023-02-18 MED ORDER — SODIUM CHLORIDE 0.9% FLUSH: 3.0000 mL | INTRAVENOUS | Status: DC | PRN

## 2023-02-18 MED ORDER — MIDAZOLAM HCL 2 MG/2ML IJ SOLN
INTRAMUSCULAR | Status: AC
  Filled 2023-02-18: qty 2

## 2023-02-18 SURGICAL SUPPLY — 20 items
BALLN EMERGE MR 2.25X12 (BALLOONS) ×1
BALLN ~~LOC~~ EMERGE MR 2.5X8 (BALLOONS) ×1
BALLOON EMERGE MR 2.25X12 (BALLOONS) IMPLANT
BALLOON ~~LOC~~ EMERGE MR 2.5X8 (BALLOONS) IMPLANT
CATH 5FR JL3.5 JR4 ANG PIG MP (CATHETERS) IMPLANT
CATH BALLN WEDGE 5F 110CM (CATHETERS) IMPLANT
CATH LAUNCHER 6FR EBU3.5 (CATHETERS) IMPLANT
DEVICE RAD COMP TR BAND LRG (VASCULAR PRODUCTS) IMPLANT
GLIDESHEATH SLEND SS 6F .021 (SHEATH) IMPLANT
GUIDEWIRE INQWIRE 1.5J.035X260 (WIRE) IMPLANT
INQWIRE 1.5J .035X260CM (WIRE) ×1
KIT ENCORE 26 ADVANTAGE (KITS) IMPLANT
PACK CARDIAC CATHETERIZATION (CUSTOM PROCEDURE TRAY) ×1 IMPLANT
SET ATX-X65L (MISCELLANEOUS) IMPLANT
SHEATH GLIDE SLENDER 4/5FR (SHEATH) IMPLANT
SHEATH PROBE COVER 6X72 (BAG) IMPLANT
STENT SYNERGY XD 2.50X16 (Permanent Stent) IMPLANT
SYNERGY XD 2.50X16 (Permanent Stent) ×1 IMPLANT
TUBING CIL FLEX 10 FLL-RA (TUBING) IMPLANT
WIRE ASAHI PROWATER 180CM (WIRE) IMPLANT

## 2023-02-18 NOTE — Plan of Care (Signed)
  Problem: Education: Goal: Understanding of cardiac disease, CV risk reduction, and recovery process will improve Outcome: Progressing Goal: Individualized Educational Video(s) Outcome: Progressing   Problem: Activity: Goal: Ability to tolerate increased activity will improve Outcome: Progressing   Problem: Cardiac: Goal: Ability to achieve and maintain adequate cardiovascular perfusion will improve Outcome: Progressing   Problem: Health Behavior/Discharge Planning: Goal: Ability to safely manage health-related needs after discharge will improve Outcome: Progressing   Problem: Education: Goal: Understanding of CV disease, CV risk reduction, and recovery process will improve Outcome: Progressing Goal: Individualized Educational Video(s) Outcome: Progressing   Problem: Activity: Goal: Ability to return to baseline activity level will improve Outcome: Progressing   Problem: Cardiovascular: Goal: Ability to achieve and maintain adequate cardiovascular perfusion will improve Outcome: Progressing Goal: Vascular access site(s) Level 0-1 will be maintained Outcome: Progressing   Problem: Health Behavior/Discharge Planning: Goal: Ability to safely manage health-related needs after discharge will improve Outcome: Progressing   Problem: Education: Goal: Knowledge of General Education information will improve Description: Including pain rating scale, medication(s)/side effects and non-pharmacologic comfort measures Outcome: Progressing   Problem: Health Behavior/Discharge Planning: Goal: Ability to manage health-related needs will improve Outcome: Progressing   Problem: Clinical Measurements: Goal: Ability to maintain clinical measurements within normal limits will improve Outcome: Progressing Goal: Will remain free from infection Outcome: Progressing Goal: Diagnostic test results will improve Outcome: Progressing Goal: Respiratory complications will improve Outcome:  Progressing Goal: Cardiovascular complication will be avoided Outcome: Progressing   Problem: Activity: Goal: Risk for activity intolerance will decrease Outcome: Progressing   Problem: Nutrition: Goal: Adequate nutrition will be maintained Outcome: Progressing   Problem: Coping: Goal: Level of anxiety will decrease Outcome: Progressing   Problem: Elimination: Goal: Will not experience complications related to bowel motility Outcome: Progressing Goal: Will not experience complications related to urinary retention Outcome: Progressing   Problem: Pain Management: Goal: General experience of comfort will improve Outcome: Progressing   Problem: Safety: Goal: Ability to remain free from injury will improve Outcome: Progressing   Problem: Skin Integrity: Goal: Risk for impaired skin integrity will decrease Outcome: Progressing   Problem: Education: Goal: Knowledge of General Education information will improve Description: Including pain rating scale, medication(s)/side effects and non-pharmacologic comfort measures Outcome: Progressing   Problem: Health Behavior/Discharge Planning: Goal: Ability to manage health-related needs will improve Outcome: Progressing   Problem: Clinical Measurements: Goal: Ability to maintain clinical measurements within normal limits will improve Outcome: Progressing Goal: Will remain free from infection Outcome: Progressing Goal: Diagnostic test results will improve Outcome: Progressing Goal: Respiratory complications will improve Outcome: Progressing Goal: Cardiovascular complication will be avoided Outcome: Progressing   Problem: Activity: Goal: Risk for activity intolerance will decrease Outcome: Progressing   Problem: Nutrition: Goal: Adequate nutrition will be maintained Outcome: Progressing   Problem: Coping: Goal: Level of anxiety will decrease Outcome: Progressing   Problem: Elimination: Goal: Will not experience  complications related to bowel motility Outcome: Progressing Goal: Will not experience complications related to urinary retention Outcome: Progressing   Problem: Pain Management: Goal: General experience of comfort will improve Outcome: Progressing   Problem: Safety: Goal: Ability to remain free from injury will improve Outcome: Progressing   Problem: Skin Integrity: Goal: Risk for impaired skin integrity will decrease Outcome: Progressing

## 2023-02-18 NOTE — Progress Notes (Signed)
Patient complained of Shortness of breath. Patient is on Brilinta. Cola given and placed on 2L of O2. Sats 98-100 %

## 2023-02-18 NOTE — Progress Notes (Addendum)
Rounding Note    Patient Name: Jimmy Parker Date of Encounter: 02/18/2023  [facility] Cardiologist: Dr. Jens Som  Subjective   Patient was admitted overnight for NSTEMI after presenting with epigastric pain. He continue to have some epigastric pain that he ranks as a 9/10 on the pain scale although he does not look unfortunately. He also reports feeling short of breath when laying down and continues to have a cough. Patient states cough has been going on for a month but family states he has had this for a really long time and attribute it to his smoking history.   Inpatient Medications    Scheduled Meds:  aspirin EC  81 mg Oral Daily   atorvastatin  40 mg Oral Daily   Continuous Infusions:  heparin 1,050 Units/hr (02/18/23 0641)   PRN Meds: acetaminophen, ondansetron (ZOFRAN) IV   Vital Signs    Vitals:   02/18/23 0600 02/18/23 0605 02/18/23 0800 02/18/23 0900  BP: 127/75  135/75 130/70  Pulse: (!) 49  (!) 54 (!) 50  Resp: (!) 23  (!) 33 (!) 28  Temp:  98.4 F (36.9 C)    TempSrc:  Oral    SpO2: 94%  98% 99%  Weight:      Height:       No intake or output data in the 24 hours ending 02/18/23 0936    02/17/2023    5:43 PM 04/01/2022    9:48 AM 01/15/2022   10:00 AM  Last 3 Weights  Weight (lbs) 140 lb 147 lb 145 lb 3.2 oz  Weight (kg) 63.504 kg 66.679 kg 65.862 kg      Telemetry    Junctional rhythm with rates in the 40s to 60s. - Personally Reviewed  ECG    No new ECG tracing today. - Personally Reviewed  Physical Exam   GEN: No acute distress.   Neck: Distended neck veins. Cardiac: Bradycardic with normal rhythm. No murmurs, rubs, or gallops.  Respiratory: No increased work of breathing. Decreased breath sounds in bases but no significant crackles.  GI: Soft and non-distended but mild tenderness with palpation of RUQ. MS: Trace lower extremity edema bilaterally. No deformity. Neuro:  No focal deficits. Psych: Normal affect.  Labs     High Sensitivity Troponin:   Recent Labs  Lab 02/17/23 2209 02/18/23 0550  TROPONINIHS 2,740* 2,053*     Chemistry Recent Labs  Lab 02/17/23 1745 02/18/23 0550  NA 136 133*  K 3.8 4.7  CL 101 103  CO2 26 23  GLUCOSE 138* 145*  BUN 16 18  CREATININE 1.10 1.01  CALCIUM 8.7* 8.3*  MG  --  2.2  PROT 5.3*  --   ALBUMIN 3.2*  --   AST 93*  --   ALT 179*  --   ALKPHOS 78  --   BILITOT 1.6*  --   GFRNONAA >60 >60  ANIONGAP 9 7    Lipids  Recent Labs  Lab 02/18/23 0550  CHOL 118  TRIG 33  HDL 36*  LDLCALC 75  CHOLHDL 3.3    Hematology Recent Labs  Lab 02/17/23 1745 02/18/23 0022  WBC 6.5 7.5  RBC 5.18 4.96  HGB 14.9 14.1  HCT 46.8 44.1  MCV 90.3 88.9  MCH 28.8 28.4  MCHC 31.8 32.0  RDW 13.5 13.7  PLT 179 174   Thyroid  Recent Labs  Lab 02/18/23 0305  TSH 3.228    BNP Recent Labs  Lab 02/18/23 0305  BNP 263.4*    DDimer No results for input(s): "DDIMER" in the last 168 hours.   Radiology    DG Chest Port 1 View  Result Date: 02/18/2023 CLINICAL DATA:  Pain. EXAM: PORTABLE CHEST 1 VIEW COMPARISON:  Radiograph 11/06/2022 FINDINGS: Unchanged cardiomegaly. Stable mediastinal contours. No pulmonary edema. No focal airspace disease. No pleural effusion or pneumothorax. Mild limited assessment, no acute osseous findings. IMPRESSION: Unchanged cardiomegaly. No acute chest findings. Electronically Signed   By: Narda Rutherford M.D.   On: 02/18/2023 00:21   US Abdomen Limited RUQ (LIVER/GB)  Result Date: 02/18/2023 CLINICAL DATA:  Abdominal pain. EXAM: ULTRASOUND ABDOMEN LIMITED RIGHT UPPER QUADRANT COMPARISON:  11/06/2022 FINDINGS: Gallbladder: Partially distended. No gallstones. Borderline gallbladder wall thickening is nonspecific in this setting. No sonographic Murphy sign noted by sonographer. Common bile duct: Diameter: 3 mm, normal Liver: No focal lesion identified. Increased in parenchymal echogenicity. Subtle capsular nodularity. Portal vein is  patent on color Doppler imaging with normal direction of blood flow towards the liver. Other: Trace perihepatic ascites and fluid in Morrison's pouch. IMPRESSION: 1. No gallstones. Borderline gallbladder wall thickening is nonspecific in the setting of ascites and chronic liver disease. 2. Increased hepatic echogenicity with subtle capsular nodularity, suspicious for cirrhosis. 3. Trace perihepatic ascites and fluid in Morrison's pouch. 4. No biliary dilatation. Electronically Signed   By: Narda Rutherford M.D.   On: 02/18/2023 00:20    Cardiac Studies   Left Cardiac Catheterization 06/23/2015: Impression: Prox RCA lesion, 20% stenosed. Mid RCA lesion, 100% stenosed. Mid Cx lesion, 20% stenosed. Prox LAD to Mid LAD lesion, 20% stenosed. There is moderate left ventricular systolic dysfunction.   1. Complete occlusion of the mid RCA with filling of the distal RCA branches by left to right collaterals.  2. Mild non-obstructive plaque in the LAD and Circumflex 3. Moderate segmental LV systolic dysfunction   Recommendations: His infarct likely happened over 48 hours ago. The RCA is now completely occluded with good collateral filling. Will not attempt to open vessel given chronicity. Aggressive risk factor modification. _______________  Echocardiogram 07/08/2015: Study Conclusions: - Left ventricle: The cavity size was normal. Wall thickness was    increased in a pattern of mild LVH. Systolic function was mildly    reduced. The estimated ejection fraction was in the range of 45%    to 50%. Inferior hypokinesis. Features are consistent with a    pseudonormal left ventricular filling pattern, with concomitant    abnormal relaxation and increased filling pressure (grade 2    diastolic dysfunction). Longitudinal strain, TDI: 17 %.  - Aortic valve: There was no stenosis.  - Mitral valve: There was no significant regurgitation.  - Right ventricle: The cavity size was normal. Systolic function     was normal.  - Pulmonary arteries: No complete TR doppler jet so unable to    estimate PA systolic pressure.  - Inferior vena cava: The vessel was normal in size. The    respirophasic diameter changes were in the normal range (= 50%),    consistent with normal central venous pressure.  - Pericardium, extracardiac: Pleural effusion noted.   Impressions: - Normal LV size with mild LV hypertrophy. EF 45-50% with inferior    hypokinesis. Moderate diastolic dysfunction. Normal RV size and    systolic function. No significant valvular abnormalities.   Patient Profile     52 y.o. male with a history of CAD with late presenting NSTEMI in 2017 (treated medically), ischemic cardiomyopathy with mildly reduced EF  of 45-50% in 2017, hypertension, hyperlipidemia, type 2 diabetes mellitus, and tobacco abuse who was admitted for NSTEMI after presenting with epigastric pain.  Assessment & Plan    NSTEMI CAD Patient has a history of CAD with late presenting NSTEMI in 2017. LHC at that time showed CTO of mid RCA with left to right collaterals and otherwise on ly mild non-obstructive disease in the LAD and LCX. He now presents with epigastric pain and dyspnea for the past 5 days. This is similar to the symptoms he had prior to MI in 2017. EKG shows a junctional rhythm with lateral T wave inversion. High-sensitivity troponin 2,740 >> 2,053.  - He continues to have epigastric pain.  - Continue IV Heparin. - No beta-blocker given junctional bradycardia. - Continue aspirin and statin.  - Suspect his event was likely 5 days ago as that is when symptoms started and high-sensitivity already down-trending. He needs a cardiac catheterization. However, he is still have dyspnea and cough and he is worried about laying flat. He looks like he is breathing comfortably laying back in bed. I will give another dose of IV Lasix and Tessalon perles to help with his cough and then hopefully we can perform a right/ left cardiac  catheterization later today.  The patient understands that risks include but are not limited to stroke (1 in 1000), death (1 in 1000), kidney failure usually temporary (1 in 500), bleeding (1 in 200), allergic reaction possibly serious (1 in 200), and agrees to proceed.   Acute on Chronic HFmrEF  Echo in 2017 at time of MI showed LVEF of 45-50% with inferior hypokinesis. Patient presented with epigastric pain and dyspnea/ orthopnea. BNP mildly elevated at 263.4. Chest x-ray showed unchanged cardiomegaly but no acute findings. He was given a dose of IV Lasix in the ED but no documented output.  - He does look mildly elevated on exam.  - Will given another dose of IV Lasix 40mg  this morning. - No beta-blocker given junctional bradycardia. - Can add GDMT based on repeat Echo and cath results.  Junctional Bradycardia EKG and telemetry shows a junction bradycardia. Rates in the 40s to 60s on telemetry.  - Electrolytes and TSH normal.  - Avoid AV nodal agents.  - Wonder if this is due to new RCA disease. Plan is for R/ LHC later today.  Hypertension BP most well controlled.  - Not requiring any medications right now.  Hyperlipidemia Lipid panel this admission: Total Cholesterol 118, Triglycerides 33, HDL 36, LDL 75.  - Started on Lipitor 40mg  daily. Continue.   Type 2 Diabetes Mellitus Not on any medications at home. Hemoglobin A1c 7.1 this admission. - Can consider starting SGLT2 inhibitor prior to discharge. - Will need to follow-up with PCP.  Epigastric Pain Elevated LFTs Patient reports epigastric pain for 5 days and also has some RUQ pain on exam. LFTs elevated: Albumin 3.2, AST 93, ALT 179. Alk Phos and Total Bili normal. Lipase normal. RUQ ultrasound showed no gallstones but borderline gallbladder wall thickening (which is non-specific in setting of ascites and chronic liver disease) as well as increased hepatic echogenicity with subtle capsular nodularity (suspicious for cirrhosis),  and trace perihepatic ascites and in fluid Morrison's pouch. - He continue to complain of abdominal pain which has been his anginal equivalent in the best. - Wonder if elevated LFTs are due to hepatic congestion from CHF vs underlying liver disease. - He denies heavy alcohol use or Tylenol use. - Will need outpatient GI referral.  If cardiac catheterization shows new obstructive disease and he continues to have pain, may need inpatient evaluation.  Tobacco Abuse He currently smokes 1 pack per day.  - Discussed importance of complete cessation.  For questions or updates, please contact [facility] Please consult [email or web address] for contact info under    Signed, Corrin Parker, PA-C  02/18/2023, 9:36 AM   As above, patient seen and examined.  Patient was admitted with 4 days of epigastric pain associated with nausea and dyspnea.  Also with cough and generalized malaise/fatigue.  He states his symptoms are similar to previous infarct pain.  Initial troponin 2740 followed by 2053.  BNP 263.  Creatinine 1.01.  AST 93, ALT 179 and hemoglobin 14.1.  Electrocardiogram shows junctional bradycardia with lateral T wave inversion.  Chest x-ray without acute infiltrates.  Abdominal ultrasound showed no gallstones but felt to be suspicious for cirrhosis.  Presentation seems consistent with late presenting non-ST elevation myocardial infarction but has some atypical features.  However he states his symptoms are similar to previous infarct pain in his enzymes are increased.  Plan right and left cardiac catheterization.  The risk and benefits including myocardial infarction, CVA and death discussed and he agrees to proceed.  Continue aspirin, heparin and statin.  No beta-blocker in the setting of junctional bradycardia.  Patient was given Lasix x 1 for dyspnea/cough.  Check echocardiogram for LV function.  Etiology of elevated liver functions unclear.  There was some elevation in July though worse today.   Gallbladder ultrasound showed no gallstones though suspicious for cirrhosis.  Question secondary to passive congestion.  Patient denies alcohol use.  Will need to follow closely on telemetry given junctional bradycardia.   CRITICAL CARE Performed by: Olga Millers   Total critical care time: 30 minutes  Critical care time was exclusive of separately billable procedures and treating other patients.  Critical care was necessary to treat or prevent imminent or life-threatening deterioration.  Critical care was time spent personally by me on the following activities: development of treatment plan with patient and/or surrogate as well as nursing, discussions with consultants, evaluation of patient's response to treatment, examination of patient, obtaining history from patient or surrogate, ordering and performing treatments and interventions, ordering and review of laboratory studies, ordering and review of radiographic studies, pulse oximetry and re-evaluation of patient's condition.   Olga Millers, MD

## 2023-02-18 NOTE — ED Notes (Signed)
Lt green and blue lab tubes drawn and hand delivered to lab.

## 2023-02-18 NOTE — H&P (View-Only) (Signed)
Rounding Note    Patient Name: Jimmy Parker Date of Encounter: 02/18/2023  [facility] Cardiologist: Dr. Jens Som  Subjective   Patient was admitted overnight for NSTEMI after presenting with epigastric pain. He continue to have some epigastric pain that he ranks as a 9/10 on the pain scale although he does not look unfortunately. He also reports feeling short of breath when laying down and continues to have a cough. Patient states cough has been going on for a month but family states he has had this for a really long time and attribute it to his smoking history.   Inpatient Medications    Scheduled Meds:  aspirin EC  81 mg Oral Daily   atorvastatin  40 mg Oral Daily   Continuous Infusions:  heparin 1,050 Units/hr (02/18/23 0641)   PRN Meds: acetaminophen, ondansetron (ZOFRAN) IV   Vital Signs    Vitals:   02/18/23 0600 02/18/23 0605 02/18/23 0800 02/18/23 0900  BP: 127/75  135/75 130/70  Pulse: (!) 49  (!) 54 (!) 50  Resp: (!) 23  (!) 33 (!) 28  Temp:  98.4 F (36.9 C)    TempSrc:  Oral    SpO2: 94%  98% 99%  Weight:      Height:       No intake or output data in the 24 hours ending 02/18/23 0936    02/17/2023    5:43 PM 04/01/2022    9:48 AM 01/15/2022   10:00 AM  Last 3 Weights  Weight (lbs) 140 lb 147 lb 145 lb 3.2 oz  Weight (kg) 63.504 kg 66.679 kg 65.862 kg      Telemetry    Junctional rhythm with rates in the 40s to 60s. - Personally Reviewed  ECG    No new ECG tracing today. - Personally Reviewed  Physical Exam   GEN: No acute distress.   Neck: Distended neck veins. Cardiac: Bradycardic with normal rhythm. No murmurs, rubs, or gallops.  Respiratory: No increased work of breathing. Decreased breath sounds in bases but no significant crackles.  GI: Soft and non-distended but mild tenderness with palpation of RUQ. MS: Trace lower extremity edema bilaterally. No deformity. Neuro:  No focal deficits. Psych: Normal affect.  Labs     High Sensitivity Troponin:   Recent Labs  Lab 02/17/23 2209 02/18/23 0550  TROPONINIHS 2,740* 2,053*     Chemistry Recent Labs  Lab 02/17/23 1745 02/18/23 0550  NA 136 133*  K 3.8 4.7  CL 101 103  CO2 26 23  GLUCOSE 138* 145*  BUN 16 18  CREATININE 1.10 1.01  CALCIUM 8.7* 8.3*  MG  --  2.2  PROT 5.3*  --   ALBUMIN 3.2*  --   AST 93*  --   ALT 179*  --   ALKPHOS 78  --   BILITOT 1.6*  --   GFRNONAA >60 >60  ANIONGAP 9 7    Lipids  Recent Labs  Lab 02/18/23 0550  CHOL 118  TRIG 33  HDL 36*  LDLCALC 75  CHOLHDL 3.3    Hematology Recent Labs  Lab 02/17/23 1745 02/18/23 0022  WBC 6.5 7.5  RBC 5.18 4.96  HGB 14.9 14.1  HCT 46.8 44.1  MCV 90.3 88.9  MCH 28.8 28.4  MCHC 31.8 32.0  RDW 13.5 13.7  PLT 179 174   Thyroid  Recent Labs  Lab 02/18/23 0305  TSH 3.228    BNP Recent Labs  Lab 02/18/23 0305  BNP 263.4*    DDimer No results for input(s): "DDIMER" in the last 168 hours.   Radiology    DG Chest Port 1 View  Result Date: 02/18/2023 CLINICAL DATA:  Pain. EXAM: PORTABLE CHEST 1 VIEW COMPARISON:  Radiograph 11/06/2022 FINDINGS: Unchanged cardiomegaly. Stable mediastinal contours. No pulmonary edema. No focal airspace disease. No pleural effusion or pneumothorax. Mild limited assessment, no acute osseous findings. IMPRESSION: Unchanged cardiomegaly. No acute chest findings. Electronically Signed   By: Narda Rutherford M.D.   On: 02/18/2023 00:21   US Abdomen Limited RUQ (LIVER/GB)  Result Date: 02/18/2023 CLINICAL DATA:  Abdominal pain. EXAM: ULTRASOUND ABDOMEN LIMITED RIGHT UPPER QUADRANT COMPARISON:  11/06/2022 FINDINGS: Gallbladder: Partially distended. No gallstones. Borderline gallbladder wall thickening is nonspecific in this setting. No sonographic Murphy sign noted by sonographer. Common bile duct: Diameter: 3 mm, normal Liver: No focal lesion identified. Increased in parenchymal echogenicity. Subtle capsular nodularity. Portal vein is  patent on color Doppler imaging with normal direction of blood flow towards the liver. Other: Trace perihepatic ascites and fluid in Morrison's pouch. IMPRESSION: 1. No gallstones. Borderline gallbladder wall thickening is nonspecific in the setting of ascites and chronic liver disease. 2. Increased hepatic echogenicity with subtle capsular nodularity, suspicious for cirrhosis. 3. Trace perihepatic ascites and fluid in Morrison's pouch. 4. No biliary dilatation. Electronically Signed   By: Narda Rutherford M.D.   On: 02/18/2023 00:20    Cardiac Studies   Left Cardiac Catheterization 06/23/2015: Impression: Prox RCA lesion, 20% stenosed. Mid RCA lesion, 100% stenosed. Mid Cx lesion, 20% stenosed. Prox LAD to Mid LAD lesion, 20% stenosed. There is moderate left ventricular systolic dysfunction.   1. Complete occlusion of the mid RCA with filling of the distal RCA branches by left to right collaterals.  2. Mild non-obstructive plaque in the LAD and Circumflex 3. Moderate segmental LV systolic dysfunction   Recommendations: His infarct likely happened over 48 hours ago. The RCA is now completely occluded with good collateral filling. Will not attempt to open vessel given chronicity. Aggressive risk factor modification. _______________  Echocardiogram 07/08/2015: Study Conclusions: - Left ventricle: The cavity size was normal. Wall thickness was    increased in a pattern of mild LVH. Systolic function was mildly    reduced. The estimated ejection fraction was in the range of 45%    to 50%. Inferior hypokinesis. Features are consistent with a    pseudonormal left ventricular filling pattern, with concomitant    abnormal relaxation and increased filling pressure (grade 2    diastolic dysfunction). Longitudinal strain, TDI: 17 %.  - Aortic valve: There was no stenosis.  - Mitral valve: There was no significant regurgitation.  - Right ventricle: The cavity size was normal. Systolic function     was normal.  - Pulmonary arteries: No complete TR doppler jet so unable to    estimate PA systolic pressure.  - Inferior vena cava: The vessel was normal in size. The    respirophasic diameter changes were in the normal range (= 50%),    consistent with normal central venous pressure.  - Pericardium, extracardiac: Pleural effusion noted.   Impressions: - Normal LV size with mild LV hypertrophy. EF 45-50% with inferior    hypokinesis. Moderate diastolic dysfunction. Normal RV size and    systolic function. No significant valvular abnormalities.   Patient Profile     52 y.o. male with a history of CAD with late presenting NSTEMI in 2017 (treated medically), ischemic cardiomyopathy with mildly reduced EF  of 45-50% in 2017, hypertension, hyperlipidemia, type 2 diabetes mellitus, and tobacco abuse who was admitted for NSTEMI after presenting with epigastric pain.  Assessment & Plan    NSTEMI CAD Patient has a history of CAD with late presenting NSTEMI in 2017. LHC at that time showed CTO of mid RCA with left to right collaterals and otherwise on ly mild non-obstructive disease in the LAD and LCX. He now presents with epigastric pain and dyspnea for the past 5 days. This is similar to the symptoms he had prior to MI in 2017. EKG shows a junctional rhythm with lateral T wave inversion. High-sensitivity troponin 2,740 >> 2,053.  - He continues to have epigastric pain.  - Continue IV Heparin. - No beta-blocker given junctional bradycardia. - Continue aspirin and statin.  - Suspect his event was likely 5 days ago as that is when symptoms started and high-sensitivity already down-trending. He needs a cardiac catheterization. However, he is still have dyspnea and cough and he is worried about laying flat. He looks like he is breathing comfortably laying back in bed. I will give another dose of IV Lasix and Tessalon perles to help with his cough and then hopefully we can perform a right/ left cardiac  catheterization later today.  The patient understands that risks include but are not limited to stroke (1 in 1000), death (1 in 1000), kidney failure usually temporary (1 in 500), bleeding (1 in 200), allergic reaction possibly serious (1 in 200), and agrees to proceed.   Acute on Chronic HFmrEF  Echo in 2017 at time of MI showed LVEF of 45-50% with inferior hypokinesis. Patient presented with epigastric pain and dyspnea/ orthopnea. BNP mildly elevated at 263.4. Chest x-ray showed unchanged cardiomegaly but no acute findings. He was given a dose of IV Lasix in the ED but no documented output.  - He does look mildly elevated on exam.  - Will given another dose of IV Lasix 40mg  this morning. - No beta-blocker given junctional bradycardia. - Can add GDMT based on repeat Echo and cath results.  Junctional Bradycardia EKG and telemetry shows a junction bradycardia. Rates in the 40s to 60s on telemetry.  - Electrolytes and TSH normal.  - Avoid AV nodal agents.  - Wonder if this is due to new RCA disease. Plan is for R/ LHC later today.  Hypertension BP most well controlled.  - Not requiring any medications right now.  Hyperlipidemia Lipid panel this admission: Total Cholesterol 118, Triglycerides 33, HDL 36, LDL 75.  - Started on Lipitor 40mg  daily. Continue.   Type 2 Diabetes Mellitus Not on any medications at home. Hemoglobin A1c 7.1 this admission. - Can consider starting SGLT2 inhibitor prior to discharge. - Will need to follow-up with PCP.  Epigastric Pain Elevated LFTs Patient reports epigastric pain for 5 days and also has some RUQ pain on exam. LFTs elevated: Albumin 3.2, AST 93, ALT 179. Alk Phos and Total Bili normal. Lipase normal. RUQ ultrasound showed no gallstones but borderline gallbladder wall thickening (which is non-specific in setting of ascites and chronic liver disease) as well as increased hepatic echogenicity with subtle capsular nodularity (suspicious for cirrhosis),  and trace perihepatic ascites and in fluid Morrison's pouch. - He continue to complain of abdominal pain which has been his anginal equivalent in the best. - Wonder if elevated LFTs are due to hepatic congestion from CHF vs underlying liver disease. - He denies heavy alcohol use or Tylenol use. - Will need outpatient GI referral.  If cardiac catheterization shows new obstructive disease and he continues to have pain, may need inpatient evaluation.  Tobacco Abuse He currently smokes 1 pack per day.  - Discussed importance of complete cessation.  For questions or updates, please contact [facility] Please consult [email or web address] for contact info under    Signed, Corrin Parker, PA-C  02/18/2023, 9:36 AM   As above, patient seen and examined.  Patient was admitted with 4 days of epigastric pain associated with nausea and dyspnea.  Also with cough and generalized malaise/fatigue.  He states his symptoms are similar to previous infarct pain.  Initial troponin 2740 followed by 2053.  BNP 263.  Creatinine 1.01.  AST 93, ALT 179 and hemoglobin 14.1.  Electrocardiogram shows junctional bradycardia with lateral T wave inversion.  Chest x-ray without acute infiltrates.  Abdominal ultrasound showed no gallstones but felt to be suspicious for cirrhosis.  Presentation seems consistent with late presenting non-ST elevation myocardial infarction but has some atypical features.  However he states his symptoms are similar to previous infarct pain in his enzymes are increased.  Plan right and left cardiac catheterization.  The risk and benefits including myocardial infarction, CVA and death discussed and he agrees to proceed.  Continue aspirin, heparin and statin.  No beta-blocker in the setting of junctional bradycardia.  Patient was given Lasix x 1 for dyspnea/cough.  Check echocardiogram for LV function.  Etiology of elevated liver functions unclear.  There was some elevation in July though worse today.   Gallbladder ultrasound showed no gallstones though suspicious for cirrhosis.  Question secondary to passive congestion.  Patient denies alcohol use.  Will need to follow closely on telemetry given junctional bradycardia.   CRITICAL CARE Performed by: Olga Millers   Total critical care time: 30 minutes  Critical care time was exclusive of separately billable procedures and treating other patients.  Critical care was necessary to treat or prevent imminent or life-threatening deterioration.  Critical care was time spent personally by me on the following activities: development of treatment plan with patient and/or surrogate as well as nursing, discussions with consultants, evaluation of patient's response to treatment, examination of patient, obtaining history from patient or surrogate, ordering and performing treatments and interventions, ordering and review of laboratory studies, ordering and review of radiographic studies, pulse oximetry and re-evaluation of patient's condition.   Olga Millers, MD

## 2023-02-18 NOTE — Progress Notes (Signed)
  Echocardiogram 2D Echocardiogram has been performed.  Reinaldo Raddle Phenix Vandermeulen 02/18/2023, 11:21 AM

## 2023-02-18 NOTE — H&P (Signed)
Cardiology Admission History and Physical   Patient ID: Jimmy Parker MRN: [id number]; DOB: 09/22/1970   Admission date: 02/17/2023  PCP:  Marcine Matar, MD   [facility] Providers Cardiologist:  None        Chief Complaint:  epigastric pain  Patient Profile:   Jimmy Parker is a 52 y.o. male with obstructive CAD (NSTEMI 2017), ischemic cardiomyopathy, DM 2, hypertension, hyperlipidemia, tobacco dependence who is being seen 02/18/2023 for the evaluation of epigastric pain.  History of Present Illness:   Jimmy Parker reports that he was in his usual state of health until Monday 11/4.  He works in Production designer, theatre/television/film.  On the evening of Monday, he noted epigastric discomfort with associated fatigue.  He felt that his symptoms migrated to his lower chest at times.  He also developed dyspnea with minimal exertion as well as orthopnea.  None of the above symptoms were present at any point in the recent past.  Given the symptoms, he missed work for 48 hours.  He attempted going back to work after 48 hours but was unable to given persistent the symptoms.  He has persistent nausea no vomiting, though he is spitting up his saliva.  Has felt chills though no fevers.  Some loose stools.  No history of cholecystectomy or appendectomy.  His previous MI symptoms in 2017 (further details below) were very similar which is what ultimately prompted him to present to the emergency room.  On arrival to the emergency room, Tmax 99.9, HR 50-60s, BP 110s-140s/70s-80s, not requiring supplemental oxygen.  BMP essentially unremarkable.  LFT derangements including AST 93, ALT 179, T. bili 1.6.  CBC completely unremarkable.  Flu, COVID, RSV negative.  UA unremarkable.  EKG with junctional bradycardia and T wave inversions in V4-V6 with T wave flattening in 1 and aVL, no ST segment changes.  High sensitivity troponin thus far at 2740, no repeat yet.  CXR essentially with clear lungs.  Right upper quadrant ultrasound  ordered with results pending.  Received aspirin 324 mg and started on a heparin drip for NSTEMI.   In terms of relevant cardiovascular history, slightly NSTEMI in March 2017.  Cardiac catheterization revealed complete occlusion of the mid RCA with distal filling via left-to-right collaterals.  Also noted was mild nonobstructive disease in the LAD and circumflex artery.  Given that his infarct had happened over 48 hours prior to Cath Lab presentation and good collaterals already present, decision was made to not attempt PCI and focus on aggressive risk factor modification.  Echo at that time with LVEF 45-50% with inferior hypokinesis.  States that he started smoking again about a year ago.  Has not taken any of his prescription medications for over a year as he did not think he did not need them, including metformin, antihypertensive medications and aspirin.  Drinks around 1 alcoholic beverage per week.  Works in maintenance currently.  Past Medical History:  Diagnosis Date   CAD (coronary artery disease)    a. 07/2013 Neg MV;  b. 06/2015 NSTEMI/Cath:LM nl, LAD 59m,  D1/2/3 nl, RI mod/nl, LCX 39m, OM1 small, OM2 nl, RCA 20p, 160m, RPDA fills via collats from OM3, EF 35-45%.   CAD- occluded RCA with collaterals 06/24/2015   Chest pain 08/03/2013   CHF (congestive heart failure) (HCC)    Diabetes mellitus (HCC) 05/28/2008   Qualifier: Diagnosis of  By: Levon Hedger     Diet-controlled diabetes mellitus (HCC)    Dyslipidemia 06/23/2015   DYSMETABOLIC  SYNDROME 08/13/2008   Qualifier: Diagnosis of  By: Daphine Deutscher FNP, [place]     Heart attack [facility])    Hyperlipidemia    Hypertension    Hypertensive heart disease    Ischemic cardiomyopathy    a. 07/2013 Echo: EF 50-55%, no rwma, Gr1 DD;  b. 06/2015 EF 35-45% by Jerilynn Som.    NSTEMI (non-ST elevated myocardial infarction) (HCC) 06/23/2015   Tobacco abuse    a. quit 06/2015.   TOBACCO ABUSE 08/05/2009   Qualifier: Diagnosis of  By: Levon Hedger      Past  Surgical History:  Procedure Laterality Date   CARDIAC CATHETERIZATION N/A 06/23/2015   Procedure: Left Heart Cath and Coronary Angiography;  Surgeon: Kathleene Hazel, MD;  Location: [facility] [place] [facility]VASIVE CV LAB;  Service: Cardiovascular;  Laterality: N/A;   FRACTURE SURGERY     femur and hip   SHOULDER SURGERY     Right: Patient denies any hx of shoulder surgery     Medications Prior to Admission: Prior to Admission medications   Medication Sig Start Date End Date Taking? Authorizing Provider  acetaminophen (TYLENOL) 160 MG/5ML liquid Take 500 mg by mouth 2 (two) times daily.    [provider]  amLODipine (NORVASC) 10 MG tablet Take 1 tablet (10 mg total) by mouth daily. 01/15/22   Marcine Matar, MD  aspirin EC 81 MG tablet Take 1 tablet (81 mg total) by mouth daily. 01/15/22   Marcine Matar, MD  aspirin EC 81 MG tablet Take 1 tablet (81 mg total) by mouth daily. Swallow whole. 01/15/22   Marcine Matar, MD  budesonide-formoterol (SYMBICORT) 80-4.5 MCG/ACT inhaler Inhale 2 puffs into the lungs 2 (two) times daily. 01/15/22   Marcine Matar, MD  dapagliflozin propanediol (FARXIGA) 5 MG TABS tablet Take 1 tablet (5 mg total) by mouth daily before breakfast. 01/15/22   Marcine Matar, MD  nicotine polacrilex (NICORETTE) 2 MG gum Take 1 each (2 mg total) by mouth as needed for smoking cessation. 04/01/22   Marcine Matar, MD  predniSONE (DELTASONE) 20 MG tablet Take 1 tablet (20 mg total) by mouth daily with breakfast. 01/15/22   Marcine Matar, MD  promethazine-dextromethorphan (PROMETHAZINE-DM) 6.25-15 MG/5ML syrup Take 5 mLs by mouth 4 (four) times daily as needed for cough. 11/06/22   Tilden Fossa, MD  rosuvastatin (CRESTOR) 20 MG tablet Take 1 tablet (20 mg total) by mouth daily. 01/15/22   Marcine Matar, MD     Allergies:   No Known Allergies  Social History:   Social History   Socioeconomic History   Marital status: Single    Spouse name: Not on  file   Number of children: Not on file   Years of education: Not on file   Highest education level: Not on file  Occupational History   Not on file  Tobacco Use   Smoking status: Former    Current packs/day: 0.50    Average packs/day: 0.5 packs/day for 7.6 years (3.8 ttl pk-yrs)    Types: Cigarettes    Start date: 06/27/2015   Smokeless tobacco: Never  Substance and Sexual Activity   Alcohol use: No    Alcohol/week: 0.0 standard drinks of alcohol   Drug use: No   Sexual activity: Not on file  Other Topics Concern   Not on file  Social History Narrative   Not on file   Social Determinants of Health   Financial Resource Strain: Not on file  Food Insecurity: Not  on file  Transportation Needs: Not on file  Physical Activity: Not on file  Stress: Not on file  Social Connections: Not on file  Intimate Partner Violence: Not on file    Family History:   The patient's family history includes Heart attack in his maternal grandfather and paternal grandmother; Hypertension in his maternal grandfather.    ROS:  Please see the history of present illness.  All other ROS reviewed and negative.     Physical Exam/Data:   Vitals:   02/17/23 2130 02/17/23 2145 02/17/23 2215 02/17/23 2217  BP: 116/75 139/76 118/60   Pulse: 68 (!) 54 (!) 50   Resp: (!) 33 (!) 31 (!) 21   Temp:    99.9 F (37.7 C)  TempSrc:    Oral  SpO2: 98% 99% 100%   Weight:      Height:       No intake or output data in the 24 hours ending 02/18/23 0033    02/17/2023    5:43 PM 04/01/2022    9:48 AM 01/15/2022   10:00 AM  Last 3 Weights  Weight (lbs) 140 lb 147 lb 145 lb 3.2 oz  Weight (kg) 63.504 kg 66.679 kg 65.862 kg     Body mass index is 21.93 kg/m.  General:  Well nourished, well developed, in no acute distress HEENT: normal Neck: JVD to tragus with plethoric superficial neck veins Vascular: No carotid bruits; Distal pulses 2+ bilaterally   Cardiac: Bradycardic and regular, without murmur Lungs:  Comfortably on room air with crackles at the bases bilaterally Abd: Mild abdominal discomfort with deep palpation in the RUQ Ext: no lower extremity edema Musculoskeletal:  No deformities, BUE and BLE strength normal and equal Skin: warm and dry  Neuro:  CNs 2-12 intact, no focal abnormalities noted Psych:  Normal affect    EKG:  The ECG that was done 02/17/2023 was personally reviewed and demonstrates junctional bradycardia with retrograde P waves and T wave inversions in V4-V6 with T wave flattening in 1 and aVL with no ST segment changes  Relevant CV Studies:  2017 cath He had an MI in 2017.  Cath showed: "Prox RCA lesion, 20% stenosed. Mid RCA lesion, 100% stenosed. Mid Cx lesion, 20% stenosed. Prox LAD to Mid LAD lesion, 20% stenosed. There is moderate left ventricular systolic dysfunction.   1. Complete occlusion of the mid RCA with filling of the distal RCA branches by left to right collaterals.  2. Mild non-obstructive plaque in the LAD and Circumflex 3. Moderate segmental LV systolic dysfunction  06/2015 echo LVEF 45-50% with inferior hypokinesis See report for further details  Laboratory Data:  High Sensitivity Troponin:   Recent Labs  Lab 02/17/23 2209  TROPONINIHS 2,740*      Chemistry Recent Labs  Lab 02/17/23 1745  NA 136  K 3.8  CL 101  CO2 26  GLUCOSE 138*  BUN 16  CREATININE 1.10  CALCIUM 8.7*  GFRNONAA >60  ANIONGAP 9    Recent Labs  Lab 02/17/23 1745  PROT 5.3*  ALBUMIN 3.2*  AST 93*  ALT 179*  ALKPHOS 78  BILITOT 1.6*   Lipids No results for input(s): "CHOL", "TRIG", "HDL", "LABVLDL", "LDLCALC", "CHOLHDL" in the last 168 hours. Hematology Recent Labs  Lab 02/17/23 1745  WBC 6.5  RBC 5.18  HGB 14.9  HCT 46.8  MCV 90.3  MCH 28.8  MCHC 31.8  RDW 13.5  PLT 179   Thyroid No results for input(s): "TSH", "FREET4" in  the last 168 hours. BNPNo results for input(s): "BNP", "PROBNP" in the last 168 hours.  DDimer No results for  input(s): "DDIMER" in the last 168 hours.   Radiology/Studies:  DG Chest Port 1 View  Result Date: 02/18/2023 CLINICAL DATA:  Pain. EXAM: PORTABLE CHEST 1 VIEW COMPARISON:  Radiograph 11/06/2022 FINDINGS: Unchanged cardiomegaly. Stable mediastinal contours. No pulmonary edema. No focal airspace disease. No pleural effusion or pneumothorax. Mild limited assessment, no acute osseous findings. IMPRESSION: Unchanged cardiomegaly. No acute chest findings. Electronically Signed   By: Narda Rutherford M.D.   On: 02/18/2023 00:21   US Abdomen Limited RUQ (LIVER/GB)  Result Date: 02/18/2023 CLINICAL DATA:  Abdominal pain. EXAM: ULTRASOUND ABDOMEN LIMITED RIGHT UPPER QUADRANT COMPARISON:  11/06/2022 FINDINGS: Gallbladder: Partially distended. No gallstones. Borderline gallbladder wall thickening is nonspecific in this setting. No sonographic Murphy sign noted by sonographer. Common bile duct: Diameter: 3 mm, normal Liver: No focal lesion identified. Increased in parenchymal echogenicity. Subtle capsular nodularity. Portal vein is patent on color Doppler imaging with normal direction of blood flow towards the liver. Other: Trace perihepatic ascites and fluid in Morrison's pouch. IMPRESSION: 1. No gallstones. Borderline gallbladder wall thickening is nonspecific in the setting of ascites and chronic liver disease. 2. Increased hepatic echogenicity with subtle capsular nodularity, suspicious for cirrhosis. 3. Trace perihepatic ascites and fluid in Morrison's pouch. 4. No biliary dilatation. Electronically Signed   By: Narda Rutherford M.D.   On: 02/18/2023 00:20     Assessment and Plan:   NSTEMI type I Acute decompensated heart failure (ACS-HF) Presenting with over 72 hours of epigastric discomfort and fatigue with significantly positive high-sensitivity troponin and ischemic EKG changes concerning for type I NSTEMI.  Notably, his symptoms are consistent with his symptoms during NSTEMI in 2017.  Catheterization  in 2017 revealed mid RCA complete occlusion with collaterals from the circumflex territory and nonobstructive disease in the LAD and circumflex.  Noted to have mild LV systolic dysfunction on echo with LVEF 45-50% at that time.  His EKG today shows junctional bradycardia.  With his EKG lateral changes (though notably seen on some prior EKGs), possible that NSTEMI is in the circumflex territory which was supplying collaterals to the RCA which would account for his junctional bradycardia as well (SA nodal involvement due to RCA ischemia) vs more proximal RCA ischemia.  Also presenting with new dyspnea on exertion, orthopnea and elevated central venous pressures with JVD to the tragus consistent with acute decompensated heart failure (ACS-heart failure).  Killip class II.  Will treat for ACS and decompensated heart failure.  Given his right upper quadrant discomfort, RUQ was obtained which showed no gallstones, increased hepatic echogenicity with subtle capsular nodularity suspicious for cirrhosis, no biliary dilatation. - Trend troponin x 2 - Obtain HbA1c, TSH, lipid panel, BNP - Echo in a.m. - N.p.o. at midnight for cardiac catheterization - IV heparin, dosing per pharmacy for ACS - S/p aspirin 324 mg on 11/7, start aspirin 81 mg on 11/8 - Start atorvastatin 40 mg - IV Lasix 40 mg for diuresis - Holding LV systolic dysfunction GDMT pending coronary angiogram and echo results - Strict I's and O's - Keep K> 4 and mag>2  Elevated liver enzymes Has a history of elevated LFTs.  RUQ ultrasound without evidence of biliary dilatation, gallstones with borderline gallbladder wall thickening which is nonspecific in the setting of ascites and evidence of chronic liver disease which was also noted.  Possible that LFT derangements are secondary to chronic liver disease versus  less likely vascular congestion.  Will require GI follow-up in the outpatient setting.  Hypertension Hyperlipidemia - Lipid panel on statin  as above - Hold antihypertensive regimen pending above  Type 2 diabetes mellitus - Obtain HbA1c - Will need to discuss restarting metformin  Smoking dependence - Tobacco cessation counseling  Risk Assessment/Risk Scores:    TIMI Risk Score for Unstable Angina or Non-ST Elevation MI:   The patient's TIMI risk score is 4, which indicates a 20% risk of all cause mortality, new or recurrent myocardial infarction or need for urgent revascularization in the next 14 days.  New York Heart Association (NYHA) Functional Class NYHA Class III    Code Status: Full Code  Severity of Illness: The appropriate patient status for this patient is INPATIENT. Inpatient status is judged to be reasonable and necessary in order to provide the required intensity of service to ensure the patient's safety. The patient's presenting symptoms, physical exam findings, and initial radiographic and laboratory data in the context of their chronic comorbidities is felt to place them at high risk for further clinical deterioration. Furthermore, it is not anticipated that the patient will be medically stable for discharge from the hospital within 2 midnights of admission.   * I certify that at the point of admission it is my clinical judgment that the patient will require inpatient hospital care spanning beyond 2 midnights from the point of admission due to high intensity of service, high risk for further deterioration and high frequency of surveillance required.*   For questions or updates, please contact [facility] Please consult [email or web address] for contact info under     Signed, Aundra Dubin, MD  02/18/2023 12:33 AM

## 2023-02-18 NOTE — Care Management (Signed)
Transition of Care [facility]) - Inpatient Brief Assessment   Patient Details  Name: Jimmy Parker MRN: [id number] Date of Birth: May 22, 1970  Transition of [facility] CM/SW Contact:    Lockie Pares, RN Phone Number: 02/18/2023, 5:31 PM   Clinical Narrative: 52 yo old started having abdominal pain since Monday presented with same.Has had a cough for a mont Troponins elevated  cardiology was consulted  ECHO with 45-50% EF. Taken from ED to Cath lab Complete occlusion of RCA with collateral circulation. Medically manage.  No needs identified at this time.  Please place a consult should needs be identified.   Transition of Care Asessment: Insurance and Status: Insurance coverage has been reviewed Patient has primary care physician: Yes Home environment has been reviewed: Y Prior level of function:: Independent Prior/Current Home Services: No current home services Social Determinants of Health Reivew: SDOH reviewed no interventions necessary Readmission risk has been reviewed: Yes Transition of care needs: no transition of care needs at this time

## 2023-02-18 NOTE — ED Notes (Signed)
Resent lt green tube to lab

## 2023-02-18 NOTE — ED Notes (Signed)
ED TO INPATIENT HANDOFF REPORT  ED Nurse Name and Phone #: Lolita Rieger Name/Age/Gender Jimmy Parker 52 y.o. male Room/Bed: 011C/011C  Code Status   Code Status: Full Code  Home/SNF/Other Home Patient oriented to: self, place, time, and situation Is this baseline? Yes   Triage Complete: Triage complete  Chief Complaint NSTEMI (non-ST elevated myocardial infarction) [facility]) [I21.4]  Triage Note Pt began having abd pain Monday  night. Pt took some kind of medication that did not help. Took two tums today with no improvement. Today pt began having diarrhea. Pain is 8/10. Denies urinary symptoms   Allergies No Known Allergies  Level of Care/Admitting Diagnosis ED Disposition     ED Disposition  Admit   Condition  --   Comment  Hospital Area: [facility] [[id number]]  Level of Care: Progressive [102]  Admit to Progressive based on following criteria: CARDIOVASCULAR & THORACIC of moderate stability with acute coronary syndrome symptoms/low risk myocardial infarction/hypertensive urgency/arrhythmias/heart failure potentially compromising stability and stable post cardiovascular intervention patients.  May admit patient to [facility] or [facility] if equivalent level of care is available:: No  Covid Evaluation: Confirmed COVID Negative  Diagnosis: NSTEMI (non-ST elevated myocardial infarction) [facility]) [[id number]]  Admitting Physician: Aundra Dubin [[id number]]  Attending Physician: Aundra Dubin [[id number]]  Certification:: I certify this patient will need inpatient services for at least 2 midnights  Expected Medical Readiness: 02/19/2023          B Medical/Surgery History Past Medical History:  Diagnosis Date   CAD (coronary artery disease)    a. 07/2013 Neg MV;  b. 06/2015 NSTEMI/Cath:LM nl, LAD 71m,  D1/2/3 nl, RI mod/nl, LCX 27m, OM1 small, OM2 nl, RCA 20p, 160m, RPDA fills via collats from OM3, EF 35-45%.   CAD- occluded RCA with collaterals  06/24/2015   Chest pain 08/03/2013   CHF (congestive heart failure) (HCC)    Diabetes mellitus (HCC) 05/28/2008   Qualifier: Diagnosis of  By: Levon Hedger     Diet-controlled diabetes mellitus (HCC)    Dyslipidemia 06/23/2015   DYSMETABOLIC SYNDROME 08/13/2008   Qualifier: Diagnosis of  By: Daphine Deutscher FNP, [place]     Heart attack [facility])    Hyperlipidemia    Hypertension    Hypertensive heart disease    Ischemic cardiomyopathy    a. 07/2013 Echo: EF 50-55%, no rwma, Gr1 DD;  b. 06/2015 EF 35-45% by Vgram.    NSTEMI (non-ST elevated myocardial infarction) (HCC) 06/23/2015   Tobacco abuse    a. quit 06/2015.   TOBACCO ABUSE 08/05/2009   Qualifier: Diagnosis of  By: Levon Hedger     Past Surgical History:  Procedure Laterality Date   CARDIAC CATHETERIZATION N/A 06/23/2015   Procedure: Left Heart Cath and Coronary Angiography;  Surgeon: Kathleene Hazel, MD;  Location: [facility] INVASIVE CV LAB;  Service: Cardiovascular;  Laterality: N/A;   FRACTURE SURGERY     femur and hip   SHOULDER SURGERY     Right: Patient denies any hx of shoulder surgery     A IV Location/Drains/Wounds Patient Lines/Drains/Airways Status     Active Line/Drains/Airways     Name Placement date Placement time Site Days   Peripheral IV 02/17/23 18 G Anterior;Proximal;Right Forearm 02/17/23  2117  Forearm  1   Peripheral IV 02/18/23 20 G Anterior;Left Forearm 02/18/23  0021  Forearm  less than 1            Intake/Output Last 24 hours No intake  or output data in the 24 hours ending 02/18/23 1228  Labs/Imaging Results for orders placed or performed during the hospital encounter of 02/17/23 (from the past 48 hour(s))  Comprehensive metabolic panel     Status: Abnormal   Collection Time: 02/17/23  5:45 PM  Result Value Ref Range   Sodium 136 135 - 145 mmol/L   Potassium 3.8 3.5 - 5.1 mmol/L   Chloride 101 98 - 111 mmol/L   CO2 26 22 - 32 mmol/L   Glucose, Bld 138 (H) 70 - 99 mg/dL    Comment: Glucose  reference range applies only to samples taken after fasting for at least 8 hours.   BUN 16 6 - 20 mg/dL   Creatinine, Ser 1.61 0.61 - 1.24 mg/dL   Calcium 8.7 (L) 8.9 - 10.3 mg/dL   Total Protein 5.3 (L) 6.5 - 8.1 g/dL   Albumin 3.2 (L) 3.5 - 5.0 g/dL   AST 93 (H) 15 - 41 U/L   ALT 179 (H) 0 - 44 U/L   Alkaline Phosphatase 78 38 - 126 U/L   Total Bilirubin 1.6 (H) <1.2 mg/dL   GFR, Estimated >09 >60 mL/min    Comment: (NOTE) Calculated using the CKD-EPI Creatinine Equation (2021)    Anion gap 9 5 - 15    Comment: Performed at [facility] Lab, [address] [place], [place] [address]  Lipase, blood     Status: None   Collection Time: 02/17/23  5:45 PM  Result Value Ref Range   Lipase 44 11 - 51 U/L    Comment: Performed at [facility] Lab, [address], [place], [place] [address]  CBC with Diff     Status: None   Collection Time: 02/17/23  5:45 PM  Result Value Ref Range   WBC 6.5 4.0 - 10.5 K/uL   RBC 5.18 4.22 - 5.81 MIL/uL   Hemoglobin 14.9 13.0 - 17.0 g/dL   HCT 47.8 29.5 - 62.1 %   MCV 90.3 80.0 - 100.0 fL   MCH 28.8 26.0 - 34.0 pg   MCHC 31.8 30.0 - 36.0 g/dL   RDW 30.8 65.7 - 84.6 %   Platelets 179 150 - 400 K/uL   nRBC 0.0 0.0 - 0.2 %   Neutrophils Relative % 64 %   Neutro Abs 4.1 1.7 - 7.7 K/uL   Lymphocytes Relative 22 %   Lymphs Abs 1.4 0.7 - 4.0 K/uL   Monocytes Relative 11 %   Monocytes Absolute 0.7 0.1 - 1.0 K/uL   Eosinophils Relative 1 %   Eosinophils Absolute 0.1 0.0 - 0.5 K/uL   Basophils Relative 1 %   Basophils Absolute 0.0 0.0 - 0.1 K/uL   Immature Granulocytes 1 %   Abs Immature Granulocytes 0.04 0.00 - 0.07 K/uL    Comment: Performed at [facility] Lab, [address], [place], [place] [address]  Urinalysis, Routine w reflex microscopic -Urine, Clean Catch     Status: Abnormal   Collection Time: 02/17/23  5:45 PM  Result Value Ref Range   Color, Urine YELLOW YELLOW   APPearance CLEAR CLEAR   Specific Gravity, Urine 1.019 1.005 -  1.030   pH 5.0 5.0 - 8.0   Glucose, UA 150 (A) NEGATIVE mg/dL   Hgb urine dipstick NEGATIVE NEGATIVE   Bilirubin Urine NEGATIVE NEGATIVE   Ketones, ur NEGATIVE NEGATIVE mg/dL   Protein, ur NEGATIVE NEGATIVE mg/dL   Nitrite NEGATIVE NEGATIVE   Leukocytes,Ua NEGATIVE NEGATIVE  Comment: Performed at [facility] Lab, [address], [place], [place] [address]  Troponin I (High Sensitivity)     Status: Abnormal   Collection Time: 02/17/23 10:09 PM  Result Value Ref Range   Troponin I (High Sensitivity) 2,740 (HH) <18 ng/L    Comment: CRITICAL RESULT CALLED TO, READ BACK BY AND VERIFIED WITH Kem Parkinson RN [phone] 2302 M. ALAMANO (NOTE) Elevated high sensitivity troponin I (hsTnI) values and significant  changes across serial measurements may suggest ACS but many other  chronic and acute conditions are known to elevate hsTnI results.  Refer to the "Links" section for chest pain algorithms and additional  guidance. Performed at [facility] Lab, [address], [place], [place] [address]   Resp panel by RT-PCR (RSV, Flu A&B, Covid) Anterior Nasal Swab     Status: None   Collection Time: 02/17/23 10:09 PM   Specimen: Anterior Nasal Swab  Result Value Ref Range   SARS Coronavirus 2 by RT PCR NEGATIVE NEGATIVE   Influenza A by PCR NEGATIVE NEGATIVE   Influenza B by PCR NEGATIVE NEGATIVE    Comment: (NOTE) The Xpert Xpress SARS-CoV-2/FLU/RSV plus assay is intended as an aid in the diagnosis of influenza from Nasopharyngeal swab specimens and should not be used as a sole basis for treatment. Nasal washings and aspirates are unacceptable for Xpert Xpress SARS-CoV-2/FLU/RSV testing.  Fact Sheet for Patients: BloggerCourse.com  Fact Sheet for Healthcare Providers: SeriousBroker.it  This test is not yet approved or cleared by the [place] FDA and has been authorized for detection and/or diagnosis of SARS-CoV-2 by FDA under an  Emergency Use Authorization (EUA). This EUA will remain in effect (meaning this test can be used) for the duration of the COVID-19 declaration under Section 564(b)(1) of the Act, 21 U.S.C. section 360bbb-3(b)(1), unless the authorization is terminated or revoked.     Resp Syncytial Virus by PCR NEGATIVE NEGATIVE    Comment: (NOTE) Fact Sheet for Patients: BloggerCourse.com  Fact Sheet for Healthcare Providers: SeriousBroker.it  This test is not yet approved or cleared by the [place] FDA and has been authorized for detection and/or diagnosis of SARS-CoV-2 by FDA under an Emergency Use Authorization (EUA). This EUA will remain in effect (meaning this test can be used) for the duration of the COVID-19 declaration under Section 564(b)(1) of the Act, 21 U.S.C. section 360bbb-3(b)(1), unless the authorization is terminated or revoked.  Performed at [facility] Lab, [address] [place], [place] [address]   Heparin level (unfractionated)     Status: None   Collection Time: 02/18/23 12:22 AM  Result Value Ref Range   Heparin Unfractionated 0.34 0.30 - 0.70 IU/mL    Comment: (NOTE) The clinical reportable range upper limit is being lowered to >1.10 to align with the FDA approved guidance for the current laboratory assay.  If heparin results are below expected values, and patient dosage has  been confirmed, suggest follow up testing of antithrombin III levels. Performed at [facility] Lab, [address] [place], [place] [address]   CBC     Status: None   Collection Time: 02/18/23 12:22 AM  Result Value Ref Range   WBC 7.5 4.0 - 10.5 K/uL   RBC 4.96 4.22 - 5.81 MIL/uL   Hemoglobin 14.1 13.0 - 17.0 g/dL   HCT 32.9 51.8 - 84.1 %   MCV 88.9 80.0 - 100.0 fL   MCH 28.4 26.0 - 34.0 pg   MCHC 32.0 30.0 -  36.0 g/dL   RDW 01.0 93.2 - 35.5 %   Platelets 174 150 - 400 K/uL   nRBC 0.0 0.0 - 0.2 %    Comment: Performed at  [facility] Lab, [address], [place], [place] [address]  HIV Antibody (routine testing w rflx)     Status: None   Collection Time: 02/18/23  3:05 AM  Result Value Ref Range   HIV Screen 4th Generation wRfx Non Reactive Non Reactive    Comment: Performed at [facility] Lab, [address] [place], [place] [address]  TSH     Status: None   Collection Time: 02/18/23  3:05 AM  Result Value Ref Range   TSH 3.228 0.350 - 4.500 uIU/mL    Comment: Performed by a 3rd Generation assay with a functional sensitivity of <=0.01 uIU/mL. Performed at [facility] Lab, [address], [place], [place] [address]   Hemoglobin A1c     Status: Abnormal   Collection Time: 02/18/23  3:05 AM  Result Value Ref Range   Hgb A1c MFr Bld 7.1 (H) 4.8 - 5.6 %    Comment: (NOTE) Pre diabetes:          5.7%-6.4%  Diabetes:              >6.4%  Glycemic control for   <7.0% adults with diabetes    Mean Plasma Glucose 157.07 mg/dL    Comment: Performed at [facility] Lab, [address], [place], [place] [address]  Brain natriuretic peptide     Status: Abnormal   Collection Time: 02/18/23  3:05 AM  Result Value Ref Range   B Natriuretic Peptide 263.4 (H) 0.0 - 100.0 pg/mL    Comment: Performed at [facility] Lab, [address], [place], [place] [address]  Heparin level (unfractionated)     Status: Abnormal   Collection Time: 02/18/23  5:50 AM  Result Value Ref Range   Heparin Unfractionated <0.10 (L) 0.30 - 0.70 IU/mL    Comment: (NOTE) The clinical reportable range upper limit is being lowered to >1.10 to align with the FDA approved guidance for the current laboratory assay.  If heparin results are below expected values, and patient dosage has  been confirmed, suggest follow up testing of antithrombin III levels. Performed at [facility] Lab, [address], [place], [place] [address]   Basic metabolic panel     Status: Abnormal   Collection Time: 02/18/23  5:50 AM  Result Value  Ref Range   Sodium 133 (L) 135 - 145 mmol/L   Potassium 4.7 3.5 - 5.1 mmol/L    Comment: HEMOLYSIS AT THIS LEVEL MAY AFFECT RESULT   Chloride 103 98 - 111 mmol/L   CO2 23 22 - 32 mmol/L   Glucose, Bld 145 (H) 70 - 99 mg/dL    Comment: Glucose reference range applies only to samples taken after fasting for at least 8 hours.   BUN 18 6 - 20 mg/dL   Creatinine, Ser 1.06 0.61 - 1.24 mg/dL   Calcium 8.3 (L) 8.9 - 10.3 mg/dL   GFR, Estimated >26 >94 mL/min    Comment: (NOTE) Calculated using the CKD-EPI Creatinine Equation (2021)    Anion gap 7 5 - 15    Comment: Performed at [facility] Lab, [address], [place], [place] [address]  Lipid panel     Status: Abnormal   Collection Time: 02/18/23  5:50 AM  Result Value Ref Range  Cholesterol 118 0 - 200 mg/dL   Triglycerides 33 <166 mg/dL   HDL 36 (L) >06 mg/dL   Total CHOL/HDL Ratio 3.3 RATIO   VLDL 7 0 - 40 mg/dL   LDL Cholesterol 75 0 - 99 mg/dL    Comment:        Total Cholesterol/HDL:CHD Risk Coronary Heart Disease Risk Table                     Men   Women  1/2 Average Risk   3.4   3.3  Average Risk       5.0   4.4  2 X Average Risk   9.6   7.1  3 X Average Risk  23.4   11.0        Use the calculated Patient Ratio above and the CHD Risk Table to determine the patient's CHD Risk.        ATP III CLASSIFICATION (LDL):  <100     mg/dL   Optimal  301-601  mg/dL   Near or Above                    Optimal  130-159  mg/dL   Borderline  093-235  mg/dL   High  >573     mg/dL   Very High Performed at [facility] Lab, [address], [place], [place] [address]   Magnesium     Status: None   Collection Time: 02/18/23  5:50 AM  Result Value Ref Range   Magnesium 2.2 1.7 - 2.4 mg/dL    Comment: HEMOLYSIS AT THIS LEVEL MAY AFFECT RESULT Performed at [facility], [address], [place], [place] [address]   Troponin I (High Sensitivity)     Status: Abnormal   Collection Time: 02/18/23  5:50 AM  Result Value Ref  Range   Troponin I (High Sensitivity) 2,053 (HH) <18 ng/L    Comment: CRITICAL VALUE NOTED. VALUE IS CONSISTENT WITH PREVIOUSLY REPORTED/CALLED VALUE (NOTE) Elevated high sensitivity troponin I (hsTnI) values and significant  changes across serial measurements may suggest ACS but many other  chronic and acute conditions are known to elevate hsTnI results.  Refer to the "Links" section for chest pain algorithms and additional  guidance. Performed at [facility] Lab, [address], [place], [place] [address]    DG Chest Port 1 View  Result Date: 02/18/2023 CLINICAL DATA:  Pain. EXAM: PORTABLE CHEST 1 VIEW COMPARISON:  Radiograph 11/06/2022 FINDINGS: Unchanged cardiomegaly. Stable mediastinal contours. No pulmonary edema. No focal airspace disease. No pleural effusion or pneumothorax. Mild limited assessment, no acute osseous findings. IMPRESSION: Unchanged cardiomegaly. No acute chest findings. Electronically Signed   By: Narda Rutherford M.D.   On: 02/18/2023 00:21   US Abdomen Limited RUQ (LIVER/GB)  Result Date: 02/18/2023 CLINICAL DATA:  Abdominal pain. EXAM: ULTRASOUND ABDOMEN LIMITED RIGHT UPPER QUADRANT COMPARISON:  11/06/2022 FINDINGS: Gallbladder: Partially distended. No gallstones. Borderline gallbladder wall thickening is nonspecific in this setting. No sonographic Murphy sign noted by sonographer. Common bile duct: Diameter: 3 mm, normal Liver: No focal lesion identified. Increased in parenchymal echogenicity. Subtle capsular nodularity. Portal vein is patent on color Doppler imaging with normal direction of blood flow towards the liver. Other: Trace perihepatic ascites and fluid in Morrison's pouch. IMPRESSION: 1. No gallstones. Borderline gallbladder wall thickening is nonspecific in the setting of ascites and chronic liver disease. 2. Increased hepatic echogenicity with subtle capsular nodularity, suspicious for cirrhosis. 3.  Trace perihepatic ascites and fluid in Morrison's  pouch. 4. No biliary dilatation. Electronically Signed   By: Narda Rutherford M.D.   On: 02/18/2023 00:20    Pending Labs Unresulted Labs (From admission, onward)     Start     Ordered   02/19/23 0500  Heparin level (unfractionated)  Daily,   R      02/17/23 2318   02/18/23 1300  Heparin level (unfractionated)  Once-Timed,   TIMED        02/18/23 0637   02/18/23 0500  CBC  Daily,   R      02/17/23 2318            Vitals/Pain Today's Vitals   02/18/23 0800 02/18/23 0900 02/18/23 1039 02/18/23 1200  BP: 135/75 130/70  (!) 142/68  Pulse: (!) 54 (!) 50  (!) 54  Resp: (!) 33 (!) 28  (!) 35  Temp:   98.1 F (36.7 C)   TempSrc:   Oral   SpO2: 98% 99%  95%  Weight:      Height:      PainSc:        Isolation Precautions No active isolations  Medications Medications  heparin ADULT infusion 100 units/mL (25000 units/241mL) (1,050 Units/hr Intravenous Rate/Dose Change 02/18/23 0641)  aspirin EC tablet 81 mg (81 mg Oral Given 02/18/23 0930)  acetaminophen (TYLENOL) tablet 650 mg (has no administration in time range)  ondansetron (ZOFRAN) injection 4 mg (has no administration in time range)  atorvastatin (LIPITOR) tablet 40 mg (40 mg Oral Given 02/18/23 0930)  benzonatate (TESSALON) capsule 100 mg (100 mg Oral Given 02/18/23 1047)  aspirin chewable tablet 324 mg (324 mg Oral Given 02/17/23 2324)  heparin bolus via infusion 3,500 Units (3,500 Units Intravenous Bolus from Bag 02/17/23 2336)  furosemide (LASIX) injection 40 mg (40 mg Intravenous Given 02/18/23 0120)  heparin bolus via infusion 3,000 Units (3,000 Units Intravenous Bolus from Bag 02/18/23 0638)  furosemide (LASIX) injection 40 mg (40 mg Intravenous Given 02/18/23 1047)    Mobility walks     Focused Assessments Cardiac Assessment Handoff:  Cardiac Rhythm: Normal sinus rhythm Lab Results  Component Value Date   TROPONINI <0.03 06/24/2016   Lab Results  Component Value Date   DDIMER <0.27 08/03/2013   Does the  Patient currently have chest pain? No    R Recommendations: See Admitting Provider Note  Report given to:   Additional Notes: Pt is AOX4, has been noncompliant with home medications, states they make his symptoms worse, has a dry cough, took PRN tessalon pearl, is hungry, has ambulated to the restroom, able to verbalize needs

## 2023-02-18 NOTE — ED Notes (Signed)
Patient denies pain and is resting comfortably.  

## 2023-02-18 NOTE — Interval H&P Note (Signed)
History and Physical Interval Note:  02/18/2023 1:50 PM  Jimmy Parker  has presented today for surgery, with the diagnosis of nstemi.  The various methods of treatment have been discussed with the patient and family. After consideration of risks, benefits and other options for treatment, the patient has consented to  Procedure(s): RIGHT/LEFT HEART CATH AND CORONARY ANGIOGRAPHY (N/A) as a surgical intervention.  The patient's history has been reviewed, patient examined, no change in status, stable for surgery.  I have reviewed the patient's chart and labs.  Questions were answered to the patient's satisfaction.    Cath Lab Visit (complete for each Cath Lab visit)  Clinical Evaluation Leading to the Procedure:   ACS: Yes.    Non-ACS:    Anginal Classification: CCS IV  Anti-ischemic medical therapy: No Therapy  Non-Invasive Test Results: No non-invasive testing performed  Prior CABG: No previous CABG       Theron Arista [facility] 02/18/2023 1:50 PM

## 2023-02-18 NOTE — ED Notes (Signed)
Call from lab for hemolysed chemistry sample

## 2023-02-18 NOTE — Progress Notes (Signed)
PHARMACY - ANTICOAGULATION CONSULT NOTE  Pharmacy Consult for heparin Indication:  NSTEMI  Labs: Recent Labs    02/17/23 1745 02/17/23 2209 02/18/23 0022 02/18/23 0550  HGB 14.9  --  14.1  --   HCT 46.8  --  44.1  --   PLT 179  --  174  --   HEPARINUNFRC  --   --  0.34 <0.10*  CREATININE 1.10  --   --   --   TROPONINIHS  --  2,740*  --   --     Assessment: 51yo male subtherapeutic on heparin with initial dosing for NSTEMI; no infusion issues or signs of bleeding per RN.  Goal of Therapy:  Heparin level 0.3-0.7 units/ml   Plan:  3000 units heparin bolus. Increase heparin infusion by 4 units/kg/hr to 1050 units/hr. Check level in 6 hours.   Vernard Gambles, PharmD, BCPS 02/18/2023 6:38 AM

## 2023-02-18 NOTE — ED Notes (Signed)
Called lab to check why sample not in process, they state sample not received. Sample send via tube system in green zone at 0435

## 2023-02-18 NOTE — ED Notes (Signed)
Patient left the floor AOX4, and  in stable condition with his belongings and staff.

## 2023-02-19 ENCOUNTER — Other Ambulatory Visit (HOSPITAL_COMMUNITY): Payer: Self-pay

## 2023-02-19 DIAGNOSIS — I214 Non-ST elevation (NSTEMI) myocardial infarction: Secondary | ICD-10-CM | POA: Diagnosis not present

## 2023-02-19 LAB — CBC
HCT: 45.4 % (ref 39.0–52.0)
Hemoglobin: 14.5 g/dL (ref 13.0–17.0)
MCH: 28.2 pg (ref 26.0–34.0)
MCHC: 31.9 g/dL (ref 30.0–36.0)
MCV: 88.3 fL (ref 80.0–100.0)
Platelets: 179 10*3/uL (ref 150–400)
RBC: 5.14 MIL/uL (ref 4.22–5.81)
RDW: 13.6 % (ref 11.5–15.5)
WBC: 6.8 10*3/uL (ref 4.0–10.5)
nRBC: 0 % (ref 0.0–0.2)

## 2023-02-19 LAB — BASIC METABOLIC PANEL
Anion gap: 10 (ref 5–15)
BUN: 14 mg/dL (ref 6–20)
CO2: 29 mmol/L (ref 22–32)
Calcium: 8.9 mg/dL (ref 8.9–10.3)
Chloride: 96 mmol/L — ABNORMAL LOW (ref 98–111)
Creatinine, Ser: 1.14 mg/dL (ref 0.61–1.24)
GFR, Estimated: >60 mL/min (ref >60)
Glucose, Bld: 151 mg/dL — ABNORMAL HIGH (ref 70–99)
Potassium: 3.6 mmol/L (ref 3.5–5.1)
Sodium: 135 mmol/L (ref 135–145)

## 2023-02-19 MED ORDER — EMPAGLIFLOZIN 10 MG PO TABS
10.0000 mg | ORAL_TABLET | Freq: Every day | ORAL | Status: DC
  Administered 2023-02-19 – 2023-02-21 (×3): 10 mg via ORAL
  Filled 2023-02-19 (×3): qty 1

## 2023-02-19 MED ORDER — DAPAGLIFLOZIN PROPANEDIOL 10 MG PO TABS: 10.0000 mg | ORAL_TABLET | Freq: Every day | ORAL | Status: DC

## 2023-02-19 MED ORDER — EMPAGLIFLOZIN 10 MG PO TABS
10.0000 mg | ORAL_TABLET | Freq: Every day | ORAL | 0 refills | Status: AC
  Filled 2023-02-19: qty 30, 30d supply, fill #0

## 2023-02-19 NOTE — Plan of Care (Signed)
  Problem: Cardiovascular: Goal: Ability to achieve and maintain adequate cardiovascular perfusion will improve Outcome: Progressing Goal: Vascular access site(s) Level 0-1 will be maintained Outcome: Completed/Met

## 2023-02-19 NOTE — Progress Notes (Addendum)
CARDIAC REHAB PHASE I   PRE:  Rate/Rhythm: 52 JR  BP:  Sitting: 126/71      SpO2: 100 RA  MODE:  Ambulation: 240 ft    POST:  Rate/Rhythm: 56 JR   BP:  Sitting: 134/83      SpO2: 100 RA  Pt ambulated with supervision assist, pt declined using RW to assist with amb. Pt was a little unsteady but able to walk w/o hands on assist. Pt was brought back to the room w/o c/p.  Pt was educated on stent card, stent location, Antiplatelet and ASA use, wt restrictions, no baths/daily wash-ups, s/s of infection, ex guidelines, s/s to stop exercising, NTG use and calling 911, heart healthy and diabetic diet, risk factors and CRPII. Pt received MI book and materials on exercise, diet, and CRPII. Will refer to [facility].   Pt does not believe he's diabetic. He does not like taking metformin. \  Faustino Congress  MS, ACSM-CEP 8:58 AM 02/19/2023    Service time is from 0810 to 0858.

## 2023-02-19 NOTE — Progress Notes (Signed)
   Patient Name: Jimmy Parker Date of Encounter: 02/19/2023 [facility] Cardiologist: Olga Millers, MD   Interval Summary  .    Walked in hallway earlier today without dyspnea or angina.  Feels tired.  Has some low thoracic back pain, different from his presenting angina (epigastric). Remains in junctional rhythm with rates around 50 bpm.  The blood pressure is normal.  Vital Signs .    Vitals:   02/18/23 2142 02/19/23 0210 02/19/23 0522 02/19/23 0741  BP:  117/61 123/62 118/63  Pulse: (!) 52 (!) 51 (!) 49 (!) 49  Resp:  16 18 16   Temp:  99 F (37.2 C) 98.4 F (36.9 C) 98.7 F (37.1 C)  TempSrc:  Oral Oral Oral  SpO2: 99% 99% 97% 98%  Weight:   63 kg   Height:        Intake/Output Summary (Last 24 hours) at 02/19/2023 1056 Last data filed at 02/18/2023 1900 Gross per 24 hour  Intake 192.27 ml  Output 1600 ml  Net -1407.73 ml      02/19/2023    5:22 AM 02/17/2023    5:43 PM 04/01/2022    9:48 AM  Last 3 Weights  Weight (lbs) 138 lb 14.4 oz 140 lb 147 lb  Weight (kg) 63.005 kg 63.504 kg 66.679 kg      Telemetry/ECG    Junctional rhythm 50 bpm.- Personally Reviewed  Physical Exam .   GEN: No acute distress.   Neck: No JVD Cardiac: RRR, no murmurs, rubs, or gallops.  Respiratory: Clear to auscultation bilaterally. GI: Soft, nontender, non-distended  MS: No edema  Assessment & Plan .     52 year old man with a history of CAD and previous NSTEMI 2017, mildly reduced LVEF 45-50%, hypertension, hyperlipidemia, ongoing tobacco abuse, untreated type 2 diabetes mellitus, presenting again with non-STEMI 02/17/2023, found to have 90% stenosis in the mid left circumflex coronary artery and a chronic total occlusion of the proximal RCA with left-to-right collaterals.  Received a drug-eluting stent to the left circumflex coronary artery.    Right heart cath showed normal left heart filling pressures and normal cardiac output with very mild increase in pulm artery  pressure.  Echo shows that LVEF is now lower at 35-40% and there is evidence of reduced right ventricular systolic function and right atrial dilatation, with moderate-severe tricuspid regurgitation  Has fair treatment of his LDL cholesterol with LDL of 75 on statin, but chronically low HDL of 36.  Triglycerides are normal.  Hemoglobin A1c is elevated at 7.1%.  He does not want to take metformin.  I think he would be a great candidate for SGLT2 inhibitor for both LV systolic dysfunction/heart failure and type 2 diabetes mellitus.  Not on beta-blockers due to junctional rhythm.  Strongly reinforced the critical importance of uninterrupted dual antiplatelet therapy.  For questions or updates, please contact [facility] Please consult [email or web address] for contact info under        Signed, Thurmon Fair, MD

## 2023-02-20 DIAGNOSIS — I214 Non-ST elevation (NSTEMI) myocardial infarction: Secondary | ICD-10-CM | POA: Diagnosis not present

## 2023-02-20 LAB — CBC
HCT: 43 % (ref 39.0–52.0)
Hemoglobin: 13.9 g/dL (ref 13.0–17.0)
MCH: 29 pg (ref 26.0–34.0)
MCHC: 32.3 g/dL (ref 30.0–36.0)
MCV: 89.6 fL (ref 80.0–100.0)
Platelets: 164 10*3/uL (ref 150–400)
RBC: 4.8 MIL/uL (ref 4.22–5.81)
RDW: 13.6 % (ref 11.5–15.5)
WBC: 5.5 10*3/uL (ref 4.0–10.5)
nRBC: 0 % (ref 0.0–0.2)

## 2023-02-20 MED ORDER — ATORVASTATIN CALCIUM 80 MG PO TABS
80.0000 mg | ORAL_TABLET | Freq: Every day | ORAL | Status: DC
  Administered 2023-02-21: 80 mg via ORAL
  Filled 2023-02-20: qty 1

## 2023-02-20 NOTE — Plan of Care (Signed)
  Problem: Clinical Measurements: Goal: Ability to maintain clinical measurements within normal limits will improve Outcome: Progressing Goal: Will remain free from infection Outcome: Progressing Goal: Diagnostic test results will improve Outcome: Progressing Goal: Respiratory complications will improve Outcome: Progressing Goal: Cardiovascular complication will be avoided Outcome: Completed/Met   Problem: Activity: Goal: Risk for activity intolerance will decrease Outcome: Progressing

## 2023-02-20 NOTE — Progress Notes (Signed)
Patient Name: Jimmy Parker Date of Encounter: 02/20/2023 [facility] Cardiologist: Olga Millers, MD   Interval Summary  .    Had an unusual interaction with him this morning. I told him that he is back in normal rhythm and he immediately told me that is not true, and that he is not going to leave the hospital " and die on the job".  I tried to gently redirect the conversation towards the facts of his cardiac condition, but he dismissed me.  Vital Signs .    Vitals:   02/19/23 1956 02/20/23 0415 02/20/23 0419 02/20/23 0500  BP: 136/74  134/77   Pulse: (!) 50  (!) 51 (!) 53  Resp: 16  16   Temp: 98.7 F (37.1 C)  98.1 F (36.7 C)   TempSrc: Oral  Oral   SpO2: 100%  100% 100%  Weight:  63.3 kg    Height:       No intake or output data in the 24 hours ending 02/20/23 1138    02/20/2023    4:15 AM 02/19/2023    5:22 AM 02/17/2023    5:43 PM  Last 3 Weights  Weight (lbs) 139 lb 8 oz 138 lb 14.4 oz 140 lb  Weight (kg) 63.277 kg 63.005 kg 63.504 kg      Telemetry/ECG    Mild sinus bradycardia- Personally Reviewed  Physical Exam .   Unable to examine him  Assessment & Plan .     52 year old gentleman with early onset CAD, previous non-STEMI 2017, now presenting with another non-STEMI and further reduction in LV systolic function (EF 35-40%) also complicated by junctional rhythm.  He received a drug-eluting stent to the culprit left circumflex artery and was also noted to have a chronic total occlusion of the right coronary artery that is not amenable to revascularization.  He has not had overt congestive heart failure but has moderately reduced LVEF.  His rhythm has returned to normal, but he has not been given beta-blockers due to junctional rhythm.  He is still relatively bradycardic.  Suspect that the heart rate will continue to improve as postinfarction inflammation/stunning resolves.  Beta-blockers can be initiated at follow-up visit.  Unfortunately, I was  not able to sit down with him and go over his long-term care and prognosis in any detail.  It is critically important that he continues to take dual antiplatelet therapy with aspirin and Brilinta for at least 6 months, preferably for 12 months.  After that the Brilinta can be discontinued and he should remain on long-term aspirin therapy.  Although he does not have severe hyperlipidemia, it is also important for him to be on high intensity statin for at least the next 12 months.  Long-term would recommend target LDL cholesterol less than 55.  He also refuses to acknowledge that he has diabetes mellitus and does not want to take metformin.  Yesterday I recommended long-term treatment with an SGLT2 inhibitor which will help both with his glycemic control and help treat/prevent heart failure exacerbation.  I did not have the opportunity to go over this with him again today.  Our pharmacist have kindly prepared patient assistance application paperwork so that he can get Jardiance as an outpatient (both [place] and Marcelline Deist would be expensive, but the [place] is more likely to be accessible via assistance programs).  In addition, long-term he would benefit from RAAS inhibitors to protect both LV function and renal function.  Would prefer Entresto if this  is accessible, if not at least an angiotensin receptor blocker.  We have reviewed the fact that smoking cessation is critical for long-term prognosis.  [facility] will sign off.   Medication Recommendations: Aspirin 81 mg daily, Brilinta 90 mg twice daily, Jardiance 10 mg daily, atorvastatin 80 mg daily. Also recommend starting losartan 25 mg daily, with a plan to titrate upwards and fully transition to [facility] as an outpatient. Other recommendations (labs, testing, etc): Smoking cessation. Follow up as an outpatient: We will make arrangements for follow-up in a couple of weeks.    For questions or updates, please contact [facility] Please consult [email or web address] for contact info under        Signed, Thurmon Fair, MD

## 2023-02-20 NOTE — Progress Notes (Signed)
  CSW was alerted that pt wanted to speak to CSW. CSW met pt at bedside and he inquired about social security benefits. He asked if a MD at the hospital could approve his SS. CSW explained that he would need to follow up with social security. He stated that he has been denied in the past and contracted with a Clinical research associate. CSW informed pt to follow up again with social security and his lawyer to check the status.

## 2023-02-21 ENCOUNTER — Other Ambulatory Visit (HOSPITAL_COMMUNITY): Payer: Self-pay

## 2023-02-21 ENCOUNTER — Encounter (HOSPITAL_COMMUNITY): Payer: Self-pay | Admitting: Cardiology

## 2023-02-21 ENCOUNTER — Encounter: Payer: Self-pay | Admitting: Cardiology

## 2023-02-21 ENCOUNTER — Telehealth (HOSPITAL_COMMUNITY): Payer: Self-pay

## 2023-02-21 DIAGNOSIS — Z955 Presence of coronary angioplasty implant and graft: Secondary | ICD-10-CM | POA: Diagnosis not present

## 2023-02-21 DIAGNOSIS — I5023 Acute on chronic systolic (congestive) heart failure: Secondary | ICD-10-CM

## 2023-02-21 DIAGNOSIS — I255 Ischemic cardiomyopathy: Secondary | ICD-10-CM

## 2023-02-21 DIAGNOSIS — R7989 Other specified abnormal findings of blood chemistry: Secondary | ICD-10-CM

## 2023-02-21 DIAGNOSIS — I214 Non-ST elevation (NSTEMI) myocardial infarction: Secondary | ICD-10-CM | POA: Diagnosis not present

## 2023-02-21 DIAGNOSIS — E1159 Type 2 diabetes mellitus with other circulatory complications: Secondary | ICD-10-CM

## 2023-02-21 DIAGNOSIS — I10 Essential (primary) hypertension: Secondary | ICD-10-CM

## 2023-02-21 DIAGNOSIS — Z720 Tobacco use: Secondary | ICD-10-CM

## 2023-02-21 DIAGNOSIS — R1010 Upper abdominal pain, unspecified: Secondary | ICD-10-CM

## 2023-02-21 LAB — LIPOPROTEIN A (LPA): Lipoprotein (a): 85.5 nmol/L — ABNORMAL HIGH (ref <75.0)

## 2023-02-21 MED ORDER — ASPIRIN 81 MG PO CHEW
81.0000 mg | CHEWABLE_TABLET | Freq: Every day | ORAL | 2 refills | Status: AC
  Filled 2023-02-21 – 2023-03-23 (×2): qty 30, 30d supply, fill #0
  Filled 2023-03-23: qty 30, 30d supply, fill #1

## 2023-02-21 MED ORDER — NITROGLYCERIN 0.4 MG SL SUBL
0.4000 mg | SUBLINGUAL_TABLET | SUBLINGUAL | 2 refills | Status: DC | PRN
  Filled 2023-02-21: qty 25, 8d supply, fill #0

## 2023-02-21 MED ORDER — ATORVASTATIN CALCIUM 80 MG PO TABS
80.0000 mg | ORAL_TABLET | Freq: Every day | ORAL | 1 refills | Status: DC
  Filled 2023-02-21 – 2023-03-23 (×2): qty 30, 30d supply, fill #0
  Filled 2023-03-23: qty 30, 30d supply, fill #1

## 2023-02-21 MED ORDER — LOSARTAN POTASSIUM 25 MG PO TABS
12.5000 mg | ORAL_TABLET | Freq: Every day | ORAL | Status: DC
  Administered 2023-02-21: 12.5 mg via ORAL
  Filled 2023-02-21: qty 1

## 2023-02-21 MED ORDER — PRASUGREL HCL 10 MG PO TABS
10.0000 mg | ORAL_TABLET | Freq: Every day | ORAL | 11 refills | Status: DC
  Filled 2023-02-21 – 2023-03-23 (×2): qty 30, 30d supply, fill #0
  Filled 2023-03-23: qty 30, 30d supply, fill #1

## 2023-02-21 MED ORDER — LOSARTAN POTASSIUM 25 MG PO TABS
12.5000 mg | ORAL_TABLET | Freq: Every day | ORAL | 0 refills | Status: DC
  Filled 2023-02-21: qty 15, 30d supply, fill #0

## 2023-02-21 MED ORDER — ALUM & MAG HYDROXIDE-SIMETH 200-200-20 MG/5ML PO SUSP
30.0000 mL | Freq: Once | ORAL | Status: AC
  Administered 2023-02-21: 30 mL via ORAL
  Filled 2023-02-21: qty 30

## 2023-02-21 MED ORDER — TICAGRELOR 90 MG PO TABS
90.0000 mg | ORAL_TABLET | Freq: Two times a day (BID) | ORAL | 2 refills | Status: DC
  Filled 2023-02-21: qty 60, 30d supply, fill #0

## 2023-02-21 MED ORDER — PRASUGREL HCL 10 MG PO TABS: 10.0000 mg | ORAL_TABLET | Freq: Every day | ORAL | Status: DC

## 2023-02-21 MED ORDER — PRASUGREL HCL 10 MG PO TABS
60.0000 mg | ORAL_TABLET | Freq: Once | ORAL | Status: AC
  Administered 2023-02-21: 60 mg via ORAL
  Filled 2023-02-21: qty 6

## 2023-02-21 NOTE — TOC Transition Note (Addendum)
Transition of Care [facility]) - CM/SW Discharge Note   Patient Details  Name: Jimmy Parker MRN: [id number] Date of Birth: 08-09-1970  [facility] CM/SW Contact:  Gordy Clement, RN Phone Number: 02/21/2023, 1:08 PM   Clinical Narrative:    Patient to DC to home today  No TOC needs have been identified. Patient will follow up as instructed on AVS   Rolling walker will be delivered to home per patient request          Patient Goals and CMS Choice      Discharge Placement                         Discharge Plan and Services Additional resources added to the After Visit Summary for                                       Social Determinants of Health (SDOH) Interventions SDOH Screenings   Food Insecurity: No Food Insecurity (02/21/2023)  Housing: Low Risk  (02/21/2023)  Transportation Needs: No Transportation Needs (02/21/2023)  Utilities: Not At Risk (02/18/2023)  Alcohol Screen: Low Risk  (02/21/2023)  Depression (PHQ2-9): Low Risk  (10/19/2021)  Financial Resource Strain: Low Risk  (02/21/2023)  Tobacco Use: Medium Risk (02/17/2023)     Readmission Risk Interventions     No data to display

## 2023-02-21 NOTE — Discharge Summary (Addendum)
Discharge Summary    Patient ID: YEABSIRA CASELLI MRN: [id number]; DOB: 1970/11/16  Admit date: 02/17/2023 Discharge date: 02/21/2023  PCP:  Marcine Matar, MD    HeartCare Providers Cardiologist:  Olga Millers, MD     Discharge Diagnoses    Principal Problem:   NSTEMI (non-ST elevated myocardial infarction) [facility]) Active Problems:   Diabetes mellitus (HCC)   HYPERTENSION, BENIGN ESSENTIAL   Ischemic cardiomyopathy   Tobacco abuse   Acute on chronic systolic heart failure (HCC)   Elevated liver function tests   S/P coronary artery stent placement   Upper abdominal pain  Diagnostic Studies/Procedures    Cath: 02/18/2023 2 vessel obstructive CAD. 90% mid LCx. 100% proximal to mid RCA CTO with left to right collaterals. Moderate LV dysfunction. EF estimated at 40%. Global dysfunction Normal LV filling pressures. LVEDP 9 mm Hg. PCWP 19/18 with mean 15 mm Hg Normal PAP 44/12, mean 21 mm Hg Reduced cardiac output. 3.78 L/min index 2.17 Successful PCI of the mid LCx with DES Plan: DAPT for one year. Anticipate DC tomorrow. Optimize medical therapy for CHF. RCA occlusion is not suited for CTO PCI- treat medically    Diagnostic       Intervention Dominance: Right       Echo: 02/18/2023: LVEF estimated 35 to 40%.  Moderate reduced with global HK.  Mild LVH.  Indeterminate diastolic parameters.  Mildly reduced RV function with mildly elevated PAP.  Moderate RA dilation with normal RAP.  Moderate to severe TR.  _____________   History of Present Illness     Jimmy Parker is a 52 y.o. male with obstructive CAD (NSTEMI 2017), ischemic cardiomyopathy, DM 2, hypertension, hyperlipidemia, tobacco dependence who was seen 02/18/2023 for the evaluation of epigastric pain.   Jimmy Parker reported that he was in his usual state of health until Monday 11/4. He works in Production designer, theatre/television/film.  On the evening of Monday prior to admission, he noted epigastric discomfort with associated fatigue.  He  felt that his symptoms migrated to his lower chest at times.  He also developed dyspnea with minimal exertion as well as orthopnea.  None of the above symptoms were present at any point in the recent past.  Given the symptoms, he missed work for 48 hours.  He attempted going back to work after 48 hours but was unable to given persistent the symptoms.  He had persistent nausea no vomiting, though he is spitting up his saliva.  Has felt chills though no fevers.  Some loose stools.  No history of cholecystectomy or appendectomy.  His previous MI symptoms in 2017 (further details below) were very similar which is what ultimately prompted him to present to the emergency room.   On arrival to the emergency room, Tmax 99.9, HR 50-60s, BP 110s-140s/70s-80s, not requiring supplemental oxygen.  BMP essentially unremarkable.  LFT derangements including AST 93, ALT 179, T. bili 1.6.  CBC completely unremarkable.  Flu, COVID, RSV negative.  UA unremarkable.  EKG with junctional bradycardia and T wave inversions in V4-V6 with T wave flattening in 1 and aVL, no ST segment changes.  High sensitivity troponin thus far at 2740, no repeat yet.  CXR essentially with clear lungs.  Right upper quadrant ultrasound ordered with results pending.  Received aspirin 324 mg and started on a heparin drip for NSTEMI.   In terms of relevant cardiovascular history, slightly NSTEMI in March 2017.  Cardiac catheterization revealed complete occlusion of the mid RCA with distal filling  via left-to-right collaterals.  Also noted was mild nonobstructive disease in the LAD and circumflex artery.  Given that his infarct had happened over 48 hours prior to Cath Lab presentation and good collaterals already present, decision was made to not attempt PCI and focus on aggressive risk factor modification.  Echo at that time with LVEF 45-50% with inferior hypokinesis.   Stated that he started smoking again about a year ago.  Has not taken any of his  prescription medications for over a year as he did not think he did not need them, including metformin, antihypertensive medications and aspirin.  Drinks around 1 alcoholic beverage per week.  Works in maintenance currently.   Hospital Course     NSTEMI -- High-sensitivity troponin peaked at 2740.  Underwent cardiac catheterization 11/8 with two-vessel obstructive CAD with CTO of mid RCA with left-to-right collaterals, 90% mid circumflex treated with PCI/DES x 1.  Recommendations for DAPT with aspirin/Brilinta for at least 1 year. Noted that Brilinta would a cost issue. He was switched to Effient and loaded prior to discharge. -- Seen by cardiac rehab -- Continue DAPT with aspirin/Effient, atorvastatin 80 mg daily, add losartan 12.5mg  daily   HFrEF ICM -- Echo showed LVEF of 35 to 40%, global hypokinesis, moderately reduced RV, moderately dilated right atrium, moderate to severe TR -- LVEDP 9 mmHg on cath -- GDMT: Unable to add beta-blocker therapy with baseline bradycardia, continue Jardiance, add losartan 12.5 mg daily. Consider transition to [facility] outpatient if BP able to tolerate   Diabetes -- Hgb A1c 7.1 -- Reports he has not tolerated metformin in the past, started on Jardiance this admission  Hyperlipidemia -- LDL 75, HDL 36 -- Continue atorvastatin 80 mg daily -- Will need LFT/FLP in 8 weeks  Abd pain Elevated LFTs -- Ultrasound with gallbladder wall thickening which was nonspecific in the setting of ascites, chronic liver disease.  Increased hepatic echogenicity with capsular nodularity suspicious for cirrhosis -- follow up with PCP outpatient  Tobacco abuse -- Cessation advised  General: Well developed, well nourished, male appearing in no acute distress. Head: Normocephalic, atraumatic.  Neck: Supple without bruits, JVD. Lungs:  Resp regular and unlabored, CTA. Heart: RRR, S1, S2, no S3, S4, or murmur; no rub. Abdomen: Soft, non-tender, non-distended with  normoactive bowel sounds. No hepatomegaly. No rebound/guarding. No obvious abdominal masses. Extremities: No clubbing, cyanosis, edema. Distal pedal pulses are 2+ bilaterally. Right radial cath site stable without bruising or hematoma Neuro: Alert and oriented X 3. Moves all extremities spontaneously. Psych: Normal affect.  Patient was seen by Dr. Herbie Baltimore and deemed stable for discharge home.  Follow-up arranged in the office.  Medication sent to [facility] pharmacy prior to discharge.  Educated by [employer].D.   Did the patient have an acute coronary syndrome (MI, NSTEMI, STEMI, etc) this admission?:  Yes                               AHA/ACC ACS Clinical Performance & Quality Measures: Aspirin prescribed? - Yes ADP Receptor Inhibitor (Plavix/Clopidogrel, Brilinta/Ticagrelor or Effient/Prasugrel) prescribed (includes medically managed patients)? - Yes Beta Blocker prescribed? - No - bradycardia High Intensity Statin (Lipitor 40-80mg  or Crestor 20-40mg ) prescribed? - Yes EF assessed during THIS hospitalization? - Yes For EF <40%, was ACEI/ARB prescribed? - Yes For EF <40%, Aldosterone Antagonist (Spironolactone or Eplerenone) prescribed? - No - Reason:  consider at outpatient follow up appt Cardiac Rehab Phase II ordered (including medically  managed patients)? - Yes   The patient will be scheduled for a TOC follow up appointment in 10-14 days.  A message has been sent to the [facility] and Scheduling Pool at the office where the patient should be seen for follow up.  _____________  Discharge Vitals Blood pressure (!) 153/72, pulse (!) 54, temperature 98.5 F (36.9 C), temperature source Oral, resp. rate 18, height 5\' 7"  (1.702 m), weight 64.2 kg, SpO2 100%.  Filed Weights   02/19/23 0522 02/20/23 0415 02/21/23 0723  Weight: 63 kg 63.3 kg 64.2 kg    Labs & Radiologic Studies    CBC Recent Labs    02/19/23 0326 02/20/23 0404  WBC 6.8 5.5  HGB 14.5 13.9  HCT 45.4 43.0  MCV 88.3 89.6  PLT  179 164   Basic Metabolic Panel Recent Labs    98/11/91 1425 02/19/23 0326  NA 138 135  K 3.6 3.6  CL  --  96*  CO2  --  29  GLUCOSE  --  151*  BUN  --  14  CREATININE  --  1.14  CALCIUM  --  8.9   Liver Function Tests No results for input(s): "AST", "ALT", "ALKPHOS", "BILITOT", "PROT", "ALBUMIN" in the last 72 hours. No results for input(s): "LIPASE", "AMYLASE" in the last 72 hours. High Sensitivity Troponin:   Recent Labs  Lab 02/17/23 2209 02/18/23 0550  TROPONINIHS 2,740* 2,053*    BNP Invalid input(s): "POCBNP" D-Dimer No results for input(s): "DDIMER" in the last 72 hours. Hemoglobin A1C No results for input(s): "HGBA1C" in the last 72 hours. Fasting Lipid Panel No results for input(s): "CHOL", "HDL", "LDLCALC", "TRIG", "CHOLHDL", "LDLDIRECT" in the last 72 hours. Thyroid Function Tests No results for input(s): "TSH", "T4TOTAL", "T3FREE", "THYROIDAB" in the last 72 hours.  Invalid input(s): "FREET3" _____________  DG Chest Port 1 View  Result Date: 02/18/2023 CLINICAL DATA:  Pain. EXAM: PORTABLE CHEST 1 VIEW COMPARISON:  Radiograph 11/06/2022 FINDINGS: Unchanged cardiomegaly. Stable mediastinal contours. No pulmonary edema. No focal airspace disease. No pleural effusion or pneumothorax. Mild limited assessment, no acute osseous findings. IMPRESSION: Unchanged cardiomegaly. No acute chest findings. Electronically Signed   By: Narda Rutherford M.D.   On: 02/18/2023 00:21   US Abdomen Limited RUQ (LIVER/GB)  Result Date: 02/18/2023 CLINICAL DATA:  Abdominal pain. EXAM: ULTRASOUND ABDOMEN LIMITED RIGHT UPPER QUADRANT COMPARISON:  11/06/2022 FINDINGS: Gallbladder: Partially distended. No gallstones. Borderline gallbladder wall thickening is nonspecific in this setting. No sonographic Murphy sign noted by sonographer. Common bile duct: Diameter: 3 mm, normal Liver: No focal lesion identified. Increased in parenchymal echogenicity. Subtle capsular nodularity. Portal vein  is patent on color Doppler imaging with normal direction of blood flow towards the liver. Other: Trace perihepatic ascites and fluid in Morrison's pouch. IMPRESSION: 1. No gallstones. Borderline gallbladder wall thickening is nonspecific in the setting of ascites and chronic liver disease. 2. Increased hepatic echogenicity with subtle capsular nodularity, suspicious for cirrhosis. 3. Trace perihepatic ascites and fluid in Morrison's pouch. 4. No biliary dilatation. Electronically Signed   By: Narda Rutherford M.D.   On: 02/18/2023 00:20   Disposition   Pt is being discharged home today in good condition.  Follow-up Plans & Appointments     Follow-up Information     [facility]. Go in 11 day(s).   Specialty: Cardiology Why: Hospital follow up 03/04/2023 @ 2 pm PLEASE bribng a current medication list to appointment FREE valet parking, Entrance C, off  [address] [employer] information: [address] [place] [address] [phone]               Discharge Instructions     Amb Referral to Cardiac Rehabilitation   Complete by: As directed    Diagnosis:  Coronary Stents Heart Failure (see criteria below if ordering Phase II) NSTEMI     Heart Failure Type: Chronic Systolic   After initial evaluation and assessments completed: Virtual Based Care may be provided alone or in conjunction with Phase 2 Cardiac Rehab based on patient barriers.: Yes   Intensive Cardiac Rehabilitation (ICR) [facility] location only OR Traditional Cardiac Rehabilitation (TCR) *If criteria for ICR are not met will enroll in TCR [facility] only): Yes   Call MD for:  difficulty breathing, headache or visual disturbances   Complete by: As directed    Call MD for:  redness, tenderness, or signs of infection (pain, swelling, redness, odor or green/yellow discharge around incision site)   Complete by: As directed    Diet - low sodium heart healthy   Complete by:  As directed    Discharge instructions   Complete by: As directed    Radial Site Care Refer to this sheet in the next few weeks. These instructions provide you with information on caring for yourself after your procedure. Your caregiver may also give you more specific instructions. Your treatment has been planned according to current medical practices, but problems sometimes occur. Call your caregiver if you have any problems or questions after your procedure. HOME CARE INSTRUCTIONS You may shower the day after the procedure. Remove the bandage (dressing) and gently wash the site with plain soap and water. Gently pat the site dry.  Do not apply powder or lotion to the site.  Do not submerge the affected site in water for 3 to 5 days.  Inspect the site at least twice daily.  Do not flex or bend the affected arm for 24 hours.  No lifting over 5 pounds (2.3 kg) for 5 days after your procedure.  Do not drive home if you are discharged the same day of the procedure. Have someone else drive you.  You may drive 24 hours after the procedure unless otherwise instructed by your caregiver.  What to expect: Any bruising will usually fade within 1 to 2 weeks.  Blood that collects in the tissue (hematoma) may be painful to the touch. It should usually decrease in size and tenderness within 1 to 2 weeks.  SEEK IMMEDIATE MEDICAL CARE IF: You have unusual pain at the radial site.  You have redness, warmth, swelling, or pain at the radial site.  You have drainage (other than a small amount of blood on the dressing).  You have chills.  You have a fever or persistent symptoms for more than 72 hours.  You have a fever and your symptoms suddenly get worse.  Your arm becomes pale, cool, tingly, or numb.  You have heavy bleeding from the site. Hold pressure on the site.   PLEASE DO NOT MISS ANY DOSES OF YOUR EFFIENT!!!!! Also keep a log of you blood pressures and bring back to your follow up appt. Please call the  office with any questions.   Patients taking blood thinners should generally stay away from medicines like ibuprofen, Advil, Motrin, naproxen, and Aleve due to risk of stomach bleeding. You may take Tylenol as directed or talk to your primary doctor about alternatives.  PLEASE ENSURE THAT YOU DO NOT RUN  OUT OF YOUR EFFIENT. This medication is very important to remain on for at least one year. IF you have issues obtaining this medication due to cost please CALL the office 3-5 business days prior to running out in order to prevent missing doses of this medication.   Increase activity slowly   Complete by: As directed         Discharge Medications   Allergies as of 02/21/2023   No Known Allergies      Medication List     TAKE these medications    Aspirin Low Dose 81 MG chewable tablet Generic drug: aspirin Chew 1 tablet (81 mg total) by mouth daily.   atorvastatin 80 MG tablet Commonly known as: LIPITOR Take 1 tablet (80 mg total) by mouth daily.   Jardiance 10 MG Tabs tablet Generic drug: empagliflozin Take 1 tablet (10 mg total) by mouth daily.   losartan 25 MG tablet Commonly known as: COZAAR Take 0.5 tablets (12.5 mg total) by mouth daily.   nicotine polacrilex 2 MG gum Commonly known as: NICORETTE Take 1 each (2 mg total) by mouth as needed for smoking cessation.   nitroGLYCERIN 0.4 MG SL tablet Commonly known as: NITROSTAT Place 1 tablet (0.4 mg total) under the tongue every 5 (five) minutes as needed.   prasugrel 10 MG Tabs tablet Commonly known as: EFFIENT Take 1 tablet (10 mg total) by mouth daily.        Outstanding Labs/Studies   CMET at follow up appt   Duration of Discharge Encounter   Greater than 30 minutes including physician time.  Signed, Laverda Page, NP 02/21/2023, 12:39 PM   ATTENDING ATTESTATION  I have seen, examined and evaluated the patient this morning on rounds along with Laverda Page, NP.  After reviewing all the  available data and chart, we discussed the patients laboratory, study & physical findings as well as symptoms in detail.  I agree with her findings, examination as well as impression recommendations as per our discussion.    Patient admitted with epigastric discomfort and troponin elevation concerning for non-STEMI.  Underwent single-vessel PCI with the RCA being treated medically.  Has been moderate ischemic cardiomyopathy but no heart failure symptoms.  This morning he noticed abdominal pain and so he was given simethicone/Maalox cocktail with improvement in symptoms.  Was converted from Brilinta to prasugrel for cost issues and concerns of breathing issues.  Remains on aspirin and prasugrel.  On high-dose atorvastatin 80 mg along with Jardiance 10 mg and losartan 25 mg  Okay for discharge home.    Marykay Lex, MD, MS Bryan Lemma, M.D., M.S. Interventional Cardiologist  [facility]  Pager # [phone] Phone # [phone] [address] [place], [place] [address]

## 2023-02-21 NOTE — TOC Benefit Eligibility Note (Signed)
Patient Product/process development scientist completed.    The patient is insured through [employer]. Patient has [employer], may use a copay card, and/or apply for patient assistance if available.    Ran test claim for Brilinta 90 mg and the current 30 day co-pay is $405.82 due to a $7500 deductible.  Ran test claim for Jardiance 10 mg and the current 30 day co-pay is $549.62 due to a $7500 deductible.  This test claim was processed through [facility]- copay amounts may vary at other pharmacies due to pharmacy/plan contracts, or as the patient moves through the different stages of their insurance plan.     Roland Earl, CPHT Pharmacy Technician III Certified Patient Advocate [facility] Patient Advocate Team Direct Number: [phone]  Fax: [phone]

## 2023-02-21 NOTE — Evaluation (Signed)
Physical Therapy Evaluation Patient Details Name: Jimmy Parker MRN: [id number] DOB: 1970/10/17 Today's Date: 02/21/2023  History of Present Illness  52 y.o. male who presented to ED 02/18/2023 for the evaluation of epigastric pain. Found to have elevated troponins Cardiac catheterization revealed complete occlusion of the mid RCA with distal filling via left-to-right collaterals and mild nonobstructive disease in the LAD and circumflex artery. Admitted for treatment of NSTEMI type I and acute decompensated heart failure. PMH: obstructive CAD (NSTEMI 2017), ischemic cardiomyopathy, DM 2, hypertension, hyperlipidemia, tobacco dependence  Clinical Impression  Prior to session RN reported that pt had been up to shower without telling staff and was able to complete without assist. Pt provided limited information about home set up, only willing to report he lives in a boarding house, and there are normally people around. 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 Pt reports his knees are swollen and painful this morning and they are limiting his ability get around. During exam sitting EoB, pt reports he is unable to move his hips, knees or ankles due to pain. Pt able to come to standing at RW and walk to door, refuses to attempt walking without RW without wheelchair follow. Pt inquires about how long he will be out of work advised him to speak to his physician. Suggested that pain in knees may be OA and they are sore because he did not move around all weekend, but with out physical exam unable to hypothesize more than that. Encouraged pt to move more. Recommended RW at discharge if pain in great, pt unsure whether he will accept. PT referral to Mobility Specialist. PT signing off.         If plan is discharge home, recommend the following: Assist for transportation;Help with stairs or ramp for entrance   Can  travel by private vehicle    Yes    Equipment Recommendations Rolling walker (2 wheels)     Functional Status Assessment Patient has had a recent decline in their functional status and demonstrates the ability to make significant improvements in function in a reasonable and predictable amount of time.     Precautions / Restrictions Precautions Precautions: None Restrictions Weight Bearing Restrictions: No      Mobility  Bed Mobility Overal bed mobility: Independent                  Transfers Overall transfer level: Modified independent                 General transfer comment: vc for hand placement for power up to RW, pt continues to pull up on RW    Ambulation/Gait Ambulation/Gait assistance: Modified independent (Device/Increase time) Gait Distance (Feet): 20 Feet Assistive device: Rolling walker (2 wheels) Gait Pattern/deviations: WFL(Within Functional Limits), Step-through pattern Gait velocity: WFL Gait velocity interpretation: <1.8 ft/sec, indicate of risk for recurrent falls   General Gait Details: supervision for safety, pt refuses to attempt ambulation without RW without having a wheelchair follow him, offered a straight back chair for seated rest break which he refused, ambulated back to recliner         Balance Overall balance assessment: Modified Independent  Pertinent Vitals/Pain Pain Assessment Pain Assessment: Faces Faces Pain Scale: Hurts little more Pain Location: bilateral knees Pain Descriptors / Indicators: Aching, Tightness, Sore, Grimacing, Guarding Pain Intervention(s): Limited activity within patient's tolerance, Monitored during session, Repositioned    Home Living Family/patient expects to be discharged to:: Private residence [facility]) Living Arrangements: Alone   Type of Home: House             Additional Comments: lives in boarding house, pt  reports he does not want to provide any more informantion than that    Prior Function Prior Level of Function : Independent/Modified Independent (works as a Scientist, 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, Scientific laboratory technician)                     Extremity/Trunk Assessment   Upper Extremity Assessment Upper Extremity Assessment: Overall WFL for tasks assessed    Lower Extremity Assessment Lower Extremity Assessment: RLE deficits/detail;LLE deficits/detail RLE Deficits / Details: pt report pain in knees will not allow him to move hips, knees or ankles for ROM or MMT, knee is noted to have minimal swelling LLE Deficits / Details: pt report pain in knees will not allow him to move hips, knees or ankles for ROM or MMT, knee is noted to have minimal swelling    Cervical / Trunk Assessment Cervical / Trunk Assessment: Normal  Communication   Communication Communication: No apparent difficulties  Cognition Arousal: Alert Behavior During Therapy: Flat affect Overall Cognitive Status: Within Functional Limits for tasks assessed                                 General Comments: pt with inconsistent presentation of AROM and sensation        General Comments General comments (skin integrity, edema, etc.): VSS on RA, educated on likelihood of OA in knees and with decreased mobilization over the weekend likely are painful with movement, encouraged more activity     Assessment/Plan    PT Assessment Patient needs continued PT services;Patient does not need any further PT services  PT Problem List Decreased strength;Decreased activity tolerance;Decreased mobility;Pain       PT Treatment Interventions DME instruction;Gait training;Functional mobility training;Therapeutic activities;Therapeutic exercise;Balance training;Cognitive remediation;Patient/family education    PT Goals (Current goals can be found in the Care Plan section)  Acute Rehab PT Goals PT Goal Formulation: With  patient Time For Goal Achievement: 03/07/23 Potential to Achieve Goals: Good    Frequency Min 1X/week        AM-PAC PT "6 Clicks" Mobility  Outcome Measure Help needed turning from your back to your side while in a flat bed without using bedrails?: None Help needed moving from lying on your back to sitting on the side of a flat bed without using bedrails?: None Help needed moving to and from a bed to a chair (including a wheelchair)?: A Little Help needed standing up from a chair using your arms (e.g., wheelchair or bedside chair)?: A Little Help needed to walk in hospital room?: None Help needed climbing 3-5 steps with a railing? : None 6 Click Score: 22    End of Session Equipment Utilized During Treatment: Gait belt Activity Tolerance: Patient limited by pain (in knees) Patient left: in chair;with call bell/phone within reach Nurse Communication: Mobility status PT Visit Diagnosis: Pain Pain - Right/Left:  (bilateral) Pain - part of body: Knee    Time: 2956-2130 PT Time Calculation (min) (  ACUTE ONLY): 25 min   Charges:   PT Evaluation $PT Eval Low Complexity: 1 Low PT Treatments $Therapeutic Activity: 8-22 mins PT General Charges $$ ACUTE PT VISIT: 1 Visit         Kerstie Agent B. Beverely Risen PT, DPT Acute Rehabilitation Services Please use secure chat or  Call Office [phone]   Elon Alas Fleet 02/21/2023, 10:08 AM

## 2023-02-21 NOTE — Progress Notes (Signed)
CARDIAC REHAB PHASE I    Stopped by to offer walk. Pt working with PT at the time. Post MI/stent education completed. Referral to [facility] for CRP2 in place.        Woodroe Chen, RN BSN 02/21/2023 10:28 AM

## 2023-02-21 NOTE — Progress Notes (Signed)
   Heart Failure Stewardship Pharmacist Progress Note   PCP: Marcine Matar, MD PCP-Cardiologist: Olga Millers, MD    HPI:  52 yo M with PMH of CAD, ICM, T2DM, HTN, HLD, and tobacco use.   Presented to the ED on 11/7 with dyspnea, orthopnea, epigastric discomfort, fatigue, and chest discomfort. EKG with junctional bradycardia and T wave inversions, no ST changes. Hs trop 1610>9604. CXR unremarkable. Reports he hasn't taken any medication in over a year. ECHO 11/8 with EF 35-40%, global hypokinesis, mild LVH, RV moderately reduced. Taken for [facility] on 11/8 and found to have two vessel obstructive CAD (90% LCx and 100% prox-mid RCA with left to right collaterals). S/p PCI of LCx with DES. RA 11, PA 21, wedge 15, CO 3.8, CI 2.2.   Reports some shortness of breath and epigastric pain. Denies lightheadedness or dizziness.   Current HF Medications: ACE/ARB/ARNI: losartan 12.5 mg daily SGLT2i: Jardiance 10 mg daily  Prior to admission HF Medications: None  Pertinent Lab Values: Serum creatinine 1.14, BUN 14, Potassium 3.6, Sodium 135, BNP 263.4, Magnesium 2.2, A1c 7.1   Vital Signs: Weight: 139 lbs (admission weight: 140 lbs) Blood pressure: 120-130/70s  Heart rate: 50s  I/O: incomplete  Medication Assistance / Insurance Benefits Check: Does the patient have prescription insurance?  Yes Type of insurance plan: [employer] high deductible plan  Outpatient Pharmacy:  Prior to admission outpatient pharmacy: [facility] Is the patient willing to use [facility] TOC pharmacy at discharge? Yes Is the patient willing to transition their outpatient pharmacy to utilize a [facility] outpatient pharmacy?   Yes - [facility] mail order    Assessment: 1. Acute on chronic systolic CHF (LVEF 30-35%), due to ICM. NYHA class II symptoms. - Not volume overloaded on exam - No BB with bradycardia - Contniue losartan 12.5 mg daily - Consider adding spironolactone 12.5 mg daily for discharge - Continue Jardiacne 10  mg daily. Previously declined patient assistance paperwork but was able to talk again about this process and sign the paperwork. Application has been submitted with [facility].    Plan: 1) Medication changes recommended at this time: - Add spironolactone 12.5 mg daily  2) Patient assistance: - Has $7500 deductible left on insurance - Jardiance copay [phone] - copay card covers $175. Already have submitted patient assistance application for Jardiance.   3)  Education  - Patient has been educated on current HF medications and potential additions to HF medication regimen - Reviewed potential side effects with the current HF medications - Patient verbalizes understanding that over the next few months, these medication doses may change and more medications may be added to optimize HF regimen - Patient has been educated on basic disease state pathophysiology and goals of therapy   Sharen Hones, PharmD, BCPS Heart Failure Stewardship Pharmacist Phone [phone]

## 2023-02-21 NOTE — Telephone Encounter (Signed)
Patient Advocate Encounter  Forms for Patient Assistance were started while inpatient for Brilinta and Jardiance. Patient signed applications are attached to chart in the 'media' tab and would need provider portion to be completed and signed before faxing in.  Based on current criteria for Brilinta, patient would not be approved for [place], as patient does have commercial coverage. Based on current income listed, patient would need to show denial of Medicaid in order to be approved for Jardiance.  I have spoken with the patient, he is currently in the process of applying for medicaid. If approved, these medications should be affordable to the patient. If denied, patient will contact me with a copy of the denial letter so that the [place] application can be submitted. Will further assess options for Brilinta at that time, if needed.  Burnell Blanks, CPhT Rx Patient Advocate Phone: [phone]

## 2023-02-21 NOTE — Progress Notes (Signed)
Heart Failure Nurse Navigator Progress Note  PCP: Marcine Matar, MD PCP-Cardiologist: Jens Som Admission Diagnosis: None Admitted from: Home  Presentation:   Jimmy Parker presented with upper abdomen 8/10 pain, diarrhea, BP 118/60, EKG with bradycardia and T wave inversions, CXR unremarkable. Patient reported to currently living in a boarding house and hasn't taken any medications for over a year. Heart cath 11/8 showed 2 vessel obstructive CAD, S/p PCI of LCx with DES.   Patient educated on the sign and symptoms of heart failure, daily weights, diet/ fluid restrictions, taking all medications as prescribed and attending all medical appointments, Patient verbalized his understanding. A HF TOC was scheduled for 03/04/2023 @ 2 pm.   ECHO/ LVEF: 35-40%  Clinical Course:  Past Medical History:  Diagnosis Date   CAD (coronary artery disease)    a. 07/2013 Neg MV;  b. 06/2015 NSTEMI/Cath:LM nl, LAD 67m,  D1/2/3 nl, RI mod/nl, LCX 47m, OM1 small, OM2 nl, RCA 20p, 132m, RPDA fills via collats from OM3, EF 35-45%.   CAD- occluded RCA with collaterals 06/24/2015   Chest pain 08/03/2013   CHF (congestive heart failure) (HCC)    Diabetes mellitus (HCC) 05/28/2008   Qualifier: Diagnosis of  By: Levon Hedger     Diet-controlled diabetes mellitus (HCC)    Dyslipidemia 06/23/2015   DYSMETABOLIC SYNDROME 08/13/2008   Qualifier: Diagnosis of  By: Daphine Deutscher FNP, [place]     Heart attack [facility])    Hyperlipidemia    Hypertension    Hypertensive heart disease    Ischemic cardiomyopathy    a. 07/2013 Echo: EF 50-55%, no rwma, Gr1 DD;  b. 06/2015 EF 35-45% by Vgram.    NSTEMI (non-ST elevated myocardial infarction) (HCC) 06/23/2015   Tobacco abuse    a. quit 06/2015.   TOBACCO ABUSE 08/05/2009   Qualifier: Diagnosis of  By: Levon Hedger       Social History   Socioeconomic History   Marital status: Single    Spouse name: Not on file   Number of children: Not on file   Years of education: Not on  file   Highest education level: Not on file  Occupational History   Not on file  Tobacco Use   Smoking status: Former    Current packs/day: 0.50    Average packs/day: 0.5 packs/day for 7.7 years (3.8 ttl pk-yrs)    Types: Cigarettes    Start date: 06/27/2015   Smokeless tobacco: Never  Substance and Sexual Activity   Alcohol use: No    Alcohol/week: 0.0 standard drinks of alcohol   Drug use: No   Sexual activity: Not on file  Other Topics Concern   Not on file  Social History Narrative   Not on file   Social Determinants of Health   Financial Resource Strain: Not on file  Food Insecurity: No Food Insecurity (02/18/2023)   Hunger Vital Sign    Worried About Running Out of Food in the Last Year: Never true    Ran Out of Food in the Last Year: Never true  Transportation Needs: No Transportation Needs (02/18/2023)   PRAPARE - Administrator, Civil Service (Medical): No    Lack of Transportation (Non-Medical): No  Physical Activity: Not on file  Stress: Not on file  Social Connections: Not on file   Education Assessment and Provision:  Detailed education and instructions provided on heart failure disease management including the following:  Signs and symptoms of Heart Failure When to call the physician Importance  of daily weights Low sodium diet Fluid restriction Medication management Anticipated future follow-up appointments  Patient education given on each of the above topics.  Patient acknowledges understanding via teach back method and acceptance of all instructions.  Education Materials:  "Living Better With Heart Failure" Booklet, HF zone tool, & Daily Weight Tracker Tool.  Patient has scale at home: Yes Patient has pill box at home: NA    High Risk Criteria for Readmission and/or Poor Patient Outcomes: Heart failure hospital admissions (last 6 months): 0  No Show rate: 10 % Difficult social situation: Yes, lives in a Boarding house Demonstrates  medication adherence: No Primary Language: English Literacy level: Reading, writing, and comprehension  Barriers of Care:   Currently living in a boarding house Medication compliance Diet/ fluid restrictions (salt and soda) Daily weights  Considerations/Referrals:   Referral made to Heart Failure Pharmacist Stewardship: Yes Referral made to Heart Failure CSW/NCM TOC: Yes Referral made to Heart & Vascular TOC clinic: Yes, 03/04/2023 @ 2 pm  Items for Follow-up on DC/TOC: Medication compliance Diet/ fluid restrictions/daily weights Continued HF education    Rhae Hammock, BSN, RN Heart Failure Teacher, adult education Only

## 2023-02-22 ENCOUNTER — Telehealth: Payer: Self-pay

## 2023-02-22 NOTE — Transitions of Care (Post Inpatient/ED Visit) (Signed)
   02/22/2023  Name: Jimmy Parker MRN: [id number] DOB: 07-18-1970  Today's TOC FU Call Status:    Attempted to reach the patient regarding the most recent Inpatient/ED visit.  Follow Up Plan: Additional outreach attempts will be made to reach the patient to complete the Transitions of Care (Post Inpatient/ED visit) call.   Alyse Low, RN, BA, [facility], CRRN [facility] Coordinator, Transition of Care Ph # [phone]

## 2023-02-23 MED FILL — Nitroglycerin IV Soln 100 MCG/ML in D5W: INTRA_ARTERIAL | Qty: 10 | Status: AC

## 2023-02-23 MED FILL — Verapamil HCl IV Soln 2.5 MG/ML: INTRAVENOUS | Qty: 2 | Status: AC

## 2023-02-24 ENCOUNTER — Telehealth: Payer: Self-pay

## 2023-02-24 ENCOUNTER — Telehealth (HOSPITAL_COMMUNITY): Payer: Self-pay

## 2023-02-24 NOTE — Transitions of Care (Post Inpatient/ED Visit) (Signed)
   02/24/2023  Name: PUJAN FREELY MRN: [id number] DOB: May 14, 1970  Today's TOC FU Call Status: Today's TOC FU Call Status:: Unsuccessful Call (2nd Attempt) Unsuccessful Call (2nd Attempt) Date: 02/24/23  Attempted to reach the patient regarding the most recent Inpatient/ED visit.  Follow Up Plan: Additional outreach attempts will be made to reach the patient to complete the Transitions of Care (Post Inpatient/ED visit) call.   Alyse Low, RN, BA, [facility], CRRN [facility] Coordinator, Transition of Care Ph # [phone]

## 2023-02-24 NOTE — Progress Notes (Signed)
Cardiology Clinic Note   Patient Name: Jimmy Parker Date of Encounter: 02/28/2023  Primary Care Provider:  Patient, No Pcp Per Primary Cardiologist:  Olga Millers, MD  Patient Profile    Jimmy Parker 52 year old male presents the clinic today for follow-up evaluation of his coronary artery disease and essential hypertension.  Past Medical History    Past Medical History:  Diagnosis Date   CAD (coronary artery disease)    a. 07/2013 Neg MV;  b. 06/2015 NSTEMI/Cath:LM nl, LAD 45m,  D1/2/3 nl, RI mod/nl, LCX 29m, OM1 small, OM2 nl, RCA 20p, 164m, RPDA fills via collats from OM3, EF 35-45%.   CAD- occluded RCA with collaterals 06/24/2015   Chest pain 08/03/2013   CHF (congestive heart failure) (HCC)    Diabetes mellitus (HCC) 05/28/2008   Qualifier: Diagnosis of  By: Levon Hedger     Diet-controlled diabetes mellitus (HCC)    Dyslipidemia 06/23/2015   DYSMETABOLIC SYNDROME 08/13/2008   Qualifier: Diagnosis of  By: Daphine Deutscher FNP, [place]     Heart attack [facility])    Hyperlipidemia    Hypertension    Hypertensive heart disease    Ischemic cardiomyopathy    a. 07/2013 Echo: EF 50-55%, no rwma, Gr1 DD;  b. 06/2015 EF 35-45% by Vgram.    NSTEMI (non-ST elevated myocardial infarction) (HCC) 06/23/2015   Tobacco abuse    a. quit 06/2015.   TOBACCO ABUSE 08/05/2009   Qualifier: Diagnosis of  By: Levon Hedger     Past Surgical History:  Procedure Laterality Date   CARDIAC CATHETERIZATION N/A 06/23/2015   Procedure: Left Heart Cath and Coronary Angiography;  Surgeon: Kathleene Hazel, MD;  Location: [facility] INVASIVE CV LAB;  Service: Cardiovascular;  Laterality: N/A;   CORONARY STENT INTERVENTION N/A 02/18/2023   Procedure: CORONARY STENT INTERVENTION;  Surgeon: Swaziland, Peter M, MD;  Location: [facility] INVASIVE CV LAB;  Service: Cardiovascular;  Laterality: N/A;   FRACTURE SURGERY     femur and hip   RIGHT/LEFT HEART CATH AND CORONARY ANGIOGRAPHY N/A 02/18/2023   Procedure: RIGHT/LEFT HEART  CATH AND CORONARY ANGIOGRAPHY;  Surgeon: Swaziland, Peter M, MD;  Location: [facility] INVASIVE CV LAB;  Service: Cardiovascular;  Laterality: N/A;   SHOULDER SURGERY     Right: Patient denies any hx of shoulder surgery    Allergies  No Known Allergies  History of Present Illness    Jimmy Parker has a PMH of diabetes, NSTEMI, ischemic cardiomyopathy, tobacco abuse, acute on chronic systolic CHF, elevated liver enzymes, and upper abdominal pain.  He had NSTEMI 2017.  He was admitted to the hospital on 02/18/2023 and discharged on 02/21/2023.  Cardiology was consulted on 02/18/2023 for evaluation of epigastric pain.  He reported that he was feeling well until 02/14/2023.  He works in Production designer, theatre/television/film.  The Monday prior to presenting to the hospital he noted stomach discomfort and associated fatigue.  He felt his symptoms moved to his lower chest at times.  He developed shortness of breath and orthopnea.  He missed work for 48 hours.  He attempted to go back to work but noted persistent symptoms.  He had persistent nausea but no vomiting.  He was spitting up saliva.  He felt chills but no fevers.  He did note loose stools.  He denied history of cholecystectomy or appendectomy.  He reported that his previous MI symptoms in 2017 were similar which prompted him to go to the emergency department.  His temperature was noted to be 99.9.  His blood pressure was 110-140 over 70s and 80s.  His BMP was unremarkable.  His LFTs showed an AST of 93 and ALT of 179.  His CBC was unremarkable.  His EKG showed junctional bradycardia with T wave inversion in V4 through V6 with T wave flattening in 1 and aVL.  His high-sensitivity troponins were noted to be 2740.  He underwent cardiac catheterization on 02/18/2023 which showed a complete occlusion of mid RCA with left-to-right collaterals, mild nonobstructive disease in his LAD and circumflex.  He received PCI with DES to his mid circumflex.  He was placed on dual antiplatelet therapy  (aspirin, Brilinta).  His echocardiogram at that time showed an EF of 35-40%, moderate reduced RV function, moderately dilated right atrium, and moderate-severe TR.  His GDMT was unable to be titrated due to bradycardia.  He was continued on Jardiance, losartan 12.5 mg daily, with plans to transition to [facility] as an outpatient if able.  He presents to the clinic today for follow-up evaluation and states he felt he had better blood pressure before starting medications.  We reviewed his cardiac catheterization and heart failure.  We also reviewed his cardiac catheterization from March 2017.  He reported that he felt Lied to".  About his previous cardiac catheterization.  I reviewed GDMT titration.  He does report an episode of chest discomfort earlier this morning with walking.  It dissipated without intervention as he continues to walk.   I filled out his FMLA paperwork.  We reviewed his upcoming visit with heart failure.  I increased his losartan to 25 mg daily, will repeat BMP in 1 week and repeat fasting lipids and LFTs in 6 to 8 weeks.  Will plan follow-up in 1-2 months  Today he denies  shortness of breath, lower extremity edema, fatigue, palpitations, melena, hematuria, hemoptysis, diaphoresis, weakness, presyncope, syncope, orthopnea, and PND.   Home Medications    Prior to Admission medications   Medication Sig Start Date End Date Taking? Authorizing Provider  aspirin 81 MG chewable tablet Chew 1 tablet (81 mg total) by mouth daily. 02/21/23   Arty Baumgartner, NP  atorvastatin (LIPITOR) 80 MG tablet Take 1 tablet (80 mg total) by mouth daily. 02/21/23   Arty Baumgartner, NP  empagliflozin (JARDIANCE) 10 MG TABS tablet Take 1 tablet (10 mg total) by mouth daily. 02/20/23   Croitoru, Mihai, MD  losartan (COZAAR) 25 MG tablet Take 0.5 tablets (12.5 mg total) by mouth daily. 02/21/23   Arty Baumgartner, NP  nicotine polacrilex (NICORETTE) 2 MG gum Take 1 each (2 mg total) by mouth as  needed for smoking cessation. 04/01/22   Marcine Matar, MD  nitroGLYCERIN (NITROSTAT) 0.4 MG SL tablet Place 1 tablet (0.4 mg total) under the tongue every 5 (five) minutes as needed. 02/21/23   Arty Baumgartner, NP  prasugrel (EFFIENT) 10 MG TABS tablet Take 1 tablet (10 mg total) by mouth daily. 02/21/23   Arty Baumgartner, NP    Family History    Family History  Problem Relation Age of Onset   Heart attack Maternal Grandfather    Hypertension Maternal Grandfather    Heart attack Paternal Grandmother    He indicated that his mother is alive. He indicated that his father is deceased. He indicated that the status of his maternal grandfather is unknown. He indicated that the status of his paternal grandmother is unknown.  Social History    Social History   Socioeconomic History   Marital status:  Single    Spouse name: Not on file   Number of children: 1   Years of education: Not on file   Highest education level: High school graduate  Occupational History   Occupation: [employer]  Tobacco Use   Smoking status: Former    Current packs/day: 0.50    Average packs/day: 0.5 packs/day for 7.7 years (3.8 ttl pk-yrs)    Types: Cigarettes    Start date: 06/27/2015   Smokeless tobacco: Never  Vaping Use   Vaping status: Never Used  Substance and Sexual Activity   Alcohol use: No    Alcohol/week: 0.0 standard drinks of alcohol   Drug use: No   Sexual activity: Not on file  Other Topics Concern   Not on file  Social History Narrative   Not on file   Social Determinants of Health   Financial Resource Strain: Low Risk  (02/21/2023)   Overall Financial Resource Strain (CARDIA)    Difficulty of Paying Living Expenses: Not very hard  Food Insecurity: No Food Insecurity (02/21/2023)   Hunger Vital Sign    Worried About Running Out of Food in the Last Year: Never true    Ran Out of Food in the Last Year: Never true  Transportation Needs: No Transportation Needs (02/21/2023)    PRAPARE - Administrator, Civil Service (Medical): No    Lack of Transportation (Non-Medical): No  Physical Activity: Not on file  Stress: Not on file  Social Connections: Not on file  Intimate Partner Violence: Not At Risk (02/18/2023)   Humiliation, Afraid, Rape, and Kick questionnaire    Fear of Current or Ex-Partner: No    Emotionally Abused: No    Physically Abused: No    Sexually Abused: No     Review of Systems    General:  No chills, fever, night sweats or weight changes.  Cardiovascular:  No chest pain, dyspnea on exertion, edema, orthopnea, palpitations, paroxysmal nocturnal dyspnea. Dermatological: No rash, lesions/masses Respiratory: No cough, dyspnea Urologic: No hematuria, dysuria Abdominal:   No nausea, vomiting, diarrhea, bright red blood per rectum, melena, or hematemesis Neurologic:  No visual changes, wkns, changes in mental status. All other systems reviewed and are otherwise negative except as noted above.  Physical Exam    VS:  BP (!) 144/78   Pulse 69   Ht 5\' 7"  (1.702 m)   Wt 144 lb 3.2 oz (65.4 kg)   SpO2 99%   BMI 22.58 kg/m  , BMI Body mass index is 22.58 kg/m. GEN: Well nourished, well developed, in no acute distress. HEENT: normal. Neck: Supple, no JVD, carotid bruits, or masses. Cardiac: RRR, no murmurs, rubs, or gallops. No clubbing, cyanosis, edema.  Radials/DP/PT 2+ and equal bilaterally.  Respiratory:  Respirations regular and unlabored, clear to auscultation bilaterally. GI: Soft, nontender, nondistended, BS + x 4. MS: no deformity or atrophy. Skin: warm and dry, no rash.  Right radial cath site healed well no signs of infection. Neuro:  Strength and sensation are intact. Psych: Normal affect.  Accessory Clinical Findings    Recent Labs: 02/17/2023: ALT 179 02/18/2023: B Natriuretic Peptide 263.4; Magnesium 2.2; TSH 3.228 02/19/2023: BUN 14; Creatinine, Ser 1.14; Potassium 3.6; Sodium 135 02/20/2023: Hemoglobin 13.9;  Platelets 164   Recent Lipid Panel    Component Value Date/Time   CHOL 118 02/18/2023 0550   CHOL 180 04/01/2022 1133   TRIG 33 02/18/2023 0550   HDL 36 (L) 02/18/2023 0550   HDL 43 04/01/2022 1133  CHOLHDL 3.3 02/18/2023 0550   VLDL 7 02/18/2023 0550   LDLCALC 75 02/18/2023 0550   LDLCALC 114 (H) 04/01/2022 1133    HYPERTENSION CONTROL Vitals:   02/28/23 0814 02/28/23 0854  BP: (!) 150/80 (!) 144/78    The patient's blood pressure is elevated above target today.  In order to address the patient's elevated BP: Blood pressure will be monitored at home to determine if medication changes need to be made.; A current anti-hypertensive medication was adjusted today.       ECG personally reviewed by me today- EKG Interpretation Date/Time:  Monday February 28 2023 08:32:41 EST Ventricular Rate:  69 PR Interval:  124 QRS Duration:  100 QT Interval:  420 QTC Calculation: 450 R Axis:   1  Text Interpretation: Normal sinus rhythm Minimal voltage criteria for LVH, may be normal variant ( Cornell product ) T wave abnormality, consider anterolateral ischemia When compared with ECG of 19-Feb-2023 09:57, Sinus rhythm has replaced Junctional rhythm Confirmed by Edd Fabian [phone]) on 02/28/2023 8:34:36 AM   Echocardiogram 02/18/2023 IMPRESSIONS     1. Left ventricular ejection fraction, by estimation, is 35 to 40%. The  left ventricle has moderately decreased function. The left ventricle  demonstrates global hypokinesis. There is mild left ventricular  hypertrophy. Left ventricular diastolic  parameters are indeterminate.   2. Right ventricular systolic function is moderately reduced. The right  ventricular size is normal. There is mildly elevated pulmonary artery  systolic pressure.   3. Right atrial size was moderately dilated.   4. Trivial mitral valve regurgitation.   5. Tricuspid valve regurgitation is moderate to severe.   6. The aortic valve is tricuspid. Aortic valve  regurgitation is trivial.   FINDINGS   Left Ventricle: Left ventricular ejection fraction, by estimation, is 35  to 40%. The left ventricle has moderately decreased function. The left  ventricle demonstrates global hypokinesis. The left ventricular internal  cavity size was normal in size.  There is mild left ventricular hypertrophy. Left ventricular diastolic  parameters are indeterminate.   Right Ventricle: The right ventricular size is normal. Right vetricular  wall thickness was not assessed. Right ventricular systolic function is  moderately reduced. There is mildly elevated pulmonary artery systolic  pressure. The tricuspid regurgitant  velocity is 2.66 m/s, and with an assumed right atrial pressure of 15  mmHg, the estimated right ventricular systolic pressure is 43.3 mmHg.   Left Atrium: Left atrial size was normal in size.   Right Atrium: Right atrial size was moderately dilated.   Pericardium: There is no evidence of pericardial effusion.   Mitral Valve: There is mild thickening of the mitral valve leaflet(s).  Trivial mitral valve regurgitation.   Tricuspid Valve: The tricuspid valve is normal in structure. Tricuspid  valve regurgitation is moderate to severe.   Aortic Valve: The aortic valve is tricuspid. Aortic valve regurgitation is  trivial. Aortic valve mean gradient measures 3.0 mmHg. Aortic valve peak  gradient measures 5.7 mmHg. Aortic valve area, by VTI measures 1.71 cm.   Pulmonic Valve: The pulmonic valve was grossly normal. Pulmonic valve  regurgitation is not visualized.   Aorta: The aortic root is normal in size and structure.   IAS/Shunts: No atrial level shunt detected by color flow Doppler.   Cardiac catheterization 02/18/2023  2 vessel obstructive CAD. 90% mid LCx. 100% proximal to mid RCA CTO with left to right collaterals. Moderate LV dysfunction. EF estimated at 40%. Global dysfunction Normal LV filling pressures. LVEDP 9 mm  Hg. PCWP 19/18  with mean 15 mm Hg Normal PAP 44/12, mean 21 mm Hg Reduced cardiac output. 3.78 L/min index 2.17 Successful PCI of the mid LCx with DES   Plan: DAPT for one year. Anticipate DC tomorrow. Optimize medical therapy for CHF. RCA occlusion is not suited for CTO PCI- treat medically   Diagnostic Dominance: Right  Intervention        Assessment & Plan   1.  NSTEMI-episode of atypical chest discomfort today with walking.  Dissipated without intervention..  Cardiac catheterization 02/18/2023 with CTO of RCA and PCI with DES x 1 to his circumflex.  He was noted to have 35% proximal LAD stenosis and left to right collaterals.  Details above. Continue aspirin, Effient, atorvastatin Heart healthy low-sodium diet Increase physical activity as tolerated  Acute on chronic systolic CHF-euvolemic.  Weight stable.  BP today 144/78.  EF noted to be 30-35% on 02/18/2023.  Unable to uptitrate GDMT due to low blood pressure. Continue losartan, Jardiance Increase losartan to 25 mg daily Heart healthy low-sodium diet Increase physical activity as tolerated Repeat BMP in 1-2 weeks Plan for repeat echocardiogram once GDMT has been optimized x 1 month and 3 months post cath.  Hyperlipidemia-LDL 75 on 02/18/23. High-fiber diet Atorvastatin, aspirin Increase physical activity as tolerated Repeat fasting lipids and LFTs in 6-8 weeks  Essential hypertension-BP today 144/78. Maintain blood pressure log Continue losartan  Type 2 diabetes-glucose 151 on 02/19/2023. Carb modified diet Continue Jardiance Follows with PCP   Disposition: Follow-up with Dr. Jens Som or me in 1-2 months.   Thomasene Ripple. Zynasia Burklow NP-C     02/28/2023, 8:58 AM [facility] [address] Office [phone] Fax [phone]    I spent 14 minutes examining this patient, reviewing medications, and using patient centered shared decision making involving her cardiac care.   I spent greater  than 20 minutes reviewing her past medical history,  medications, and prior cardiac tests.

## 2023-02-24 NOTE — Telephone Encounter (Signed)
Pt is not interested in the cardiac rehab program. Closed referral 

## 2023-02-25 ENCOUNTER — Telehealth: Payer: Self-pay

## 2023-02-25 ENCOUNTER — Telehealth: Payer: Self-pay | Admitting: General Practice

## 2023-02-25 NOTE — Transitions of Care (Post Inpatient/ED Visit) (Signed)
   02/25/2023  Name: MELDON JAHODA MRN: [id number] DOB: 03-06-1971  Today's TOC FU Call Status: Today's TOC FU Call Status:: Unsuccessful Call (3rd Attempt) Unsuccessful Call (3rd Attempt) Date: 02/25/23  Attempted to reach the patient regarding the most recent Inpatient/ED visit.  Follow Up Plan: No further outreach attempts will be made at this time. We have been unable to contact the patient.  Alyse Low, RN, BA, [facility], CRRN [facility] Coordinator, Transition of Care Ph # [phone]

## 2023-02-25 NOTE — Telephone Encounter (Signed)
FMLA paperwork given to Corinda for J. Cleaver.  JB, 02-25-23

## 2023-02-25 NOTE — Telephone Encounter (Signed)
Left vm regarding FMLA paperwork. Pt has upcoming appt on 11-18 w/J.Cleaver. Pt will need to complete 2 FMLA forms + $29 processing fee (cash, check, or money order).  JB, 02-25-23

## 2023-02-28 ENCOUNTER — Ambulatory Visit: Payer: No Typology Code available for payment source | Attending: General Practice | Admitting: General Practice

## 2023-02-28 ENCOUNTER — Encounter: Payer: Self-pay | Admitting: General Practice

## 2023-02-28 VITALS — BP 144/78 | HR 69 | Ht 67.0 in | Wt 144.2 lb

## 2023-02-28 DIAGNOSIS — I214 Non-ST elevation (NSTEMI) myocardial infarction: Secondary | ICD-10-CM

## 2023-02-28 DIAGNOSIS — I10 Essential (primary) hypertension: Secondary | ICD-10-CM | POA: Diagnosis not present

## 2023-02-28 DIAGNOSIS — I5023 Acute on chronic systolic (congestive) heart failure: Secondary | ICD-10-CM | POA: Diagnosis not present

## 2023-02-28 DIAGNOSIS — E782 Mixed hyperlipidemia: Secondary | ICD-10-CM

## 2023-02-28 DIAGNOSIS — F17200 Nicotine dependence, unspecified, uncomplicated: Secondary | ICD-10-CM

## 2023-02-28 MED ORDER — LOSARTAN POTASSIUM 25 MG PO TABS: 25.0000 mg | ORAL_TABLET | Freq: Every day | ORAL | 0 refills | Status: DC

## 2023-02-28 NOTE — Patient Instructions (Signed)
Medication Instructions:  Increase Losartan to 25 mg daily  *If you need a refill on your cardiac medications before your next appointment, please call your pharmacy*   Lab Work: BMP in 1-2 weeks  Lipid and liver in 8 weeks  If you have labs (blood work) drawn today and your tests are completely normal, you will receive your results only by: MyChart Message (if you have MyChart) OR A paper copy in the mail If you have any lab test that is abnormal or we need to change your treatment, we will call you to review the results.   Testing/Procedures: None ordered    Follow-Up: At [facility], you and your health needs are our priority.  As part of our continuing mission to provide you with exceptional heart care, we have created designated Provider Care Teams.  These Care Teams include your primary Cardiologist (physician) and Advanced Practice Providers (APPs -  Physician Assistants and Nurse Practitioners) who all work together to provide you with the care you need, when you need it.    Your next appointment:   1-2  month(s)  Provider:   Edd Fabian, FNP        Other Instructions  Increase physical activity as tolerated

## 2023-03-03 NOTE — Progress Notes (Signed)
HEART & VASCULAR TRANSITION OF CARE CONSULT NOTE     Referring Physician: Dr. Herbie [place] Primary Care: Patient, No Pcp Per Primary Cardiologist: Olga Millers, MD  HPI: Referred to clinic by Dr. Herbie Baltimore for heart failure consultation.   52 yo BM with known CTO of the mid RCA, recently admitted 11/24 w/ NSTEMI and new CHF. EF 35-40% by Echo. LHC demonstrated 2 vessel obstructive CAD. 90% mid LCx. 100% proximal to mid RCA CTO with left to right collaterals. Underwent PCI of the LCx w/ DES. LV gram showed moderate LV dysfunction. EF estimated at 40%. Global dysfunction. RHC demonstrated normal LV filling pressures, LVEDP 9 mm Hg, PAP 44/12 (21), mPCWP 15 and reduced cardiac output. 3.78 L/min index 2.17 L/min2. He was placed on GDMT for CAD and CHF and referred to [facility] clinic at d/c.   He presents today for assessment. Denies CP but has had exertional dyspnea, NYHA Class II-early III. Also w/ exertional fatigue. Reports full med compliance but BP has been elevated. Elevated at cardiology f/u yesterday and elevated today in the upper 160s-180s systolic. Denies LEE. ReDs elevated at 41%.   He is worried about being out of work. 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      Cardiac Testing   2D Echo 11/24 1. Left ventricular ejection fraction, by estimation, is 35 to 40%. The  left ventricle has moderately decreased function. The left ventricle  demonstrates global hypokinesis. There is mild left ventricular  hypertrophy. Left ventricular diastolic  parameters are indeterminate.   2. Right ventricular systolic function is moderately reduced. The right  ventricular size is normal. There is mildly elevated pulmonary artery  systolic pressure.   3. Right atrial size was moderately dilated.   4. Trivial mitral valve regurgitation.   5. Tricuspid valve regurgitation is moderate to severe.   6. The aortic valve is tricuspid. Aortic valve  regurgitation is trivial.    R/LHC w/ PCI 11/24 2 vessel obstructive CAD. 90% mid LCx. 100% proximal to mid RCA CTO with left to right collaterals. Moderate LV dysfunction. EF estimated at 40%. Global dysfunction Normal LV filling pressures. LVEDP 9 mm Hg. PCWP 19/18 with mean 15 mm Hg Normal PAP 44/12, mean 21 mm Hg Reduced cardiac output. 3.78 L/min index 2.17 Successful PCI of the mid LCx with DES   Plan: DAPT for one year. Optimize medical therapy for CHF. RCA occlusion is not suited for CTO PCI- treat medically    Review of Systems: [y] = yes, [ ]  = no   General: Weight gain [ ] ; Weight loss [ ] ; Anorexia [ ] ; Fatigue [ ] ; Fever [ ] ; Chills [ ] ; Weakness [ ]   Cardiac: Chest pain/pressure [ ] ; Resting SOB [ ] ; Exertional SOB [ ] ; Orthopnea [ ] ; Pedal Edema [ ] ; Palpitations [ ] ; Syncope [ ] ; Presyncope [ ] ; Paroxysmal nocturnal dyspnea[ ]   Pulmonary: Cough [ ] ; Wheezing[ ] ; Hemoptysis[ ] ; Sputum [ ] ; Snoring [ ]   GI: Vomiting[ ] ; Dysphagia[ ] ; Melena[ ] ; Hematochezia [ ] ; Heartburn[ ] ; Abdominal pain [ ] ; Constipation [ ] ; Diarrhea [ ] ; BRBPR [ ]   GU: Hematuria[ ] ; Dysuria [ ] ; Nocturia[ ]   Vascular: Pain in legs with walking [ ] ; Pain in feet with lying flat [ ] ; Non-healing sores [ ] ; Stroke [ ] ; TIA [ ] ; Slurred speech [ ] ;  Neuro: Headaches[ ] ; Vertigo[ ] ; Seizures[ ] ; Paresthesias[ ] ;Blurred vision [ ] ; Diplopia [ ] ;  Vision changes [ ]   Ortho/Skin: Arthritis [ ] ; Joint pain [ ] ; Muscle pain [ ] ; Joint swelling [ ] ; Back Pain [ ] ; Rash [ ]   Psych: Depression[ ] ; Anxiety[ ]   Heme: Bleeding problems [ ] ; Clotting disorders [ ] ; Anemia [ ]   Endocrine: Diabetes [ ] ; Thyroid dysfunction[ ]    Past Medical History:  Diagnosis Date   CAD (coronary artery disease)    a. 07/2013 Neg MV;  b. 06/2015 NSTEMI/Cath:LM nl, LAD 18m,  D1/2/3 nl, RI mod/nl, LCX 37m, OM1 small, OM2 nl, RCA 20p, 170m, RPDA fills via collats from OM3, EF 35-45%.   CAD- occluded RCA with collaterals 06/24/2015    Chest pain 08/03/2013   CHF (congestive heart failure) (HCC)    Diabetes mellitus (HCC) 05/28/2008   Qualifier: Diagnosis of  By: Levon Hedger     Diet-controlled diabetes mellitus (HCC)    Dyslipidemia 06/23/2015   DYSMETABOLIC SYNDROME 08/13/2008   Qualifier: Diagnosis of  By: Daphine Deutscher FNP, Nykedtra     Heart attack [facility])    Hyperlipidemia    Hypertension    Hypertensive heart disease    Ischemic cardiomyopathy    a. 07/2013 Echo: EF 50-55%, no rwma, Gr1 DD;  b. 06/2015 EF 35-45% by Vgram.    NSTEMI (non-ST elevated myocardial infarction) (HCC) 06/23/2015   Tobacco abuse    a. quit 06/2015.   TOBACCO ABUSE 08/05/2009   Qualifier: Diagnosis of  By: Levon Hedger      Current Outpatient Medications  Medication Sig Dispense Refill   aspirin 81 MG chewable tablet Chew 1 tablet (81 mg total) by mouth daily. 90 tablet 2   atorvastatin (LIPITOR) 80 MG tablet Take 1 tablet (80 mg total) by mouth daily. 90 tablet 1   empagliflozin (JARDIANCE) 10 MG TABS tablet Take 1 tablet (10 mg total) by mouth daily. 30 tablet 0   losartan (COZAAR) 25 MG tablet Take 1 tablet (25 mg total) by mouth daily. 90 tablet 0   nitroGLYCERIN (NITROSTAT) 0.4 MG SL tablet Place 1 tablet (0.4 mg total) under the tongue every 5 (five) minutes as needed. 25 tablet 2   prasugrel (EFFIENT) 10 MG TABS tablet Take 1 tablet (10 mg total) by mouth daily. 30 tablet 11   nicotine polacrilex (NICORETTE) 2 MG gum Take 1 each (2 mg total) by mouth as needed for smoking cessation. (Patient not taking: Reported on 03/04/2023) 100 tablet 3   No current facility-administered medications for this encounter.    No Known Allergies    Social History   Socioeconomic History   Marital status: Single    Spouse name: Not on file   Number of children: 1   Years of education: Not on file   Highest education level: High school graduate  Occupational History   Occupation: [employer]  Tobacco Use   Smoking status: Former    Current  packs/day: 0.50    Average packs/day: 0.5 packs/day for 7.7 years (3.8 ttl pk-yrs)    Types: Cigarettes    Start date: 06/27/2015   Smokeless tobacco: Never  Vaping Use   Vaping status: Never Used  Substance and Sexual Activity   Alcohol use: No    Alcohol/week: 0.0 standard drinks of alcohol   Drug use: No   Sexual activity: Not on file  Other Topics Concern   Not on file  Social History Narrative   Not on file   Social Determinants of Health   Financial Resource Strain: Low Risk  (02/21/2023)  Overall Financial Resource Strain (CARDIA)    Difficulty of Paying Living Expenses: Not very hard  Food Insecurity: No Food Insecurity (02/21/2023)   Hunger Vital Sign    Worried About Running Out of Food in the Last Year: Never true    Ran Out of Food in the Last Year: Never true  Transportation Needs: No Transportation Needs (02/21/2023)   PRAPARE - Administrator, Civil Service (Medical): No    Lack of Transportation (Non-Medical): No  Physical Activity: Not on file  Stress: Not on file  Social Connections: Not on file  Intimate Partner Violence: Not At Risk (02/18/2023)   Humiliation, Afraid, Rape, and Kick questionnaire    Fear of Current or Ex-Partner: No    Emotionally Abused: No    Physically Abused: No    Sexually Abused: No      Family History  Problem Relation Age of Onset   Heart attack Maternal Grandfather    Hypertension Maternal Grandfather    Heart attack Paternal Grandmother     Vitals:   03/04/23 1351  BP: (!) 176/88  Pulse: 99  SpO2: 98%  Weight: 64.4 kg (142 lb)    PHYSICAL EXAM: ReDs 44%  General:  fatigued appearing. No respiratory difficulty HEENT: normal Neck: supple.  JVD 9 cm Carotids 2+ bilat; no bruits. No lymphadenopathy or thryomegaly appreciated. Cor: PMI nondisplaced. Regular rate & rhythm. No rubs, gallops or murmurs. Lungs: clear Abdomen: soft, nontender, nondistended. No hepatosplenomegaly. No bruits or masses. Good  bowel sounds. Extremities: no cyanosis, clubbing, rash, edema Neuro: alert & oriented x 3, cranial nerves grossly intact. moves all 4 extremities w/o difficulty. Affect pleasant.  ECG: NSR 86 bpm    ASSESSMENT & PLAN:  1. Chronic Systolic Heart Failure - Echo 78/46 EF 35 to 40%, RV moderately reduced - ischemic CM, s/p recent LCx PCI w/ residual CTO of RCA w/ L>>R collaterals  - NYHA Class II-early III. Volume overload on exam and by ReDs 44%  - Continue Jardiance 10 mg daily  - Stop Losartan - Start Entresto 49-51 mg bid - Start Spiro 12.5 mg daily  - Rx given for Lasix 20 mg PRN - if still symptomatic despite diuresis, will start digoxin next visit given marginal output on RHC  - no ? blocker yet - BMP/BNP today  - hopefully LVEF will improve post revascularization but worry about trajectory of HF if it does not. Will refer to the [facility].  - needs to quit smoking to be considered for advanced therapies    2. CAD  - NSTEMI 11/24 - LHC demonstrated 2 vessel obstructive CAD. 90% mid LCx. 100% proximal to mid RCA CTO with left to right collaterals. Underwent PCI of the LCx w/ DES. RCA medically managed  - denies CP  - DAPT w/ ASA + Effient  - atorvastatin 80 mg  - no ? blocker w/ marginal cardiac output on RHC   3. HTN - BP elevated - GDMT titration per above   It is in my medical opinion that given his line of work, he stay out of work until his f/u w/ Dr. Gasper Lloyd in 2 wks   Referred to HFSW (PCP, Medications, Transportation, ETOH Abuse, Drug Abuse, Insurance, Surveyor, quantity ): Yes Assistance w/ FLMA and short term disability  Refer to Pharmacy: Yes (assistance w/ Sherryll Burger)  Refer to Home Health:  No Refer to Advanced Heart Failure Clinic: Yes (assing to Dr. Gasper Lloyd) Refer to General Cardiology: Yes shared care (they will follow  CAD)   Follow up  in 1 wk for f/u BMP and w/ HF MD in 2 wks.  Robbie Lis, PA-C  03/04/2023

## 2023-03-04 ENCOUNTER — Ambulatory Visit (HOSPITAL_COMMUNITY)
Admit: 2023-03-04 | Discharge: 2023-03-04 | Disposition: A | Discharge status: Home / Self Care | Payer: No Typology Code available for payment source | Source: Ambulatory Visit | Attending: Cardiology

## 2023-03-04 ENCOUNTER — Other Ambulatory Visit (HOSPITAL_COMMUNITY): Payer: Self-pay

## 2023-03-04 VITALS — BP 176/88 | HR 99 | Wt 142.0 lb

## 2023-03-04 DIAGNOSIS — I5022 Chronic systolic (congestive) heart failure: Secondary | ICD-10-CM | POA: Insufficient documentation

## 2023-03-04 DIAGNOSIS — I2510 Atherosclerotic heart disease of native coronary artery without angina pectoris: Secondary | ICD-10-CM | POA: Diagnosis not present

## 2023-03-04 DIAGNOSIS — Z955 Presence of coronary angioplasty implant and graft: Secondary | ICD-10-CM | POA: Insufficient documentation

## 2023-03-04 DIAGNOSIS — Z7984 Long term (current) use of oral hypoglycemic drugs: Secondary | ICD-10-CM | POA: Insufficient documentation

## 2023-03-04 DIAGNOSIS — Z79899 Other long term (current) drug therapy: Secondary | ICD-10-CM | POA: Insufficient documentation

## 2023-03-04 DIAGNOSIS — Z7902 Long term (current) use of antithrombotics/antiplatelets: Secondary | ICD-10-CM | POA: Insufficient documentation

## 2023-03-04 DIAGNOSIS — I5023 Acute on chronic systolic (congestive) heart failure: Secondary | ICD-10-CM | POA: Diagnosis not present

## 2023-03-04 DIAGNOSIS — I214 Non-ST elevation (NSTEMI) myocardial infarction: Secondary | ICD-10-CM | POA: Insufficient documentation

## 2023-03-04 DIAGNOSIS — I110 Hypertensive heart disease with heart failure: Secondary | ICD-10-CM | POA: Diagnosis not present

## 2023-03-04 DIAGNOSIS — I255 Ischemic cardiomyopathy: Secondary | ICD-10-CM | POA: Diagnosis not present

## 2023-03-04 LAB — BASIC METABOLIC PANEL
Anion gap: 13 (ref 5–15)
BUN: 7 mg/dL (ref 6–20)
CO2: 25 mmol/L (ref 22–32)
Calcium: 9.8 mg/dL (ref 8.9–10.3)
Chloride: 100 mmol/L (ref 98–111)
Creatinine, Ser: 0.92 mg/dL (ref 0.61–1.24)
GFR, Estimated: >60 mL/min (ref >60)
Glucose, Bld: 80 mg/dL (ref 70–99)
Potassium: 3.4 mmol/L — ABNORMAL LOW (ref 3.5–5.1)
Sodium: 138 mmol/L (ref 135–145)

## 2023-03-04 LAB — BRAIN NATRIURETIC PEPTIDE: B Natriuretic Peptide: 246.5 pg/mL — ABNORMAL HIGH (ref 0.0–100.0)

## 2023-03-04 MED ORDER — SPIRONOLACTONE 25 MG PO TABS: 12.5000 mg | ORAL_TABLET | Freq: Every day | ORAL | 3 refills | Status: DC

## 2023-03-04 MED ORDER — ENTRESTO 49-51 MG PO TABS: 1.0000 | ORAL_TABLET | Freq: Two times a day (BID) | ORAL | 3 refills | Status: DC

## 2023-03-04 MED ORDER — FUROSEMIDE 20 MG PO TABS
20.0000 mg | ORAL_TABLET | Freq: Every day | ORAL | 3 refills | Status: AC | PRN
Start: 2023-03-04 — End: ?

## 2023-03-04 NOTE — Progress Notes (Signed)
Medication Samples have been provided to the patient.  Drug name: entresto       Strength: 49/51        Qty: 2 bottles  LOT: [id number]  Exp.Date: 03/11/25  Dosing instructions: TAKE ONE TABLET TWICE DAILY   The patient has been instructed regarding the correct time, dose, and frequency of taking this medication, including desired effects and most common side effects.   Jimmy Parker B [name] 2:40 PM 03/04/2023;e

## 2023-03-04 NOTE — Progress Notes (Signed)
ReDS Vest / Clip - 03/04/23 1351       ReDS Vest / Clip   Station Marker C    Ruler Value 22    ReDS Value Range High volume overload    ReDS Actual Value 41

## 2023-03-04 NOTE — Progress Notes (Signed)
Entresto samples not given, as pt was given 30-day free card, samples returned to inventory

## 2023-03-04 NOTE — Patient Instructions (Signed)
Medication Changes:  STOP  LOSARTAN   START: ENTRESTO 49/51MG  TWICE DAILY- SAMPLES GIVEN TODAY AND 30 DAY FREE CARD- PLEASE BRING IN MEDICAID DENIAL LETTER   START: SPIRONOLACTONE 12.5MG  ONCE DAILY   START: LASIX (FUROSEMIDE) 20MG  ONCE DAILY ONLY AS NEEDED FOR WEIGHT GAIN ON 3 POUNDS OVERNIGHT OR 5 POUNDS IN 1 WEEK   Lab Work:  Labs done today, your results will be available in MyChart, we will contact you for abnormal readings.  THEN RETURN IN 1 WEEK AS SCHEDULED FOR REPEAT LABS   Follow-Up in: 2-3 WEEKS AS SCHEDULED   At the Advanced Heart Failure Clinic, you and your health needs are our priority. We have a designated team specialized in the treatment of Heart Failure. This Care Team includes your primary Heart Failure Specialized Cardiologist (physician), Advanced Practice Providers (APPs- Physician Assistants and Nurse Practitioners), and Pharmacist who all work together to provide you with the care you need, when you need it.   You may see any of the following providers on your designated Care Team at your next follow up:  Dr. Arvilla Meres Dr. Marca Ancona Dr. Dorthula Nettles Dr. Theresia Bough Tonye Becket, NP Robbie Lis, [place] [facility] [place], [place] Brynda Peon, NP [place] Lee, NP Karle Plumber, PharmD   Please be sure to bring in all your medications bottles to every appointment.   Need to Contact us:  If you have any questions or concerns before your next appointment please send [place] a message through [place] or call our office at [phone].    TO LEAVE A MESSAGE FOR THE NURSE SELECT OPTION 2, PLEASE LEAVE A MESSAGE INCLUDING: YOUR NAME DATE OF BIRTH CALL BACK NUMBER REASON FOR CALL**this is important as we prioritize the call backs  YOU WILL RECEIVE A CALL BACK THE SAME DAY AS LONG AS YOU CALL BEFORE 4:00 PM

## 2023-03-09 ENCOUNTER — Telehealth (HOSPITAL_COMMUNITY): Payer: Self-pay

## 2023-03-09 MED ORDER — SPIRONOLACTONE 25 MG PO TABS: 25.0000 mg | ORAL_TABLET | Freq: Every day | ORAL | 3 refills | Status: DC

## 2023-03-09 NOTE — Telephone Encounter (Signed)
Patient advised and verbalized understanding. Med list updated to reflect changes.   Meds ordered this encounter  Medications   spironolactone (ALDACTONE) 25 MG tablet    Sig: Take 1 tablet (25 mg total) by mouth daily.    Dispense:  90 tablet    Refill:  3    Please cancel all previous orders for current medication. Change in dosage or pill size.

## 2023-03-09 NOTE — Telephone Encounter (Signed)
-----   Message from [place] sent at 03/07/2023  2:14 PM EST ----- K mildly low. We increased Entresto which should help w/ this. Also instruct pt to increase spiro to 25 mg daily. Needs f/u BMP in 1 wk

## 2023-03-14 ENCOUNTER — Telehealth (HOSPITAL_COMMUNITY): Payer: Self-pay | Admitting: Licensed Clinical Social Worker

## 2023-03-14 NOTE — Telephone Encounter (Signed)
Patient called to share that the disability papers have not yet been received by [employer]. CSW checked all fax machines and explained to patient that the HF clinic has never received the paperwork to complete. CSW contacted Loletta Parish and informed that they have sent to other [facility] offices accidentally and would fax to CSW today and extend the deadline for paperwork for another week. CSW received the paperwork and will have provider completed and fax back as soon as completed/signed. CSW continues to follow as needed. Lasandra Beech, LCSW, CCSW-MCS [phone]

## 2023-03-17 ENCOUNTER — Ambulatory Visit (HOSPITAL_COMMUNITY)
Admission: RE | Admit: 2023-03-17 | Discharge: 2023-03-17 | Disposition: A | Discharge status: Home / Self Care | Payer: No Typology Code available for payment source | Source: Ambulatory Visit | Attending: Cardiology | Admitting: Cardiology

## 2023-03-17 ENCOUNTER — Other Ambulatory Visit (HOSPITAL_COMMUNITY): Payer: Self-pay

## 2023-03-17 DIAGNOSIS — I5023 Acute on chronic systolic (congestive) heart failure: Secondary | ICD-10-CM | POA: Diagnosis present

## 2023-03-17 LAB — BASIC METABOLIC PANEL
Anion gap: 9 (ref 5–15)
BUN: 20 mg/dL (ref 6–20)
CO2: 27 mmol/L (ref 22–32)
Calcium: 10.1 mg/dL (ref 8.9–10.3)
Chloride: 98 mmol/L (ref 98–111)
Creatinine, Ser: 1.15 mg/dL (ref 0.61–1.24)
GFR, Estimated: >60 mL/min (ref >60)
Glucose, Bld: 167 mg/dL — ABNORMAL HIGH (ref 70–99)
Potassium: 4.7 mmol/L (ref 3.5–5.1)
Sodium: 134 mmol/L — ABNORMAL LOW (ref 135–145)

## 2023-03-23 ENCOUNTER — Other Ambulatory Visit (HOSPITAL_COMMUNITY): Payer: Self-pay

## 2023-03-23 ENCOUNTER — Other Ambulatory Visit: Payer: Self-pay

## 2023-03-23 NOTE — Progress Notes (Signed)
ADVANCED HEART FAILURE CLINIC NOTE  Referring Physician: No ref. provider found  Primary Care: Patient, No Pcp Per Primary Cardiologist:  HPI: Jimmy Parker is a 52 y.o. male with coronary artery disease (CTO of the RCA), heart failure with reduced EF, hypertension presenting today to establish care. His cardiac history dates back to atleast 2017 when LHC demonstrated CTO of the mid to distal RCA. At that time TTE w/ LVEF of 45-50%. He was admitted in 11/24 with NSTEMI with [facility] demonstrating 2 vessel obstructive CAD s/p PCI to the LAD. CI at that time of 2.2 L/min/m2. TTE w/ LVEF of 35%-40%.   He has since been seen in [facility] clinic where meds further uptitrated. Since that time,   Activity level/exercise tolerance:  *** Orthopnea:  Sleeps on *** pillows Paroxysmal noctural dyspnea:  *** Chest pain/pressure:  *** Orthostatic lightheadedness:  *** Palpitations:  *** Lower extremity edema:  *** Presyncope/syncope:  *** Cough:  ***  Past Medical History:  Diagnosis Date   CAD (coronary artery disease)    a. 07/2013 Neg MV;  b. 06/2015 NSTEMI/Cath:LM nl, LAD 20m,  D1/2/3 nl, RI mod/nl, LCX 31m, OM1 small, OM2 nl, RCA 20p, 125m, RPDA fills via collats from OM3, EF 35-45%.   CAD- occluded RCA with collaterals 06/24/2015   Chest pain 08/03/2013   CHF (congestive heart failure) (HCC)    Diabetes mellitus (HCC) 05/28/2008   Qualifier: Diagnosis of  By: Levon Hedger     Diet-controlled diabetes mellitus (HCC)    Dyslipidemia 06/23/2015   DYSMETABOLIC SYNDROME 08/13/2008   Qualifier: Diagnosis of  By: Daphine Deutscher FNP, Nykedtra     Heart attack [facility])    Hyperlipidemia    Hypertension    Hypertensive heart disease    Ischemic cardiomyopathy    a. 07/2013 Echo: EF 50-55%, no rwma, Gr1 DD;  b. 06/2015 EF 35-45% by Vgram.    NSTEMI (non-ST elevated myocardial infarction) (HCC) 06/23/2015   Tobacco abuse    a. quit 06/2015.   TOBACCO ABUSE 08/05/2009   Qualifier: Diagnosis of  By: Levon Hedger       Current Outpatient Medications  Medication Sig Dispense Refill   aspirin 81 MG chewable tablet Chew 1 tablet (81 mg total) by mouth daily. 90 tablet 2   atorvastatin (LIPITOR) 80 MG tablet Take 1 tablet (80 mg total) by mouth daily. 90 tablet 1   empagliflozin (JARDIANCE) 10 MG TABS tablet Take 1 tablet (10 mg total) by mouth daily. 30 tablet 0   furosemide (LASIX) 20 MG tablet Take 1 tablet (20 mg total) by mouth daily as needed (as needed for weight gain/swelling). 30 tablet 3   nicotine polacrilex (NICORETTE) 2 MG gum Take 1 each (2 mg total) by mouth as needed for smoking cessation. (Patient not taking: Reported on 03/04/2023) 100 tablet 3   nitroGLYCERIN (NITROSTAT) 0.4 MG SL tablet Place 1 tablet (0.4 mg total) under the tongue every 5 (five) minutes as needed. 25 tablet 2   prasugrel (EFFIENT) 10 MG TABS tablet Take 1 tablet (10 mg total) by mouth daily. 30 tablet 11   sacubitril-valsartan (ENTRESTO) 49-51 MG Take 1 tablet by mouth 2 (two) times daily. 60 tablet 3   spironolactone (ALDACTONE) 25 MG tablet Take 1 tablet (25 mg total) by mouth daily. 90 tablet 3   No current facility-administered medications for this visit.    No Known Allergies    Social History   Socioeconomic History   Marital status: Single  Spouse name: Not on file   Number of children: 1   Years of education: Not on file   Highest education level: High school graduate  Occupational History   Occupation: [employer]  Tobacco Use   Smoking status: Former    Current packs/day: 0.50    Average packs/day: 0.5 packs/day for 7.7 years (3.9 ttl pk-yrs)    Types: Cigarettes    Start date: 06/27/2015   Smokeless tobacco: Never  Vaping Use   Vaping status: Never Used  Substance and Sexual Activity   Alcohol use: No    Alcohol/week: 0.0 standard drinks of alcohol   Drug use: No   Sexual activity: Not on file  Other Topics Concern   Not on file  Social History Narrative   Not on file   Social  Determinants of Health   Financial Resource Strain: Low Risk  (02/21/2023)   Overall Financial Resource Strain (CARDIA)    Difficulty of Paying Living Expenses: Not very hard  Food Insecurity: No Food Insecurity (02/21/2023)   Hunger Vital Sign    Worried About Running Out of Food in the Last Year: Never true    Ran Out of Food in the Last Year: Never true  Transportation Needs: No Transportation Needs (02/21/2023)   PRAPARE - Administrator, Civil Service (Medical): No    Lack of Transportation (Non-Medical): No  Physical Activity: Not on file  Stress: Not on file  Social Connections: Not on file  Intimate Partner Violence: Not At Risk (02/18/2023)   Humiliation, Afraid, Rape, and Kick questionnaire    Fear of Current or Ex-Partner: No    Emotionally Abused: No    Physically Abused: No    Sexually Abused: No      Family History  Problem Relation Age of Onset   Heart attack Maternal Grandfather    Hypertension Maternal Grandfather    Heart attack Paternal Grandmother     PHYSICAL EXAM: There were no vitals filed for this visit. GENERAL: Well nourished, well developed, and in no apparent distress at rest.  HEENT: Negative for arcus senilis or xanthelasma. There is no scleral icterus.  The mucous membranes are pink and moist.   NECK: Supple, No masses. Normal carotid upstrokes without bruits. No masses or thyromegaly.    CHEST: There are no chest wall deformities. There is no chest wall tenderness. Respirations are unlabored.  Lungs- *** CARDIAC:  JVP: *** cm H2O         {HEART SOUNDS:22645}  Normal rate with regular rhythm. No murmurs, rubs or gallops.  Pulses are 2+ and symmetrical in upper and lower extremities. *** edema.  ABDOMEN: Soft, non-tender, non-distended. There are no masses or hepatomegaly. There are normal bowel sounds.  EXTREMITIES: Warm and well perfused with no cyanosis, clubbing.  LYMPHATIC: No axillary or supraclavicular lymphadenopathy.   NEUROLOGIC: Patient is oriented x3 with no focal or lateralizing neurologic deficits.  PSYCH: Patients affect is appropriate, there is no evidence of anxiety or depression.  SKIN: Warm and dry; no lesions or wounds.   DATA REVIEW  ECG: ***  As per my personal interpretation  ECHO: 02/18/23: LVEF 35%-40%, moderately reduced RV function 07/08/15: LVEF 45%-50%, inferior hypokinesis  CATH: LHC 02/18/23:  2 vessel obstructive CAD. 90% mid LCx. 100% proximal to mid RCA CTO with left to right collaterals. Moderate LV dysfunction. EF estimated at 40%. Global dysfunction Normal LV filling pressures. LVEDP 9 mm Hg. PCWP 19/18 with mean 15 mm Hg Normal PAP 44/12,  mean 21 mm Hg Reduced cardiac output. 3.78 L/min index 2.17 Successful PCI of the mid LCx with DES  LHC 2017:  Prox RCA lesion, 20% stenosed. Mid RCA lesion, 100% stenosed. Mid Cx lesion, 20% stenosed. Prox LAD to Mid LAD lesion, 20% stenosed. There is moderate left ventricular systolic dysfunction.   ASSESSMENT & PLAN:  Heart failure with reduced EF Etiology of ZO:[id number] cardiomyopathy dating back to CTO of the RCA in 2017 NYHA class / AHA Stage:*** Volume status & Diuretics: *** Vasodilators:entresto 49/51mg  BID Beta-Blocker:*** UJW:[id number] 25mg  daily Cardiometabolic:jardiance 10mg  daily Devices therapies & Valvulopathies:*** Advanced therapies:***  2. CAD - LHC as above with multivessel CAD including CTO of the mid RCA - Lipitor 80mg  daily - ASA 81mg  daily  3. HTN - entresto 49/51mg  BID - repeat BMP/BNP today    Gawain Crombie Advanced Heart Failure Mechanical Circulatory Support

## 2023-03-24 ENCOUNTER — Ambulatory Visit (HOSPITAL_COMMUNITY)
Admission: RE | Admit: 2023-03-24 | Discharge: 2023-03-24 | Disposition: A | Discharge status: Home / Self Care | Payer: No Typology Code available for payment source | Source: Ambulatory Visit | Attending: Cardiology | Admitting: Cardiology

## 2023-03-24 ENCOUNTER — Other Ambulatory Visit (HOSPITAL_COMMUNITY): Payer: Self-pay

## 2023-03-24 ENCOUNTER — Encounter (HOSPITAL_COMMUNITY): Payer: Self-pay | Admitting: Cardiology

## 2023-03-24 VITALS — BP 148/88 | HR 110 | Ht 66.0 in | Wt 137.6 lb

## 2023-03-24 DIAGNOSIS — Z87891 Personal history of nicotine dependence: Secondary | ICD-10-CM | POA: Diagnosis not present

## 2023-03-24 DIAGNOSIS — I119 Hypertensive heart disease without heart failure: Secondary | ICD-10-CM | POA: Diagnosis not present

## 2023-03-24 DIAGNOSIS — Z7982 Long term (current) use of aspirin: Secondary | ICD-10-CM | POA: Insufficient documentation

## 2023-03-24 DIAGNOSIS — I252 Old myocardial infarction: Secondary | ICD-10-CM | POA: Insufficient documentation

## 2023-03-24 DIAGNOSIS — I509 Heart failure, unspecified: Secondary | ICD-10-CM | POA: Insufficient documentation

## 2023-03-24 DIAGNOSIS — I110 Hypertensive heart disease with heart failure: Secondary | ICD-10-CM | POA: Insufficient documentation

## 2023-03-24 DIAGNOSIS — E785 Hyperlipidemia, unspecified: Secondary | ICD-10-CM | POA: Insufficient documentation

## 2023-03-24 DIAGNOSIS — Z955 Presence of coronary angioplasty implant and graft: Secondary | ICD-10-CM | POA: Diagnosis not present

## 2023-03-24 DIAGNOSIS — I2510 Atherosclerotic heart disease of native coronary artery without angina pectoris: Secondary | ICD-10-CM | POA: Insufficient documentation

## 2023-03-24 DIAGNOSIS — I255 Ischemic cardiomyopathy: Secondary | ICD-10-CM | POA: Diagnosis not present

## 2023-03-24 DIAGNOSIS — Z79899 Other long term (current) drug therapy: Secondary | ICD-10-CM | POA: Insufficient documentation

## 2023-03-24 DIAGNOSIS — I5022 Chronic systolic (congestive) heart failure: Secondary | ICD-10-CM | POA: Diagnosis not present

## 2023-03-24 LAB — BASIC METABOLIC PANEL
Anion gap: 9 (ref 5–15)
BUN: 19 mg/dL (ref 6–20)
CO2: 28 mmol/L (ref 22–32)
Calcium: 10.7 mg/dL — ABNORMAL HIGH (ref 8.9–10.3)
Chloride: 99 mmol/L (ref 98–111)
Creatinine, Ser: 1.13 mg/dL (ref 0.61–1.24)
GFR, Estimated: >60 mL/min (ref >60)
Glucose, Bld: 181 mg/dL — ABNORMAL HIGH (ref 70–99)
Potassium: 4.9 mmol/L (ref 3.5–5.1)
Sodium: 136 mmol/L (ref 135–145)

## 2023-03-24 LAB — BRAIN NATRIURETIC PEPTIDE: B Natriuretic Peptide: 44.2 pg/mL (ref 0.0–100.0)

## 2023-03-24 MED ORDER — ENTRESTO 49-51 MG PO TABS: 1.0000 | ORAL_TABLET | Freq: Two times a day (BID) | ORAL | 3 refills | Status: DC

## 2023-03-24 MED ORDER — METOPROLOL SUCCINATE ER 25 MG PO TB24
12.5000 mg | ORAL_TABLET | Freq: Every day | ORAL | 3 refills | Status: AC
Start: 2023-03-24 — End: ?

## 2023-03-24 MED ORDER — ENTRESTO 97-103 MG PO TABS: 1.0000 | ORAL_TABLET | Freq: Two times a day (BID) | ORAL | 3 refills | Status: DC

## 2023-03-24 NOTE — Patient Instructions (Addendum)
Medication Changes:  KEEP ENTRESTO DOSE AT 49/51MG  FOR NOW- UNTIL INSURANCE CONFIRMATION   START: METOPROLOL SUCCINATE 12.5MG  ONCE DAILY   Lab Work:  Labs done today, your results will be available in MyChart, we will contact you for abnormal readings.  Follow-Up in: 2 months as scheduled with Dr. Gasper Lloyd   At the Advanced Heart Failure Clinic, you and your health needs are our priority. We have a designated team specialized in the treatment of Heart Failure. This Care Team includes your primary Heart Failure Specialized Cardiologist (physician), Advanced Practice Providers (APPs- Physician Assistants and Nurse Practitioners), and Pharmacist who all work together to provide you with the care you need, when you need it.   You may see any of the following providers on your designated Care Team at your next follow up:  Dr. Arvilla Meres Dr. Marca Ancona Dr. Dorthula Nettles Dr. Theresia Bough Tonye Becket, NP Robbie Lis, [place] [facility] [place], [place] Brynda Peon, NP [place] Lee, NP Karle Plumber, PharmD   Please be sure to bring in all your medications bottles to every appointment.   Need to Contact us:  If you have any questions or concerns before your next appointment please send [place] a message through [place] or call our office at [phone].    TO LEAVE A MESSAGE FOR THE NURSE SELECT OPTION 2, PLEASE LEAVE A MESSAGE INCLUDING: YOUR NAME DATE OF BIRTH CALL BACK NUMBER REASON FOR CALL**this is important as we prioritize the call backs  YOU WILL RECEIVE A CALL BACK THE SAME DAY AS LONG AS YOU CALL BEFORE 4:00 PM

## 2023-03-24 NOTE — Progress Notes (Signed)
Samples of ENTRESTO 49/51MG  were given to the patient, quantity 2 BOTTLES, Lot Number [id number]

## 2023-03-25 ENCOUNTER — Other Ambulatory Visit (HOSPITAL_COMMUNITY): Payer: Self-pay

## 2023-03-28 ENCOUNTER — Telehealth (HOSPITAL_COMMUNITY): Payer: Self-pay

## 2023-03-28 ENCOUNTER — Other Ambulatory Visit (HOSPITAL_COMMUNITY): Payer: Self-pay

## 2023-03-28 NOTE — Telephone Encounter (Signed)
Advanced Heart Failure Patient Advocate Encounter  Prior authorization for Sherryll Burger has been submitted and approved. Test billing returns $0 for 90 day supply.  [employer] Effective: 03/24/2023 to 03/23/2024  Burnell Blanks, CPhT Rx Patient Advocate Phone: [phone]

## 2023-04-14 ENCOUNTER — Other Ambulatory Visit (HOSPITAL_COMMUNITY): Payer: Self-pay | Admitting: Cardiology

## 2023-04-14 MED ORDER — ATORVASTATIN CALCIUM 80 MG PO TABS: 80.0000 mg | ORAL_TABLET | Freq: Every day | ORAL | 1 refills | Status: DC

## 2023-04-18 NOTE — Progress Notes (Signed)
 Cardiology Clinic Note   Patient Name: Jimmy Parker Date of Encounter: 04/21/2023  Primary Care Provider:  Patient, No Pcp Per Primary Cardiologist:  Redell Shallow, MD  Patient Profile    Jimmy Parker 53 year old male presents the clinic today for follow-up evaluation of his coronary artery disease and essential hypertension.  Past Medical History    Past Medical History:  Diagnosis Date   CAD (coronary artery disease)    a. 07/2013 Neg MV;  b. 06/2015 NSTEMI/Cath:LM nl, LAD 10m,  D1/2/3 nl, RI mod/nl, LCX 23m, OM1 small, OM2 nl, RCA 20p, 159m, RPDA fills via collats from OM3, EF 35-45%.   CAD- occluded RCA with collaterals 06/24/2015   Chest pain 08/03/2013   CHF (congestive heart failure) (HCC)    Diabetes mellitus (HCC) 05/28/2008   Qualifier: Diagnosis of  By: Kearney America     Diet-controlled diabetes mellitus (HCC)    Dyslipidemia 06/23/2015   DYSMETABOLIC SYNDROME 08/13/2008   Qualifier: Diagnosis of  By: Gladis FNP, Nykedtra     Heart attack [facility])    Hyperlipidemia    Hypertension    Hypertensive heart disease    Ischemic cardiomyopathy    a. 07/2013 Echo: EF 50-55%, no rwma, Gr1 DD;  b. 06/2015 EF 35-45% by Vgram.    NSTEMI (non-ST elevated myocardial infarction) (HCC) 06/23/2015   Tobacco abuse    a. quit 06/2015.   TOBACCO ABUSE 08/05/2009   Qualifier: Diagnosis of  By: Kearney America     Past Surgical History:  Procedure Laterality Date   CARDIAC CATHETERIZATION N/A 06/23/2015   Procedure: Left Heart Cath and Coronary Angiography;  Surgeon: Lonni JONETTA Cash, MD;  Location: [facility] INVASIVE CV LAB;  Service: Cardiovascular;  Laterality: N/A;   CORONARY STENT INTERVENTION N/A 02/18/2023   Procedure: CORONARY STENT INTERVENTION;  Surgeon: Jordan, Peter M, MD;  Location: [facility] INVASIVE CV LAB;  Service: Cardiovascular;  Laterality: N/A;   FRACTURE SURGERY     femur and hip   RIGHT/LEFT HEART CATH AND CORONARY ANGIOGRAPHY N/A 02/18/2023   Procedure: RIGHT/LEFT HEART  CATH AND CORONARY ANGIOGRAPHY;  Surgeon: Jordan, Peter M, MD;  Location: [facility] INVASIVE CV LAB;  Service: Cardiovascular;  Laterality: N/A;   SHOULDER SURGERY     Right: Patient denies any hx of shoulder surgery    Allergies  No Known Allergies  History of Present Illness    Jimmy Parker has a PMH of diabetes, NSTEMI, ischemic cardiomyopathy, tobacco abuse, acute on chronic systolic CHF, elevated liver enzymes, and upper abdominal pain.  He had NSTEMI 2017.  He was admitted to the hospital on 02/18/2023 and discharged on 02/21/2023.  Cardiology was consulted on 02/18/2023 for evaluation of epigastric pain.  He reported that he was feeling well until 02/14/2023.  He works in production designer, theatre/television/film.  The Monday prior to presenting to the hospital he noted stomach discomfort and associated fatigue.  He felt his symptoms moved to his lower chest at times.  He developed shortness of breath and orthopnea.  He missed work for 48 hours.  He attempted to go back to work but noted persistent symptoms.  He had persistent nausea but no vomiting.  He was spitting up saliva.  He felt chills but no fevers.  He did note loose stools.  He denied history of cholecystectomy or appendectomy.  He reported that his previous MI symptoms in 2017 were similar which prompted him to go to the emergency department.  His temperature was noted to be 99.9.  His blood pressure was 110-140 over 70s and 80s.  His BMP was unremarkable.  His LFTs showed an AST of 93 and ALT of 179.  His CBC was unremarkable.  His EKG showed junctional bradycardia with T wave inversion in V4 through V6 with T wave flattening in 1 and aVL.  His high-sensitivity troponins were noted to be 2740.  He underwent cardiac catheterization on 02/18/2023 which showed a complete occlusion of mid RCA with left-to-right collaterals, mild nonobstructive disease in his LAD and circumflex.  He received PCI with DES to his mid circumflex.  He was placed on dual antiplatelet therapy  (aspirin , Brilinta ).  His echocardiogram at that time showed an EF of 35-40%, moderate reduced RV function, moderately dilated right atrium, and moderate-severe TR.  His GDMT was unable to be titrated due to bradycardia.  He was continued on Jardiance , losartan  12.5 mg daily, with plans to transition to Entresto  as an outpatient if able.  He presented to the clinic 02/28/23 for follow-up evaluation and stated he felt he had better blood pressure before starting medications.  We reviewed his cardiac catheterization and heart failure.  We also reviewed his cardiac catheterization from March 2017.  He reported that he felt Lied to.  About his previous cardiac catheterization.  I reviewed GDMT titration.  He did report an episode of chest discomfort earlier that morning with walking.  It dissipated without intervention as he continued to walk.   I filled out his FMLA paperwork.  We reviewed his upcoming visit with heart failure.  I increased his losartan  to 25 mg daily, planned repeat BMP in 1 week and repeat fasting lipids and LFTs in 6 to 8 weeks.  I planned follow-up in 1-2 months  He continue to follow-up with the advanced heart failure team.  He was seen 03/24/2023 by Dr. Gardenia.  He reported that with up titration of cardiac medications he felt tired and fatigued.  He indicated that he wished he could go back to work.  He was no longer having significant chest pain, lower extremity swelling or shortness of breath.  He was taking furosemide  20 mg as needed, Entresto  49/51 twice daily, metoprolol  succinate 12.5 mg daily, spironolactone  25 mg daily, Jardiance  10 mg daily.  Return to work restrictions were discussed.  Follow-up was planned for 06/02/2023.  He presents to the clinic today for follow-up evaluation and states he has been having lower extremity pain and back discomfort.  He also notes intermittent episodes of chest discomfort.  These do not appear to be related to cardiac issues.  We reviewed his  previous cardiac visit and his advanced heart failure visit.  He expressed understanding.  His blood pressure today is 142/82 and on recheck was noted to be 138/78.  We discussed GDMT up titration.  I will increase his Entresto  to 97 103 twice daily, order a BMP in 1 to 2 weeks, refill his nitroglycerin  and plan follow-up in around 2 months.  Today he denies  shortness of breath, lower extremity edema, fatigue, palpitations, melena, hematuria, hemoptysis, diaphoresis, weakness, presyncope, syncope, orthopnea, and PND.     Home Medications    Prior to Admission medications   Medication Sig Start Date End Date Taking? Authorizing Provider  aspirin  81 MG chewable tablet Chew 1 tablet (81 mg total) by mouth daily. 02/21/23   Henry Manuelita NOVAK, NP  atorvastatin  (LIPITOR ) 80 MG tablet Take 1 tablet (80 mg total) by mouth daily. 02/21/23   Henry Manuelita NOVAK, NP  empagliflozin  (JARDIANCE ) 10 MG TABS tablet Take 1 tablet (10 mg total) by mouth daily. 02/20/23   Croitoru, Mihai, MD  losartan  (COZAAR ) 25 MG tablet Take 0.5 tablets (12.5 mg total) by mouth daily. 02/21/23   Henry Manuelita NOVAK, NP  nicotine  polacrilex (NICORETTE ) 2 MG gum Take 1 each (2 mg total) by mouth as needed for smoking cessation. 04/01/22   Vicci Barnie NOVAK, MD  nitroGLYCERIN  (NITROSTAT ) 0.4 MG SL tablet Place 1 tablet (0.4 mg total) under the tongue every 5 (five) minutes as needed. 02/21/23   Henry Manuelita NOVAK, NP  prasugrel  (EFFIENT ) 10 MG TABS tablet Take 1 tablet (10 mg total) by mouth daily. 02/21/23   Henry Manuelita NOVAK, NP    Family History    Family History  Problem Relation Age of Onset   Heart attack Maternal Grandfather    Hypertension Maternal Grandfather    Heart attack Paternal Grandmother    He indicated that his mother is alive. He indicated that his father is deceased. He indicated that the status of his maternal grandfather is unknown. He indicated that the status of his paternal grandmother is  unknown.  Social History    Social History   Socioeconomic History   Marital status: Single    Spouse name: Not on file   Number of children: 1   Years of education: Not on file   Highest education level: High school graduate  Occupational History   Occupation: [employer]  Tobacco Use   Smoking status: Former    Current packs/day: 0.50    Average packs/day: 0.5 packs/day for 7.8 years (3.9 ttl pk-yrs)    Types: Cigarettes    Start date: 06/27/2015   Smokeless tobacco: Never  Vaping Use   Vaping status: Never Used  Substance and Sexual Activity   Alcohol use: No    Alcohol/week: 0.0 standard drinks of alcohol   Drug use: No   Sexual activity: Not on file  Other Topics Concern   Not on file  Social History Narrative   Not on file   Social Drivers of Health   Financial Resource Strain: Low Risk  (02/21/2023)   Overall Financial Resource Strain (CARDIA)    Difficulty of Paying Living Expenses: Not very hard  Food Insecurity: No Food Insecurity (02/21/2023)   Hunger Vital Sign    Worried About Running Out of Food in the Last Year: Never true    Ran Out of Food in the Last Year: Never true  Transportation Needs: No Transportation Needs (02/21/2023)   PRAPARE - Administrator, Civil Service (Medical): No    Lack of Transportation (Non-Medical): No  Physical Activity: Not on file  Stress: Not on file  Social Connections: Not on file  Intimate Partner Violence: Not At Risk (02/18/2023)   Humiliation, Afraid, Rape, and Kick questionnaire    Fear of Current or Ex-Partner: No    Emotionally Abused: No    Physically Abused: No    Sexually Abused: No     Review of Systems    General:  No chills, fever, night sweats or weight changes.  Cardiovascular:  No chest pain, dyspnea on exertion, edema, orthopnea, palpitations, paroxysmal nocturnal dyspnea. Dermatological: No rash, lesions/masses Respiratory: No cough, dyspnea Urologic: No hematuria, dysuria Abdominal:    No nausea, vomiting, diarrhea, bright red blood per rectum, melena, or hematemesis Neurologic:  No visual changes, wkns, changes in mental status. All other systems reviewed and are otherwise negative except as noted above.  Physical  Exam    VS:  BP 138/78   Pulse 97   Ht 5' 7 (1.702 m)   Wt 140 lb 6.4 oz (63.7 kg)   SpO2 98%   BMI 21.99 kg/m  , BMI Body mass index is 21.99 kg/m. GEN: Well nourished, well developed, in no acute distress. HEENT: normal. Neck: Supple, no JVD, carotid bruits, or masses. Cardiac: RRR, no murmurs, rubs, or gallops. No clubbing, cyanosis, edema.  Radials/DP/PT 2+ and equal bilaterally.  Respiratory:  Respirations regular and unlabored, clear to auscultation bilaterally. GI: Soft, nontender, nondistended, BS + x 4. MS: no deformity or atrophy. Skin: warm and dry, no rash.  Right radial cath site healed well no signs of infection. Neuro:  Strength and sensation are intact. Psych: Normal affect.  Accessory Clinical Findings    Recent Labs: 02/17/2023: ALT 179 02/18/2023: Magnesium 2.2; TSH 3.228 02/20/2023: Hemoglobin 13.9; Platelets 164 03/24/2023: B Natriuretic Peptide 44.2; BUN 19; Creatinine, Ser 1.13; Potassium 4.9; Sodium 136   Recent Lipid Panel    Component Value Date/Time   CHOL 118 02/18/2023 0550   CHOL 180 04/01/2022 1133   TRIG 33 02/18/2023 0550   HDL 36 (L) 02/18/2023 0550   HDL 43 04/01/2022 1133   CHOLHDL 3.3 02/18/2023 0550   VLDL 7 02/18/2023 0550   LDLCALC 75 02/18/2023 0550   LDLCALC 114 (H) 04/01/2022 1133         ECG personally reviewed by me today-  none today.  Echocardiogram 02/18/2023 IMPRESSIONS     1. Left ventricular ejection fraction, by estimation, is 35 to 40%. The  left ventricle has moderately decreased function. The left ventricle  demonstrates global hypokinesis. There is mild left ventricular  hypertrophy. Left ventricular diastolic  parameters are indeterminate.   2. Right ventricular systolic  function is moderately reduced. The right  ventricular size is normal. There is mildly elevated pulmonary artery  systolic pressure.   3. Right atrial size was moderately dilated.   4. Trivial mitral valve regurgitation.   5. Tricuspid valve regurgitation is moderate to severe.   6. The aortic valve is tricuspid. Aortic valve regurgitation is trivial.   FINDINGS   Left Ventricle: Left ventricular ejection fraction, by estimation, is 35  to 40%. The left ventricle has moderately decreased function. The left  ventricle demonstrates global hypokinesis. The left ventricular internal  cavity size was normal in size.  There is mild left ventricular hypertrophy. Left ventricular diastolic  parameters are indeterminate.   Right Ventricle: The right ventricular size is normal. Right vetricular  wall thickness was not assessed. Right ventricular systolic function is  moderately reduced. There is mildly elevated pulmonary artery systolic  pressure. The tricuspid regurgitant  velocity is 2.66 m/s, and with an assumed right atrial pressure of 15  mmHg, the estimated right ventricular systolic pressure is 43.3 mmHg.   Left Atrium: Left atrial size was normal in size.   Right Atrium: Right atrial size was moderately dilated.   Pericardium: There is no evidence of pericardial effusion.   Mitral Valve: There is mild thickening of the mitral valve leaflet(s).  Trivial mitral valve regurgitation.   Tricuspid Valve: The tricuspid valve is normal in structure. Tricuspid  valve regurgitation is moderate to severe.   Aortic Valve: The aortic valve is tricuspid. Aortic valve regurgitation is  trivial. Aortic valve mean gradient measures 3.0 mmHg. Aortic valve peak  gradient measures 5.7 mmHg. Aortic valve area, by VTI measures 1.71 cm.   Pulmonic Valve: The pulmonic valve  was grossly normal. Pulmonic valve  regurgitation is not visualized.   Aorta: The aortic root is normal in size and structure.    IAS/Shunts: No atrial level shunt detected by color flow Doppler.   Cardiac catheterization 02/18/2023  2 vessel obstructive CAD. 90% mid LCx. 100% proximal to mid RCA CTO with left to right collaterals. Moderate LV dysfunction. EF estimated at 40%. Global dysfunction Normal LV filling pressures. LVEDP 9 mm Hg. PCWP 19/18 with mean 15 mm Hg Normal PAP 44/12, mean 21 mm Hg Reduced cardiac output. 3.78 L/min index 2.17 Successful PCI of the mid LCx with DES   Plan: DAPT for one year. Anticipate DC tomorrow. Optimize medical therapy for CHF. RCA occlusion is not suited for CTO PCI- treat medically   Diagnostic Dominance: Right  Intervention        Assessment & Plan   1.  CAD-no chest pain today.  With cardiac catheterization 02/18/2023 with CTO of RCA and PCI with DES x 1 to his circumflex.  He was noted to have 35% proximal LAD stenosis and left to right collaterals.  Details above. Continue aspirin , Effient , atorvastatin , metoprolol  succinate Heart healthy low-sodium diet Increase physical activity as tolerated Refill nitroglycerin   Hyperlipidemia-LDL 75 on 02/18/23. High-fiber diet Atorvastatin , aspirin  Increase physical activity as tolerated Repeat fasting lipids and LFTs in 6-8 weeks  Acute on chronic systolic CHF-euvolemic.  Weight today 140 pounds.  BP today 138/78.  EF noted to be 30-35% on 02/18/2023.   Increase Entresto  to 97/103 twice daily Continue Jardiance , furosemide , metoprolol , spironolactone  Heart healthy low-sodium diet Increase physical activity as tolerated Following with advanced heart failure team-follow-up as scheduled Repeat BMP in 1-2 weeks  Essential hypertension-BP today 138/78 Maintain blood pressure log Continue metoprolol , spironolactone  Entresto  97/103 twice daily  Type 2 diabetes-glucose 181 on 03/24/23 Carb modified diet Continue Jardiance  Follows with PCP  Tobacco abuse-reports that nicotine  gum is not helping.  Smoking about 4  cigarettes/day. I will prescribe nicotine  patches Smoking cessation strongly encouraged  Disposition: Follow-up with Dr. Pietro or me in 2-3 months.   Josefa HERO. Cammy Sanjurjo NP-C     04/21/2023, 3:46 PM [facility] [address] Office [phone] Fax [phone]    I spent 14 minutes examining this patient, reviewing medications, and using patient centered shared decision making involving his cardiac care.   I spent greater than 20 minutes reviewing his past medical history,  medications, and prior cardiac tests.

## 2023-04-21 ENCOUNTER — Ambulatory Visit: Payer: Medicaid Other | Attending: General Practice | Admitting: General Practice

## 2023-04-21 ENCOUNTER — Encounter: Payer: Self-pay | Admitting: General Practice

## 2023-04-21 VITALS — BP 138/78 | HR 97 | Ht 67.0 in | Wt 140.4 lb

## 2023-04-21 DIAGNOSIS — I119 Hypertensive heart disease without heart failure: Secondary | ICD-10-CM

## 2023-04-21 DIAGNOSIS — I2510 Atherosclerotic heart disease of native coronary artery without angina pectoris: Secondary | ICD-10-CM | POA: Diagnosis not present

## 2023-04-21 DIAGNOSIS — I5022 Chronic systolic (congestive) heart failure: Secondary | ICD-10-CM | POA: Diagnosis not present

## 2023-04-21 DIAGNOSIS — E782 Mixed hyperlipidemia: Secondary | ICD-10-CM | POA: Diagnosis not present

## 2023-04-21 DIAGNOSIS — I10 Essential (primary) hypertension: Secondary | ICD-10-CM | POA: Diagnosis not present

## 2023-04-21 MED ORDER — NITROGLYCERIN 0.4 MG SL SUBL: 0.4000 mg | SUBLINGUAL_TABLET | SUBLINGUAL | 2 refills | Status: AC | PRN

## 2023-04-21 MED ORDER — NICOTINE 7 MG/24HR TD PT24: 7.0000 mg | MEDICATED_PATCH | Freq: Every day | TRANSDERMAL | 0 refills | Status: DC

## 2023-04-21 MED ORDER — NICOTINE 14 MG/24HR TD PT24: 14.0000 mg | MEDICATED_PATCH | Freq: Every day | TRANSDERMAL | 0 refills | Status: DC

## 2023-04-21 MED ORDER — SACUBITRIL-VALSARTAN 97-103 MG PO TABS: 1.0000 | ORAL_TABLET | Freq: Two times a day (BID) | ORAL | 3 refills | Status: DC

## 2023-04-21 MED ORDER — NICOTINE 21 MG/24HR TD PT24: 21.0000 mg | MEDICATED_PATCH | Freq: Every day | TRANSDERMAL | 0 refills | Status: DC

## 2023-04-21 NOTE — Patient Instructions (Signed)
 Medication Instructions:  INCRESTO ENTRESTO  97/103MG  TWICE DAILY USE NICODERM PATCHES 21 MG x 2 WEEKS; THEN 14 MG FOR 3 WEEKS; THEN 7 MG 7 WEEKS *If you need a refill on your cardiac medications before your next appointment, please call your pharmacy*  Lab Work: BMET IN 1-2 WEEKS If you have labs (blood work) drawn today and your tests are completely normal, you will receive your results only by: MyChart Message (if you have MyChart) OR A paper copy in the mail If you have any lab test that is abnormal or we need to change your treatment, we will call you to review the results.  Testing/Procedures: NONE  Follow-Up: At [facility], you and your health needs are our priority.  As part of our continuing mission to provide you with exceptional heart care, we have created designated Provider Care Teams.  These Care Teams include your primary Cardiologist (physician) and Advanced Practice Providers (APPs -  Physician Assistants and Nurse Practitioners) who all work together to provide you with the care you need, when you need it.  Your next appointment:   2-3 month(s)  Provider:   Redell Shallow, MD  or Josefa Beauvais, FNP-C        Other Instructions TAKE AN LOG YOUR BLOOD PRESSURE-AFTER MEDICATION

## 2023-05-13 LAB — BASIC METABOLIC PANEL
BUN/Creatinine Ratio: 13 (ref 9–20)
BUN: 12 mg/dL (ref 6–24)
CO2: 23 mmol/L (ref 20–29)
Calcium: 9.5 mg/dL (ref 8.7–10.2)
Chloride: 101 mmol/L (ref 96–106)
Creatinine, Ser: 0.9 mg/dL (ref 0.76–1.27)
Glucose: 168 mg/dL — ABNORMAL HIGH (ref 70–99)
Potassium: 4.9 mmol/L (ref 3.5–5.2)
Sodium: 139 mmol/L (ref 134–144)
eGFR: 101 mL/min/{1.73_m2} (ref >59)

## 2023-05-29 ENCOUNTER — Other Ambulatory Visit: Payer: Self-pay | Admitting: General Practice

## 2023-06-02 ENCOUNTER — Encounter (HOSPITAL_COMMUNITY): Payer: No Typology Code available for payment source | Admitting: Cardiology

## 2023-06-02 ENCOUNTER — Telehealth (HOSPITAL_COMMUNITY): Payer: Self-pay | Admitting: Cardiology

## 2023-06-02 NOTE — Telephone Encounter (Signed)
Front office received a voicemail from this patient about him needing to reschedule his appt with Dr. Gasper Lloyd on Thursday 06/02/23 at 2:00 PM.   Called patient at [phone] and left patient a voice message to call front office back to reschedule f/u appt.

## 2023-07-04 NOTE — Progress Notes (Unsigned)
 Cardiology Clinic Note   Patient Name: Jimmy Parker Date of Encounter: 07/07/2023  Primary Care Provider:  Patient, No Pcp Per Primary Cardiologist:  Olga Millers, MD  Patient Profile    Jimmy Parker 53 year old male presents the clinic today for follow-up evaluation of his coronary artery disease and essential hypertension.  Past Medical History    Past Medical History:  Diagnosis Date   CAD (coronary artery disease)    a. 07/2013 Neg MV;  b. 06/2015 NSTEMI/Cath:LM nl, LAD 18m,  D1/2/3 nl, RI mod/nl, LCX 34m, OM1 small, OM2 nl, RCA 20p, 154m, RPDA fills via collats from OM3, EF 35-45%.   CAD- occluded RCA with collaterals 06/24/2015   Chest pain 08/03/2013   CHF (congestive heart failure) (HCC)    Diabetes mellitus (HCC) 05/28/2008   Qualifier: Diagnosis of  By: Levon Hedger     Diet-controlled diabetes mellitus (HCC)    Dyslipidemia 06/23/2015   DYSMETABOLIC SYNDROME 08/13/2008   Qualifier: Diagnosis of  By: Daphine Deutscher FNP, [place]     Heart attack [facility])    Hyperlipidemia    Hypertension    Hypertensive heart disease    Ischemic cardiomyopathy    a. 07/2013 Echo: EF 50-55%, no rwma, Gr1 DD;  b. 06/2015 EF 35-45% by Vgram.    NSTEMI (non-ST elevated myocardial infarction) (HCC) 06/23/2015   Tobacco abuse    a. quit 06/2015.   TOBACCO ABUSE 08/05/2009   Qualifier: Diagnosis of  By: Levon Hedger     Past Surgical History:  Procedure Laterality Date   CARDIAC CATHETERIZATION N/A 06/23/2015   Procedure: Left Heart Cath and Coronary Angiography;  Surgeon: Kathleene Hazel, MD;  Location: [facility] INVASIVE CV LAB;  Service: Cardiovascular;  Laterality: N/A;   CORONARY STENT INTERVENTION N/A 02/18/2023   Procedure: CORONARY STENT INTERVENTION;  Surgeon: Swaziland, Peter M, MD;  Location: [facility] INVASIVE CV LAB;  Service: Cardiovascular;  Laterality: N/A;   FRACTURE SURGERY     femur and hip   RIGHT/LEFT HEART CATH AND CORONARY ANGIOGRAPHY N/A 02/18/2023   Procedure: RIGHT/LEFT HEART  CATH AND CORONARY ANGIOGRAPHY;  Surgeon: Swaziland, Peter M, MD;  Location: [facility] INVASIVE CV LAB;  Service: Cardiovascular;  Laterality: N/A;   SHOULDER SURGERY     Right: Patient denies any hx of shoulder surgery    Allergies  No Known Allergies  History of Present Illness    Jimmy Parker has a PMH of diabetes, NSTEMI, ischemic cardiomyopathy, tobacco abuse, acute on chronic systolic CHF, elevated liver enzymes, and upper abdominal pain.  He had NSTEMI 2017.  He was admitted to the hospital on 02/18/2023 and discharged on 02/21/2023.  Cardiology was consulted on 02/18/2023 for evaluation of epigastric pain.  He reported that he was feeling well until 02/14/2023.  He works in Production designer, theatre/television/film.  The Monday prior to presenting to the hospital he noted stomach discomfort and associated fatigue.  He felt his symptoms moved to his lower chest at times.  He developed shortness of breath and orthopnea.  He missed work for 48 hours.  He attempted to go back to work but noted persistent symptoms.  He had persistent nausea but no vomiting.  He was spitting up saliva.  He felt chills but no fevers.  He did note loose stools.  He denied history of cholecystectomy or appendectomy.  He reported that his previous MI symptoms in 2017 were similar which prompted him to go to the emergency department.  His temperature was noted to be 99.9.  His blood pressure was 110-140 over 70s and 80s.  His BMP was unremarkable.  His LFTs showed an AST of 93 and ALT of 179.  His CBC was unremarkable.  His EKG showed junctional bradycardia with T wave inversion in V4 through V6 with T wave flattening in 1 and aVL.  His high-sensitivity troponins were noted to be 2740.  He underwent cardiac catheterization on 02/18/2023 which showed a complete occlusion of mid RCA with left-to-right collaterals, mild nonobstructive disease in his LAD and circumflex.  He received PCI with DES to his mid circumflex.  He was placed on dual antiplatelet therapy  (aspirin, Brilinta).  His echocardiogram at that time showed an EF of 35-40%, moderate reduced RV function, moderately dilated right atrium, and moderate-severe TR.  His GDMT was unable to be titrated due to bradycardia.  He was continued on Jardiance, losartan 12.5 mg daily, with plans to transition to [facility] as an outpatient if able.  He presented to the clinic 02/28/23 for follow-up evaluation and stated he felt he had better blood pressure before starting medications.  We reviewed his cardiac catheterization and heart failure.  We also reviewed his cardiac catheterization from March 2017.  He reported that he felt Lied to".  About his previous cardiac catheterization.  I reviewed GDMT titration.  He did report an episode of chest discomfort earlier that morning with walking.  It dissipated without intervention as he continued to walk.   I filled out his FMLA paperwork.  We reviewed his upcoming visit with heart failure.  I increased his losartan to 25 mg daily, planned repeat BMP in 1 week and repeat fasting lipids and LFTs in 6 to 8 weeks.  I planned follow-up in 1-2 months  He continue to follow-up with the advanced heart failure team.  He was seen 03/24/2023 by Dr. Gasper Lloyd.  He reported that with up titration of cardiac medications he felt tired and fatigued.  He indicated that he wished he could go back to work.  He was no longer having significant chest pain, lower extremity swelling or shortness of breath.  He was taking furosemide 20 mg as needed, Entresto 49/51 twice daily, metoprolol succinate 12.5 mg daily, spironolactone 25 mg daily, Jardiance 10 mg daily.  Return to work restrictions were discussed.  Follow-up was planned for 06/02/2023.  He presented to the clinic 04/21/23 for follow-up evaluation and stated he had been having lower extremity pain and back discomfort.  He also noted intermittent episodes of chest discomfort which did not appear to be related to cardiac issues.  We reviewed  his previous cardiac visit and his advanced heart failure visit.  He expressed understanding.  His blood pressure was 142/82 and on recheck was noted to be 138/78.  We discussed GDMT up titration.  I  increased his Entresto to 97/ 103 twice daily, order a BMP in 1 to 2 weeks, refilled his nitroglycerin and planned follow-up in around 2 months.  His follow-up BMP showed stable renal function and electrolytes.  He presents to the clinic today for follow-up evaluation and states he is having pain in his left shoulder back and down his left arm.  He is also noticing pain in his left leg.  He reports that the pain has been present for about a week and a half.  He notes that when he extends his arm he has tingling in his fingers on his left hand.  He notes that he has only been taking his Entresto once per  day.  We reviewed his medication list and he also reports that he has not been taking Effient.  We reviewed his hospitalization and the importance of medication compliance.  He expressed understanding.  I explained that his pain in his left arm and leg appear to be related to nerve type pain.  He asks about the possibility of having a blood clot.  We reviewed that he has no difference in the size of his lower extremities, no changes in his pulses and no inflammation present.  I will refill his a atorvastatin, Effient, Entresto, and spironolactone initially his blood pressure today is 150/86 and on recheck is 132/84.  I will plan follow-up in 1 month.  Today he denies  shortness of breath, lower extremity edema, fatigue, palpitations, melena, hematuria, hemoptysis, diaphoresis, weakness, presyncope, syncope, orthopnea, and PND.     Home Medications    Prior to Admission medications   Medication Sig Start Date End Date Taking? Authorizing Provider  aspirin 81 MG chewable tablet Chew 1 tablet (81 mg total) by mouth daily. 02/21/23   Arty Baumgartner, NP  atorvastatin (LIPITOR) 80 MG tablet Take 1 tablet (80  mg total) by mouth daily. 02/21/23   Arty Baumgartner, NP  empagliflozin (JARDIANCE) 10 MG TABS tablet Take 1 tablet (10 mg total) by mouth daily. 02/20/23   Croitoru, Mihai, MD  losartan (COZAAR) 25 MG tablet Take 0.5 tablets (12.5 mg total) by mouth daily. 02/21/23   Arty Baumgartner, NP  nicotine polacrilex (NICORETTE) 2 MG gum Take 1 each (2 mg total) by mouth as needed for smoking cessation. 04/01/22   Marcine Matar, MD  nitroGLYCERIN (NITROSTAT) 0.4 MG SL tablet Place 1 tablet (0.4 mg total) under the tongue every 5 (five) minutes as needed. 02/21/23   Arty Baumgartner, NP  prasugrel (EFFIENT) 10 MG TABS tablet Take 1 tablet (10 mg total) by mouth daily. 02/21/23   Arty Baumgartner, NP    Family History    Family History  Problem Relation Age of Onset   Heart attack Maternal Grandfather    Hypertension Maternal Grandfather    Heart attack Paternal Grandmother    He indicated that his mother is alive. He indicated that his father is deceased. He indicated that the status of his maternal grandfather is unknown. He indicated that the status of his paternal grandmother is unknown.  Social History    Social History   Socioeconomic History   Marital status: Single    Spouse name: Not on file   Number of children: 1   Years of education: Not on file   Highest education level: High school graduate  Occupational History   Occupation: [employer]  Tobacco Use   Smoking status: Former    Current packs/day: 0.50    Average packs/day: 0.5 packs/day for 8.0 years (4.0 ttl pk-yrs)    Types: Cigarettes    Start date: 06/27/2015   Smokeless tobacco: Never   Tobacco comments:    07/07/2023 patient smokes a pack of cigarettes daily  Vaping Use   Vaping status: Never Used  Substance and Sexual Activity   Alcohol use: No    Alcohol/week: 0.0 standard drinks of alcohol   Drug use: No   Sexual activity: Not on file  Other Topics Concern   Not on file  Social History Narrative    Not on file   Social Drivers of Health   Financial Resource Strain: Low Risk  (02/21/2023)   Overall Physicist, medical Strain (  CARDIA)    Difficulty of Paying Living Expenses: Not very hard  Food Insecurity: No Food Insecurity (02/21/2023)   Hunger Vital Sign    Worried About Running Out of Food in the Last Year: Never true    Ran Out of Food in the Last Year: Never true  Transportation Needs: No Transportation Needs (02/21/2023)   PRAPARE - Administrator, Civil Service (Medical): No    Lack of Transportation (Non-Medical): No  Physical Activity: Not on file  Stress: Not on file  Social Connections: Not on file  Intimate Partner Violence: Not At Risk (02/18/2023)   Humiliation, Afraid, Rape, and Kick questionnaire    Fear of Current or Ex-Partner: No    Emotionally Abused: No    Physically Abused: No    Sexually Abused: No     Review of Systems    General:  No chills, fever, night sweats or weight changes.  Cardiovascular:  No chest pain, dyspnea on exertion, edema, orthopnea, palpitations, paroxysmal nocturnal dyspnea. Dermatological: No rash, lesions/masses Respiratory: No cough, dyspnea Urologic: No hematuria, dysuria Abdominal:   No nausea, vomiting, diarrhea, bright red blood per rectum, melena, or hematemesis Neurologic:  No visual changes, wkns, changes in mental status. All other systems reviewed and are otherwise negative except as noted above.  Physical Exam    VS:  BP 132/84   Pulse 96   Ht 5\' 7"  (1.702 m)   Wt 151 lb 11.2 oz (68.8 kg)   SpO2 97%   BMI 23.76 kg/m  , BMI Body mass index is 23.76 kg/m. GEN: Well nourished, well developed, in no acute distress. HEENT: normal. Neck: Supple, no JVD, carotid bruits, or masses. Cardiac: RRR, no murmurs, rubs, or gallops. No clubbing, cyanosis, edema.  Radials/DP/PT 2+ and equal bilaterally.  Respiratory:  Respirations regular and unlabored, clear to auscultation bilaterally. GI: Soft, nontender,  nondistended, BS + x 4. MS: no deformity or atrophy. Skin: warm and dry, no rash.  Right radial cath site healed well no signs of infection. Neuro:  Strength and sensation are intact. Psych: Normal affect.  Accessory Clinical Findings    Recent Labs: 02/17/2023: ALT 179 02/18/2023: Magnesium 2.2; TSH 3.228 02/20/2023: Hemoglobin 13.9; Platelets 164 03/24/2023: B Natriuretic Peptide 44.2 05/12/2023: BUN 12; Creatinine, Ser 0.90; Potassium 4.9; Sodium 139   Recent Lipid Panel    Component Value Date/Time   CHOL 118 02/18/2023 0550   CHOL 180 04/01/2022 1133   TRIG 33 02/18/2023 0550   HDL 36 (L) 02/18/2023 0550   HDL 43 04/01/2022 1133   CHOLHDL 3.3 02/18/2023 0550   VLDL 7 02/18/2023 0550   LDLCALC 75 02/18/2023 0550   LDLCALC 114 (H) 04/01/2022 1133         ECG personally reviewed by me today-  none today.  Echocardiogram 02/18/2023 IMPRESSIONS     1. Left ventricular ejection fraction, by estimation, is 35 to 40%. The  left ventricle has moderately decreased function. The left ventricle  demonstrates global hypokinesis. There is mild left ventricular  hypertrophy. Left ventricular diastolic  parameters are indeterminate.   2. Right ventricular systolic function is moderately reduced. The right  ventricular size is normal. There is mildly elevated pulmonary artery  systolic pressure.   3. Right atrial size was moderately dilated.   4. Trivial mitral valve regurgitation.   5. Tricuspid valve regurgitation is moderate to severe.   6. The aortic valve is tricuspid. Aortic valve regurgitation is trivial.   FINDINGS   Left  Ventricle: Left ventricular ejection fraction, by estimation, is 35  to 40%. The left ventricle has moderately decreased function. The left  ventricle demonstrates global hypokinesis. The left ventricular internal  cavity size was normal in size.  There is mild left ventricular hypertrophy. Left ventricular diastolic  parameters are indeterminate.    Right Ventricle: The right ventricular size is normal. Right vetricular  wall thickness was not assessed. Right ventricular systolic function is  moderately reduced. There is mildly elevated pulmonary artery systolic  pressure. The tricuspid regurgitant  velocity is 2.66 m/s, and with an assumed right atrial pressure of 15  mmHg, the estimated right ventricular systolic pressure is 43.3 mmHg.   Left Atrium: Left atrial size was normal in size.   Right Atrium: Right atrial size was moderately dilated.   Pericardium: There is no evidence of pericardial effusion.   Mitral Valve: There is mild thickening of the mitral valve leaflet(s).  Trivial mitral valve regurgitation.   Tricuspid Valve: The tricuspid valve is normal in structure. Tricuspid  valve regurgitation is moderate to severe.   Aortic Valve: The aortic valve is tricuspid. Aortic valve regurgitation is  trivial. Aortic valve mean gradient measures 3.0 mmHg. Aortic valve peak  gradient measures 5.7 mmHg. Aortic valve area, by VTI measures 1.71 cm.   Pulmonic Valve: The pulmonic valve was grossly normal. Pulmonic valve  regurgitation is not visualized.   Aorta: The aortic root is normal in size and structure.   IAS/Shunts: No atrial level shunt detected by color flow Doppler.   Cardiac catheterization 02/18/2023  2 vessel obstructive CAD. 90% mid LCx. 100% proximal to mid RCA CTO with left to right collaterals. Moderate LV dysfunction. EF estimated at 40%. Global dysfunction Normal LV filling pressures. LVEDP 9 mm Hg. PCWP 19/18 with mean 15 mm Hg Normal PAP 44/12, mean 21 mm Hg Reduced cardiac output. 3.78 L/min index 2.17 Successful PCI of the mid LCx with DES   Plan: DAPT for one year. Anticipate DC tomorrow. Optimize medical therapy for CHF. RCA occlusion is not suited for CTO PCI- treat medically   Diagnostic Dominance: Right  Intervention        Assessment & Plan   1.  Acute on chronic systolic  CHF-he continues to be euvolemic.  Weight today 151 pounds.  BP today 132/84.  EF noted to be 30-35% on 02/18/2023.  Instructed patient to contact office with questions or concerns about medication. Continue Entresto, Jardiance, furosemide, spironolactone, metoprolol Take Entresto twice daily Heart healthy low-sodium diet Increase physical activity as tolerated Following with advanced heart failure team-follow-up as scheduled Plan for repeat echocardiogram once medications have been optimized x 1 month  CAD-denies chest discomfort.  Had cardiac catheterization 02/18/2023 with CTO of RCA and PCI with DES x 1 to his circumflex.  He was noted to have 35% proximal LAD stenosis and left to right collaterals.  Details above. Continue aspirin, Effient, atorvastatin, metoprolol succinate Heart healthy low-sodium diet Increase physical activity as tolerated  Essential hypertension-BP today 132/84 Maintain blood pressure log Continue metoprolol, spironolactone, Entresto  Tobacco abuse-using nicotine patches.   Continues to smoke  Hyperlipidemia-LDL 75 on 02/18/23. High-fiber diet Atorvastatin, aspirin Increase physical activity as tolerated Repeat fasting lipids and LFTs    Type 2 diabetes-glucose 181 on 03/24/23 Carb modified diet Continue Jardiance Follows with PCP  Left upper extremity pain, lower extremity pain-Notes pain started about 1.5 months ago.  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  Pain appears to be musculoskeletal versus nerve  type pain.  Notes tingling in his grooves when extending his left arm. Follow-up with PCP Rest affected area  Disposition: Follow-up with Dr. Jens Som or me in 1 months.   Thomasene Ripple. Destyn Schuyler NP-C     07/07/2023, 4:37 PM [facility] [address] Office [phone] Fax [phone]    I spent 14 minutes examining this patient, reviewing medications, and using patient centered shared decision making  involving his cardiac care.   I spent greater than 20 minutes reviewing his past medical history,  medications, and prior cardiac tests.

## 2023-07-07 ENCOUNTER — Ambulatory Visit: Payer: No Typology Code available for payment source | Attending: General Practice | Admitting: General Practice

## 2023-07-07 ENCOUNTER — Encounter: Payer: Self-pay | Admitting: General Practice

## 2023-07-07 VITALS — BP 132/84 | HR 96 | Ht 67.0 in | Wt 151.7 lb

## 2023-07-07 DIAGNOSIS — E782 Mixed hyperlipidemia: Secondary | ICD-10-CM

## 2023-07-07 DIAGNOSIS — I5022 Chronic systolic (congestive) heart failure: Secondary | ICD-10-CM | POA: Diagnosis not present

## 2023-07-07 DIAGNOSIS — I2510 Atherosclerotic heart disease of native coronary artery without angina pectoris: Secondary | ICD-10-CM

## 2023-07-07 DIAGNOSIS — F17200 Nicotine dependence, unspecified, uncomplicated: Secondary | ICD-10-CM | POA: Diagnosis not present

## 2023-07-07 MED ORDER — ATORVASTATIN CALCIUM 80 MG PO TABS: 80.0000 mg | ORAL_TABLET | Freq: Every day | ORAL | 1 refills | Status: AC

## 2023-07-07 MED ORDER — SPIRONOLACTONE 25 MG PO TABS: 25.0000 mg | ORAL_TABLET | Freq: Every day | ORAL | 1 refills | Status: AC

## 2023-07-07 MED ORDER — PRASUGREL HCL 10 MG PO TABS: 10.0000 mg | ORAL_TABLET | Freq: Every day | ORAL | 1 refills | Status: AC

## 2023-07-07 MED ORDER — SACUBITRIL-VALSARTAN 97-103 MG PO TABS: 1.0000 | ORAL_TABLET | Freq: Two times a day (BID) | ORAL | Status: AC

## 2023-07-07 NOTE — Patient Instructions (Signed)
 Medication Instructions:  MAKE SURE TO TAKE YOUR ENTRESTO 97-103MG  TWICE DAILY *If you need a refill on your cardiac medications before your next appointment, please call your pharmacy*  Lab Work: NONE  Testing/Procedures: NONE  Follow-Up: At [facility], you and your health needs are our priority.  As part of our continuing mission to provide you with exceptional heart care, our providers are all part of one team.  This team includes your primary Cardiologist (physician) and Advanced Practice Providers or APPs (Physician Assistants and Nurse Practitioners) who all work together to provide you with the care you need, when you need it.  Your next appointment:   1 month(s)  Provider:   Edd Fabian, FNP-C     Other Instructions CALL us IF YOU HAVE ANY MEDICATION ISSUES OR CHANGES GO TO URGENT CARE OR DISCUSS YOUR LEFT ARM AND LEG PAIN      1st Floor: - Lobby - Registration  - Pharmacy  - Lab - Cafe  2nd Floor: - PV Lab - Diagnostic Testing (echo, CT, nuclear med)  3rd Floor: - Vacant  4th Floor: - TCTS (cardiothoracic surgery) - AFib Clinic - Structural Heart Clinic - Vascular Surgery  - Vascular Ultrasound  5th Floor: - HeartCare Cardiology (general and EP) - Clinical Pharmacy for coumadin, hypertension, lipid, weight-loss medications, and med management appointments    Valet parking services will be available as well.

## 2023-07-08 ENCOUNTER — Other Ambulatory Visit: Payer: Self-pay | Admitting: Cardiology

## 2023-07-08 ENCOUNTER — Other Ambulatory Visit (HOSPITAL_COMMUNITY): Payer: Self-pay

## 2023-07-13 ENCOUNTER — Encounter (HOSPITAL_COMMUNITY): Payer: Self-pay | Admitting: Emergency Medicine

## 2023-07-13 ENCOUNTER — Emergency Department (HOSPITAL_COMMUNITY)
Admission: EM | Admit: 2023-07-13 | Discharge: 2023-07-13 | Discharge status: Against Medical Advice | Attending: Emergency Medicine | Admitting: Emergency Medicine

## 2023-07-13 ENCOUNTER — Other Ambulatory Visit: Payer: Self-pay

## 2023-07-13 DIAGNOSIS — R200 Anesthesia of skin: Secondary | ICD-10-CM | POA: Insufficient documentation

## 2023-07-13 DIAGNOSIS — Z5321 Procedure and treatment not carried out due to patient leaving prior to being seen by health care provider: Secondary | ICD-10-CM | POA: Insufficient documentation

## 2023-07-13 NOTE — ED Triage Notes (Signed)
 Pt complains of numbness on left side from shoulder to toes for the last 6 days. Pt has equal strength and sensation in both arms and legs on exam.  PT does have limited range of motion in left shoulder.

## 2023-07-15 ENCOUNTER — Emergency Department (HOSPITAL_COMMUNITY)
Admission: EM | Admit: 2023-07-15 | Discharge: 2023-07-16 | Disposition: A | Discharge status: Home / Self Care | Attending: Emergency Medicine | Admitting: Emergency Medicine

## 2023-07-15 ENCOUNTER — Encounter (HOSPITAL_COMMUNITY): Payer: Self-pay | Admitting: Emergency Medicine

## 2023-07-15 ENCOUNTER — Emergency Department (HOSPITAL_COMMUNITY)

## 2023-07-15 ENCOUNTER — Other Ambulatory Visit: Payer: Self-pay

## 2023-07-15 DIAGNOSIS — Z7982 Long term (current) use of aspirin: Secondary | ICD-10-CM | POA: Insufficient documentation

## 2023-07-15 DIAGNOSIS — R079 Chest pain, unspecified: Secondary | ICD-10-CM | POA: Insufficient documentation

## 2023-07-15 DIAGNOSIS — M25512 Pain in left shoulder: Secondary | ICD-10-CM | POA: Insufficient documentation

## 2023-07-15 NOTE — ED Triage Notes (Signed)
 Pt in POV with L sided chest and shoulder pain x 1 wk. Pt states 1 wk ago, he began to have L face, arm and leg numbness but now only remaining numbness is to L arm, limited ROM due to shoulder pain. Hx CHF and MI with stents

## 2023-07-16 ENCOUNTER — Emergency Department (HOSPITAL_COMMUNITY)

## 2023-07-16 LAB — CBC
HCT: 48.5 % (ref 39.0–52.0)
Hemoglobin: 16.5 g/dL (ref 13.0–17.0)
MCH: 30.4 pg (ref 26.0–34.0)
MCHC: 34 g/dL (ref 30.0–36.0)
MCV: 89.3 fL (ref 80.0–100.0)
Platelets: 231 10*3/uL (ref 150–400)
RBC: 5.43 MIL/uL (ref 4.22–5.81)
RDW: 12.1 % (ref 11.5–15.5)
WBC: 8.6 10*3/uL (ref 4.0–10.5)
nRBC: 0 % (ref 0.0–0.2)

## 2023-07-16 LAB — BASIC METABOLIC PANEL WITH GFR
Anion gap: 13 (ref 5–15)
BUN: 17 mg/dL (ref 6–20)
CO2: 22 mmol/L (ref 22–32)
Calcium: 9.7 mg/dL (ref 8.9–10.3)
Chloride: 101 mmol/L (ref 98–111)
Creatinine, Ser: 1.04 mg/dL (ref 0.61–1.24)
GFR, Estimated: >60 mL/min (ref >60)
Glucose, Bld: 154 mg/dL — ABNORMAL HIGH (ref 70–99)
Potassium: 3.9 mmol/L (ref 3.5–5.1)
Sodium: 136 mmol/L (ref 135–145)

## 2023-07-16 LAB — TROPONIN I (HIGH SENSITIVITY)
Troponin I (High Sensitivity): 16 ng/L (ref <18)
Troponin I (High Sensitivity): 17 ng/L (ref <18)

## 2023-07-16 NOTE — ED Provider Notes (Signed)
 [place] EMERGENCY DEPARTMENT AT [facility] Provider Note   CSN: [id number] Arrival date & time: 07/15/23  2257     History  Chief Complaint  Patient presents with   Chest Pain   Shoulder Pain    Jimmy Parker is a 53 y.o. male.  The history is provided by the patient.  Patient presents for shoulder pain.  On my evaluation he refused to answer any questions reports he told triage everything I need to know     Home Medications Prior to Admission medications   Medication Sig Start Date End Date Taking? Authorizing Provider  aspirin 81 MG chewable tablet Chew 1 tablet (81 mg total) by mouth daily. 02/21/23   Arty Baumgartner, NP  atorvastatin (LIPITOR) 80 MG tablet Take 1 tablet (80 mg total) by mouth daily. 07/07/23   Ronney Asters, NP  empagliflozin (JARDIANCE) 10 MG TABS tablet Take 1 tablet (10 mg total) by mouth daily. 02/20/23   Croitoru, Mihai, MD  furosemide (LASIX) 20 MG tablet Take 1 tablet (20 mg total) by mouth daily as needed (as needed for weight gain/swelling). 03/04/23   Robbie Lis M, PA-C  metoprolol succinate (TOPROL-XL) 25 MG 24 hr tablet Take 0.5 tablets (12.5 mg total) by mouth daily. Take with or immediately following a meal. 03/24/23   Sabharwal, Aditya, DO  nicotine (EQ NICOTINE) 14 mg/24hr patch APPLY 1 PATCH TOPICALLY ONCE DAILY FOR 2 WEEKS Patient not taking: Reported on 07/07/2023 06/02/23   Ronney Asters, NP  nicotine (NICODERM CQ) 21 mg/24hr patch Place 1 patch (21 mg total) onto the skin daily. for 2 weeks starting 04-22-23 Patient not taking: Reported on 07/07/2023 04/21/23   Ronney Asters, NP  nicotine (NICODERM CQ) 7 mg/24hr patch Place 1 patch (7 mg total) onto the skin daily. Use for 7 weeks then stop Patient not taking: Reported on 07/07/2023 04/21/23   Ronney Asters, NP  nicotine polacrilex (NICORETTE) 2 MG gum Take 1 each (2 mg total) by mouth as needed for smoking cessation. Patient not taking: Reported on 07/07/2023  04/01/22   Marcine Matar, MD  nitroGLYCERIN (NITROSTAT) 0.4 MG SL tablet Place 1 tablet (0.4 mg total) under the tongue every 5 (five) minutes as needed. Patient not taking: Reported on 07/07/2023 04/21/23   Ronney Asters, NP  prasugrel (EFFIENT) 10 MG TABS tablet Take 1 tablet (10 mg total) by mouth daily. 07/07/23   Ronney Asters, NP  sacubitril-valsartan (ENTRESTO) 97-103 MG Take 1 tablet by mouth 2 (two) times daily. 07/07/23   Ronney Asters, NP  spironolactone (ALDACTONE) 25 MG tablet Take 1 tablet (25 mg total) by mouth daily. 07/07/23   Ronney Asters, NP      Allergies    Patient has no known allergies.    Review of Systems   Review of Systems  Physical Exam Updated Vital Signs BP 137/74 (BP Location: Left Arm)   Pulse 77   Temp 98.1 F (36.7 C) (Oral)   Resp 16   Wt 68.8 kg   SpO2 97%   BMI 23.76 kg/m  Physical Exam CONSTITUTIONAL: Well developed/well nourished, no distress HEAD: Normocephalic/atraumatic ENMT: Mucous membranes moist NECK: supple no meningeal signs CV: S1/S2 noted, no murmurs/rubs/gallops noted LUNGS: Lungs are clear to auscultation bilaterally, no apparent distress NEURO: Pt is awake/alert/appropriate, moves all extremitiesx4.  No facial droop.  No arm or leg drift.  Difficulty with range of motion of left arm above his head EXTREMITIES:  pulses normal/equal, full ROM Tenderness noted to left anterior shoulder, no deformities, no crepitus Distal pulses are intact SKIN: warm, color normal  ED Results / Procedures / Treatments   Labs (all labs ordered are listed, but only abnormal results are displayed) Labs Reviewed  BASIC METABOLIC PANEL WITH GFR - Abnormal; Notable for the following components:      Result Value   Glucose, Bld 154 (*)    All other components within normal limits  CBC  TROPONIN I (HIGH SENSITIVITY)  TROPONIN I (HIGH SENSITIVITY)    EKG EKG Interpretation Date/Time:  Friday July 15 2023 23:24:36 EDT Ventricular  Rate:  89 PR Interval:  128 QRS Duration:  94 QT Interval:  356 QTC Calculation: 433 R Axis:   66  Text Interpretation: Normal sinus rhythm Minimal voltage criteria for LVH, may be normal variant ( Sokolow-Lyon ) No significant change since last tracing Interpretation limited secondary to artifact Confirmed by Zadie Rhine ([id number]) on 07/16/2023 5:29:27 AM  Radiology DG Shoulder Left Result Date: 07/16/2023 CLINICAL DATA:  Chest and left shoulder pain, initial encounter EXAM: LEFT SHOULDER - 2+ VIEW COMPARISON:  None Available. FINDINGS: There is no evidence of fracture or dislocation. There is no evidence of arthropathy or other focal bone abnormality. Soft tissues are unremarkable. IMPRESSION: No acute abnormality noted. Electronically Signed   By: Alcide Clever M.D.   On: 07/16/2023 00:47   DG Chest 2 View Result Date: 07/16/2023 CLINICAL DATA:  Chest pain EXAM: CHEST - 2 VIEW COMPARISON:  02/17/2023 FINDINGS: The heart size and mediastinal contours are within normal limits. Both lungs are clear. The visualized skeletal structures are unremarkable. IMPRESSION: No active cardiopulmonary disease. Electronically Signed   By: Deatra Robinson M.D.   On: 07/16/2023 00:47    Procedures Procedures    Medications Ordered in ED Medications - No data to display  ED Course/ Medical Decision Making/ A&P                                 Medical Decision Making Amount and/or Complexity of Data Reviewed Labs: ordered. Radiology: ordered.   On my evaluation patient refuses to answer any other questions and is ready for discharge.  Most of his issues now include left shoulder pain.  No recent trauma but he clearly has pain with movement and lifting his left arm.  This could be a rotator cuff issue.  I did inform him that his x-rays were negative  Patient had reported numbness in his leg last week but none at this time. At this time no signs of acute neurologic emergency.  Due to his shoulder pain,  he will be referred to orthopedics        Final Clinical Impression(s) / ED Diagnoses Final diagnoses:  Acute pain of left shoulder    Rx / DC Orders ED Discharge Orders     None         Zadie Rhine, MD 07/16/23 [phone]

## 2023-07-27 NOTE — Progress Notes (Signed)
 ADVANCED HEART FAILURE CLINIC NOTE  Referring Physician: No ref. provider found  Primary Care: Department, [facility]  CC: [id number]  HPI: Jimmy Parker is a 53 y.o. male with coronary artery disease (CTO of the RCA), heart failure with reduced EF, hypertension presenting today for follow up. His cardiac history dates back to atleast 2017 when LHC demonstrated CTO of the mid to distal RCA. At that time TTE w/ LVEF of 45-50%. He was admitted in 11/24 with NSTEMI with [facility] demonstrating 2 vessel obstructive CAD s/p PCI to the LAD. CI at that time of 2.2 L/min/m2. TTE w/ LVEF of 35%-40%.  He has since been seen in [facility] clinic where meds further uptitrated. Since that time, he has continued to feel tired and fatigued.  Reports that he wishes to go back to work. No longer having significant chest pain, lower extremity edema or shortness of breath.   Interval hx:  - Only complaints today are shoulder pain and diffuse myalgias.  - Reports that he becomes short of breath intermittently - compliant with all medications.  - Very frustrated with insurance problems   Current Outpatient Medications  Medication Sig Dispense Refill   aspirin 81 MG chewable tablet Chew 1 tablet (81 mg total) by mouth daily. 90 tablet 2   atorvastatin (LIPITOR) 80 MG tablet Take 1 tablet (80 mg total) by mouth daily. 90 tablet 1   empagliflozin (JARDIANCE) 10 MG TABS tablet Take 1 tablet (10 mg total) by mouth daily. 30 tablet 0   furosemide (LASIX) 20 MG tablet Take 1 tablet (20 mg total) by mouth daily as needed (as needed for weight gain/swelling). 30 tablet 3   metoprolol succinate (TOPROL-XL) 25 MG 24 hr tablet Take 0.5 tablets (12.5 mg total) by mouth daily. Take with or immediately following a meal. 45 tablet 3   nicotine (EQ NICOTINE) 14 mg/24hr patch APPLY 1 PATCH TOPICALLY ONCE DAILY FOR 2 WEEKS (Patient not taking: Reported on 07/07/2023) 30 patch 0   nicotine (NICODERM CQ) 21 mg/24hr patch Place 1  patch (21 mg total) onto the skin daily. for 2 weeks starting 04-22-23 (Patient not taking: Reported on 07/07/2023) 14 patch 0   nicotine (NICODERM CQ) 7 mg/24hr patch Place 1 patch (7 mg total) onto the skin daily. Use for 7 weeks then stop (Patient not taking: Reported on 07/07/2023) 49 patch 0   nicotine polacrilex (NICORETTE) 2 MG gum Take 1 each (2 mg total) by mouth as needed for smoking cessation. (Patient not taking: Reported on 07/07/2023) 100 tablet 3   nitroGLYCERIN (NITROSTAT) 0.4 MG SL tablet Place 1 tablet (0.4 mg total) under the tongue every 5 (five) minutes as needed. (Patient not taking: Reported on 07/07/2023) 25 tablet 2   prasugrel (EFFIENT) 10 MG TABS tablet Take 1 tablet (10 mg total) by mouth daily. 90 tablet 1   sacubitril-valsartan (ENTRESTO) 97-103 MG Take 1 tablet by mouth 2 (two) times daily. 180 tablet 01   spironolactone (ALDACTONE) 25 MG tablet Take 1 tablet (25 mg total) by mouth daily. 90 tablet 1   No current facility-administered medications for this visit.      PHYSICAL EXAM: N/A   DATA REVIEW  ECG: 03/24/23: sinus tachycardia with LVH  as per my personal interpretation  ECHO: 02/18/23: LVEF 35%-40%, moderately reduced RV function 07/08/15: LVEF 45%-50%, inferior hypokinesis  CATH: LHC 02/18/23:  2 vessel obstructive CAD. 90% mid LCx. 100% proximal to mid RCA CTO with left to right collaterals. Moderate LV  dysfunction. EF estimated at 40%. Global dysfunction Normal LV filling pressures. LVEDP 9 mm Hg. PCWP 19/18 with mean 15 mm Hg Normal PAP 44/12, mean 21 mm Hg Reduced cardiac output. 3.78 L/min index 2.17 Successful PCI of the mid LCx with DES  LHC 2017:  Prox RCA lesion, 20% stenosed. Mid RCA lesion, 100% stenosed. Mid Cx lesion, 20% stenosed. Prox LAD to Mid LAD lesion, 20% stenosed. There is moderate left ventricular systolic dysfunction.   ASSESSMENT & PLAN:  Heart failure with reduced EF Etiology of QM:VHQIONGE cardiomyopathy dating  back to CTO of the RCA in 2017 NYHA class / AHA Stage:II Volume status & Diuretics: lasix 20mg  PRN Vasodilators:entresto 49/51mg  BID; increase to 97/103mg  BID Beta-Blocker:Increase toprol to 25mg  daily.  MRA:spironolactone 25mg  daily Cardiometabolic:jardiance 10mg  daily Devices therapies & Valvulopathies:not currently indicated Advanced therapies:not currently indicated  2. CAD - LHC as above with multivessel CAD including CTO of the mid RCA - S/P PCI to the Lcx with an index fo 2.2 L/min/m2 - Lipitor 80mg  daily - ASA 81mg  daily  3. HTN - entresto 49/51mg  BID - repeat BMP/BNP today  4. Hyperlipidemia - LDL 75 on 02/18/23  During our discussion today, Mr. Batalla continued to remain frustrated over insurance problems. We have gone above and beyond to help him with insurance problems by contacting social work, calling his insurance, etc. In addition, I have previously explained to him in detail how the heart functions and the effects of medications on the heart. I have also reviewed his echocardiograms with him. Today Mr. Shugart kept stating "Am I just here to see your face?". "What are you even doing" "Do you know what you are doing." In addition, he has been verbally abusive with our staff in the past with use of explicit language over the phone. Patient said he does not want to be seen here anymore. I offered to refer him to another cardiologist. He denied. We will dismiss him from AHF clinic for abusive interactions with our staff and inability to maintain proper etiquette.    Amesha Bailey Advanced Heart Failure Mechanical Circulatory Support

## 2023-07-28 ENCOUNTER — Ambulatory Visit (HOSPITAL_COMMUNITY)
Admission: RE | Admit: 2023-07-28 | Discharge: 2023-07-28 | Disposition: A | Discharge status: Home / Self Care | Source: Ambulatory Visit | Attending: Cardiology | Admitting: Cardiology

## 2023-07-28 ENCOUNTER — Encounter (HOSPITAL_COMMUNITY): Payer: Self-pay | Admitting: Cardiology

## 2023-07-28 VITALS — BP 144/82 | HR 102 | Ht 67.0 in | Wt 147.8 lb

## 2023-07-28 DIAGNOSIS — M791 Myalgia, unspecified site: Secondary | ICD-10-CM | POA: Diagnosis not present

## 2023-07-28 DIAGNOSIS — Z7984 Long term (current) use of oral hypoglycemic drugs: Secondary | ICD-10-CM | POA: Insufficient documentation

## 2023-07-28 DIAGNOSIS — I255 Ischemic cardiomyopathy: Secondary | ICD-10-CM | POA: Diagnosis not present

## 2023-07-28 DIAGNOSIS — M25519 Pain in unspecified shoulder: Secondary | ICD-10-CM | POA: Diagnosis not present

## 2023-07-28 DIAGNOSIS — I5022 Chronic systolic (congestive) heart failure: Secondary | ICD-10-CM | POA: Diagnosis not present

## 2023-07-28 DIAGNOSIS — I252 Old myocardial infarction: Secondary | ICD-10-CM | POA: Diagnosis not present

## 2023-07-28 DIAGNOSIS — E785 Hyperlipidemia, unspecified: Secondary | ICD-10-CM | POA: Diagnosis not present

## 2023-07-28 DIAGNOSIS — I2510 Atherosclerotic heart disease of native coronary artery without angina pectoris: Secondary | ICD-10-CM | POA: Insufficient documentation

## 2023-07-28 DIAGNOSIS — R5383 Other fatigue: Secondary | ICD-10-CM | POA: Insufficient documentation

## 2023-07-28 DIAGNOSIS — Z955 Presence of coronary angioplasty implant and graft: Secondary | ICD-10-CM | POA: Insufficient documentation

## 2023-07-28 DIAGNOSIS — I110 Hypertensive heart disease with heart failure: Secondary | ICD-10-CM | POA: Diagnosis not present

## 2023-07-28 DIAGNOSIS — Z79899 Other long term (current) drug therapy: Secondary | ICD-10-CM | POA: Insufficient documentation

## 2023-07-28 DIAGNOSIS — Z7982 Long term (current) use of aspirin: Secondary | ICD-10-CM | POA: Diagnosis not present

## 2023-07-28 NOTE — Patient Instructions (Signed)
 Jimmy Parker

## 2023-07-28 NOTE — Progress Notes (Signed)
 Patient very frustrated and voiced issues with insurance and medication since the rooming process began at beginning of appointment. Patient did not wish to finish appointment with Dr. Bruce Caper and refused referral to another cardiologist. Patient left clinic.

## 2023-08-24 NOTE — Progress Notes (Deleted)
 Cardiology Clinic Note   Patient Name: Jimmy Parker Date of Encounter: 08/24/2023  Primary Care Provider:  Department, [facility] Primary Cardiologist:  Alexandria Angel, MD  Patient Profile    Jimmy Parker 53 year old male presents the clinic today for follow-up evaluation of his coronary artery disease and essential hypertension.  Past Medical History    Past Medical History:  Diagnosis Date   CAD (coronary artery disease)    a. 07/2013 Neg MV;  b. 06/2015 NSTEMI/Cath:LM nl, LAD 73m,  D1/2/3 nl, RI mod/nl, LCX 38m, OM1 small, OM2 nl, RCA 20p, 16m, RPDA fills via collats from OM3, EF 35-45%.   CAD- occluded RCA with collaterals 06/24/2015   Chest pain 08/03/2013   CHF (congestive heart failure) (HCC)    Diabetes mellitus (HCC) 05/28/2008   Qualifier: Diagnosis of  By: Ladene Pickle     Diet-controlled diabetes mellitus (HCC)    Dyslipidemia 06/23/2015   DYSMETABOLIC SYNDROME 08/13/2008   Qualifier: Diagnosis of  By: Gaylyn Keas FNP, [place]     Heart attack [facility])    Hyperlipidemia    Hypertension    Hypertensive heart disease    Ischemic cardiomyopathy    a. 07/2013 Echo: EF 50-55%, no rwma, Gr1 DD;  b. 06/2015 EF 35-45% by Vgram.    NSTEMI (non-ST elevated myocardial infarction) (HCC) 06/23/2015   Tobacco abuse    a. quit 06/2015.   TOBACCO ABUSE 08/05/2009   Qualifier: Diagnosis of  By: Ladene Pickle     Past Surgical History:  Procedure Laterality Date   CARDIAC CATHETERIZATION N/A 06/23/2015   Procedure: Left Heart Cath and Coronary Angiography;  Surgeon: Odie Benne, MD;  Location: [facility] INVASIVE CV LAB;  Service: Cardiovascular;  Laterality: N/A;   CORONARY STENT INTERVENTION N/A 02/18/2023   Procedure: CORONARY STENT INTERVENTION;  Surgeon: Swaziland, Peter M, MD;  Location: [facility] INVASIVE CV LAB;  Service: Cardiovascular;  Laterality: N/A;   FRACTURE SURGERY     femur and hip   RIGHT/LEFT HEART CATH AND CORONARY ANGIOGRAPHY N/A 02/18/2023   Procedure:  RIGHT/LEFT HEART CATH AND CORONARY ANGIOGRAPHY;  Surgeon: Swaziland, Peter M, MD;  Location: [facility] INVASIVE CV LAB;  Service: Cardiovascular;  Laterality: N/A;   SHOULDER SURGERY     Right: Patient denies any hx of shoulder surgery    Allergies  No Known Allergies  History of Present Illness    Jimmy Parker has a PMH of diabetes, NSTEMI, ischemic cardiomyopathy, tobacco abuse, acute on chronic systolic CHF, elevated liver enzymes, and upper abdominal pain.  He had NSTEMI 2017.  He was admitted to the hospital on 02/18/2023 and discharged on 02/21/2023.  Cardiology was consulted on 02/18/2023 for evaluation of epigastric pain.  He reported that he was feeling well until 02/14/2023.  He works in Production designer, theatre/television/film.  The Monday prior to presenting to the hospital he noted stomach discomfort and associated fatigue.  He felt his symptoms moved to his lower chest at times.  He developed shortness of breath and orthopnea.  He missed work for 48 hours.  He attempted to go back to work but noted persistent symptoms.  He had persistent nausea but no vomiting.  He was spitting up saliva.  He felt chills but no fevers.  He did note loose stools.  He denied history of cholecystectomy or appendectomy.  He reported that his previous MI symptoms in 2017 were similar which prompted him to go to the emergency department.  His temperature was noted to be 99.9.  His blood pressure was 110-140 over 70s and 80s.  His BMP was unremarkable.  His LFTs showed an AST of 93 and ALT of 179.  His CBC was unremarkable.  His EKG showed junctional bradycardia with T wave inversion in V4 through V6 with T wave flattening in 1 and aVL.  His high-sensitivity troponins were noted to be 2740.  He underwent cardiac catheterization on 02/18/2023 which showed a complete occlusion of mid RCA with left-to-right collaterals, mild nonobstructive disease in his LAD and circumflex.  He received PCI with DES to his mid circumflex.  He was placed on dual antiplatelet  therapy (aspirin , Brilinta ).  His echocardiogram at that time showed an EF of 35-40%, moderate reduced RV function, moderately dilated right atrium, and moderate-severe TR.  His GDMT was unable to be titrated due to bradycardia.  He was continued on Jardiance , losartan  12.5 mg daily, with plans to transition to Entresto  as an outpatient if able.  He presented to the clinic 02/28/23 for follow-up evaluation and stated he felt he had better blood pressure before starting medications.  We reviewed his cardiac catheterization and heart failure.  We also reviewed his cardiac catheterization from March 2017.  He reported that he felt Lied to".  About his previous cardiac catheterization.  I reviewed GDMT titration.  He did report an episode of chest discomfort earlier that morning with walking.  It dissipated without intervention as he continued to walk.   I filled out his FMLA paperwork.  We reviewed his upcoming visit with heart failure.  I increased his losartan  to 25 mg daily, planned repeat BMP in 1 week and repeat fasting lipids and LFTs in 6 to 8 weeks.  I planned follow-up in 1-2 months  He continue to follow-up with the advanced heart failure team.  He was seen 03/24/2023 by Dr. Bruce Caper.  He reported that with up titration of cardiac medications he felt tired and fatigued.  He indicated that he wished he could go back to work.  He was no longer having significant chest pain, lower extremity swelling or shortness of breath.  He was taking furosemide  20 mg as needed, Entresto  49/51 twice daily, metoprolol  succinate 12.5 mg daily, spironolactone  25 mg daily, Jardiance  10 mg daily.  Return to work restrictions were discussed.  Follow-up was planned for 06/02/2023.  He presented to the clinic 04/21/23 for follow-up evaluation and stated he had been having lower extremity pain and back discomfort.  He also noted intermittent episodes of chest discomfort which did not appear to be related to cardiac issues.  We  reviewed his previous cardiac visit and his advanced heart failure visit.  He expressed understanding.  His blood pressure was 142/82 and on recheck was noted to be 138/78.  We discussed GDMT up titration.  I  increased his Entresto  to 97/ 103 twice daily, order a BMP in 1 to 2 weeks, refilled his nitroglycerin  and planned follow-up in around 2 months.  His follow-up BMP showed stable renal function and electrolytes.  He presented to the clinic 07/07/23 for follow-up evaluation and stated he was having pain in his left shoulder back and down his left arm.  He was also noticing pain in his left leg.  He reported that the pain had been present for about a week and a half.  He noted that when he extended his arm he had tingling in his fingers on his left hand.  He noted that he had only been taking his Entresto  once per  day.  We reviewed his medication list and he also reported that he had not been taking Effient .  We reviewed his hospitalization and the importance of medication compliance.  He expressed understanding.  I explained that his pain in his left arm and leg appeared to be related to nerve type pain.  He asked about the possibility of having a blood clot.  We reviewed that he had no difference in the size of his lower extremities, no changes in his pulses and no inflammation present.  I refilled his a atorvastatin , Effient , Entresto , and spironolactone . Initially his blood pressure was 150/86 and on recheck it was 132/84.  I  planned follow-up in 1 month.  He was seen and evaluated in the emergency department for left shoulder pain.  His chest x-ray was normal.  He describes numbness in his left arm.  He noted limited range of motion due to pain in the shoulder.  His EKG showed normal sinus rhythm.  It was felt that his discomfort could be related to his rotator cuff.  He was referred to orthopedics.  He was seen in follow-up by Dr. Bruce Caper on 07/28/2023.  He reported that he was no longer having  significant chest pain or lower extremity swelling.  He denied shortness of breath.  He complained of shoulder pain and diffuse myalgias.  He did note bouts of intermittent shortness of breath.  He was compliant with his medications.  He was frustrated with insurance problems.  He reported he wished to go back to work.  His insurance difficulties for reviewed.  He stated "Am I just here to see your face?).  "Why are you even doing" "do you know what you are doing."  He was noted to be verbally abusive to staff in the past with use of explicit language over the phone.  He reported that he did not want to be seen there anymore.  He was offered to be seen by different cardiologist.  He denied.  He was dismissed from HF clinic for abusive interaction with staff and inability to maintain proper etiquette.    He presents to the clinic today for follow-up evaluation and states***.  Today he denies  shortness of breath, lower extremity edema, fatigue, palpitations, melena, hematuria, hemoptysis, diaphoresis, weakness, presyncope, syncope, orthopnea, and PND.     Home Medications    Prior to Admission medications   Medication Sig Start Date End Date Taking? Authorizing Provider  aspirin  81 MG chewable tablet Chew 1 tablet (81 mg total) by mouth daily. 02/21/23   Sanjuanita Cruz, NP  atorvastatin  (LIPITOR ) 80 MG tablet Take 1 tablet (80 mg total) by mouth daily. 02/21/23   Sanjuanita Cruz, NP  empagliflozin  (JARDIANCE ) 10 MG TABS tablet Take 1 tablet (10 mg total) by mouth daily. 02/20/23   Croitoru, Mihai, MD  losartan  (COZAAR ) 25 MG tablet Take 0.5 tablets (12.5 mg total) by mouth daily. 02/21/23   Sanjuanita Cruz, NP  nicotine  polacrilex (NICORETTE ) 2 MG gum Take 1 each (2 mg total) by mouth as needed for smoking cessation. 04/01/22   Lawrance Presume, MD  nitroGLYCERIN  (NITROSTAT ) 0.4 MG SL tablet Place 1 tablet (0.4 mg total) under the tongue every 5 (five) minutes as needed. 02/21/23    Sanjuanita Cruz, NP  prasugrel  (EFFIENT ) 10 MG TABS tablet Take 1 tablet (10 mg total) by mouth daily. 02/21/23   Sanjuanita Cruz, NP    Family History    Family History  Problem Relation  Age of Onset   Heart attack Maternal Grandfather    Hypertension Maternal Grandfather    Heart attack Paternal Grandmother    He indicated that his mother is alive. He indicated that his father is deceased. He indicated that the status of his maternal grandfather is unknown. He indicated that the status of his paternal grandmother is unknown.  Social History    Social History   Socioeconomic History   Marital status: Single    Spouse name: Not on file   Number of children: 1   Years of education: Not on file   Highest education level: High school graduate  Occupational History   Occupation: [employer]  Tobacco Use   Smoking status: Former    Current packs/day: 0.50    Average packs/day: 0.5 packs/day for 8.2 years (4.1 ttl pk-yrs)    Types: Cigarettes    Start date: 06/27/2015   Smokeless tobacco: Never   Tobacco comments:    07/07/2023 patient smokes a pack of cigarettes daily  Vaping Use   Vaping status: Never Used  Substance and Sexual Activity   Alcohol use: No    Alcohol/week: 0.0 standard drinks of alcohol   Drug use: No   Sexual activity: Not on file  Other Topics Concern   Not on file  Social History Narrative   Not on file   Social Drivers of Health   Financial Resource Strain: Low Risk  (08/18/2023)   Received from [facility]   Overall Financial Resource Strain (CARDIA)    Difficulty of Paying Living Expenses: Not hard at all  Food Insecurity: No Food Insecurity (08/18/2023)   Received from [facility]   Hunger Vital Sign    Worried About Running Out of Food in the Last Year: Never true    Ran Out of Food in the Last Year: Never true  Transportation Needs: No Transportation Needs (08/18/2023)   Received from [facility] - Transportation    Lack of  Transportation (Medical): No    Lack of Transportation (Non-Medical): No  Physical Activity: Not on file  Stress: Not on file  Social Connections: Not on file  Intimate Partner Violence: Not At Risk (02/18/2023)   Humiliation, Afraid, Rape, and Kick questionnaire    Fear of Current or Ex-Partner: No    Emotionally Abused: No    Physically Abused: No    Sexually Abused: No     Review of Systems    General:  No chills, fever, night sweats or weight changes.  Cardiovascular:  No chest pain, dyspnea on exertion, edema, orthopnea, palpitations, paroxysmal nocturnal dyspnea. Dermatological: No rash, lesions/masses Respiratory: No cough, dyspnea Urologic: No hematuria, dysuria Abdominal:   No nausea, vomiting, diarrhea, bright red blood per rectum, melena, or hematemesis Neurologic:  No visual changes, wkns, changes in mental status. All other systems reviewed and are otherwise negative except as noted above.  Physical Exam    VS:  There were no vitals taken for this visit. , BMI There is no height or weight on file to calculate BMI. GEN: Well nourished, well developed, in no acute distress. HEENT: normal. Neck: Supple, no JVD, carotid bruits, or masses. Cardiac: RRR, no murmurs, rubs, or gallops. No clubbing, cyanosis, edema.  Radials/DP/PT 2+ and equal bilaterally.  Respiratory:  Respirations regular and unlabored, clear to auscultation bilaterally. GI: Soft, nontender, nondistended, BS + x 4. MS: no deformity or atrophy. Skin: warm and dry, no rash.  Right radial cath site healed well no signs  of infection. Neuro:  Strength and sensation are intact. Psych: Normal affect.  Accessory Clinical Findings    Recent Labs: 02/17/2023: ALT 179 02/18/2023: Magnesium 2.2; TSH 3.228 03/24/2023: B Natriuretic Peptide 44.2 07/15/2023: BUN 17; Creatinine, Ser 1.04; Hemoglobin 16.5; Platelets 231; Potassium 3.9; Sodium 136   Recent Lipid Panel    Component Value Date/Time   CHOL 118  02/18/2023 0550   CHOL 180 04/01/2022 1133   TRIG 33 02/18/2023 0550   HDL 36 (L) 02/18/2023 0550   HDL 43 04/01/2022 1133   CHOLHDL 3.3 02/18/2023 0550   VLDL 7 02/18/2023 0550   LDLCALC 75 02/18/2023 0550   LDLCALC 114 (H) 04/01/2022 1133    No BP recorded.  {Refresh Note OR Click here to enter BP  :1}***    ECG personally reviewed by me today-  none today.  Echocardiogram 02/18/2023 IMPRESSIONS     1. Left ventricular ejection fraction, by estimation, is 35 to 40%. The  left ventricle has moderately decreased function. The left ventricle  demonstrates global hypokinesis. There is mild left ventricular  hypertrophy. Left ventricular diastolic  parameters are indeterminate.   2. Right ventricular systolic function is moderately reduced. The right  ventricular size is normal. There is mildly elevated pulmonary artery  systolic pressure.   3. Right atrial size was moderately dilated.   4. Trivial mitral valve regurgitation.   5. Tricuspid valve regurgitation is moderate to severe.   6. The aortic valve is tricuspid. Aortic valve regurgitation is trivial.   FINDINGS   Left Ventricle: Left ventricular ejection fraction, by estimation, is 35  to 40%. The left ventricle has moderately decreased function. The left  ventricle demonstrates global hypokinesis. The left ventricular internal  cavity size was normal in size.  There is mild left ventricular hypertrophy. Left ventricular diastolic  parameters are indeterminate.   Right Ventricle: The right ventricular size is normal. Right vetricular  wall thickness was not assessed. Right ventricular systolic function is  moderately reduced. There is mildly elevated pulmonary artery systolic  pressure. The tricuspid regurgitant  velocity is 2.66 m/s, and with an assumed right atrial pressure of 15  mmHg, the estimated right ventricular systolic pressure is 43.3 mmHg.   Left Atrium: Left atrial size was normal in size.   Right  Atrium: Right atrial size was moderately dilated.   Pericardium: There is no evidence of pericardial effusion.   Mitral Valve: There is mild thickening of the mitral valve leaflet(s).  Trivial mitral valve regurgitation.   Tricuspid Valve: The tricuspid valve is normal in structure. Tricuspid  valve regurgitation is moderate to severe.   Aortic Valve: The aortic valve is tricuspid. Aortic valve regurgitation is  trivial. Aortic valve mean gradient measures 3.0 mmHg. Aortic valve peak  gradient measures 5.7 mmHg. Aortic valve area, by VTI measures 1.71 cm.   Pulmonic Valve: The pulmonic valve was grossly normal. Pulmonic valve  regurgitation is not visualized.   Aorta: The aortic root is normal in size and structure.   IAS/Shunts: No atrial level shunt detected by color flow Doppler.   Cardiac catheterization 02/18/2023  2 vessel obstructive CAD. 90% mid LCx. 100% proximal to mid RCA CTO with left to right collaterals. Moderate LV dysfunction. EF estimated at 40%. Global dysfunction Normal LV filling pressures. LVEDP 9 mm Hg. PCWP 19/18 with mean 15 mm Hg Normal PAP 44/12, mean 21 mm Hg Reduced cardiac output. 3.78 L/min index 2.17 Successful PCI of the mid LCx with DES   Plan: DAPT  for one year. Anticipate DC tomorrow. Optimize medical therapy for CHF. RCA occlusion is not suited for CTO PCI- treat medically   Diagnostic Dominance: Right  Intervention        Assessment & Plan   1.  Acute on chronic systolic CHF-he continues to be euvolemic.  Weight today 1***51 pounds.  BP today 132***/84.  EF noted to be 30-35% on 02/18/2023.  Reports medication compliance. Continue Entresto , Jardiance , furosemide , spironolactone , metoprolol  Heart healthy low-sodium diet-reviewed Increase physical activity as tolerated-reviewed Repeat echocardiogram  Ordered BMP  Hyperlipidemia-LDL 75 on 02/18/23. High-fiber diet Atorvastatin , aspirin  Increase physical activity as  tolerated Repeat fasting lipids and LFTs   CAD-denies exertional type chest discomfort.  Had cardiac catheterization 02/18/2023 with CTO of RCA and PCI with DES x 1 to his circumflex.  He was noted to have 35% proximal LAD stenosis and left to right collaterals.  Details above. Continue aspirin , Effient , atorvastatin , metoprolol  succinate Heart healthy low-sodium diet   Essential hypertension-BP today 132***/84 Maintain blood pressure log Continue metoprolol , spironolactone , Entresto  Low-sodium diet  Tobacco abuse-using nicotine  patches.  Continues to cut back on smoking. Continues to smoke   Type 2 diabetes-glucose 154 on 07/15/23 Carb modified diet Continue Jardiance  Follows with PCP  Left upper extremity pain, -Notes pain started about 2.5 months ago.  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  Pain appears to be musculoskeletal versus nerve type pain.  Continues to report tingling  when extending his left arm. Has been referred to orthopedics Rest affected area May use ice and compression  Disposition: Follow-up with Dr. Audery Blazing or me after echocardiogram  Chet Cota. Sady Monaco NP-C     08/24/2023, 7:23 AM [facility] [address] Office [phone] Fax [phone]    I spent 14*** minutes examining this patient, reviewing medications, and using patient centered shared decision making involving his cardiac care.   I spent greater than 20 minutes reviewing his past medical history,  medications, and prior cardiac tests.

## 2023-08-25 ENCOUNTER — Ambulatory Visit: Attending: General Practice | Admitting: General Practice

## 2023-08-26 ENCOUNTER — Encounter: Payer: Self-pay | Admitting: General Practice

## 2023-10-31 ENCOUNTER — Telehealth: Payer: Self-pay

## 2023-10-31 ENCOUNTER — Other Ambulatory Visit (HOSPITAL_COMMUNITY): Payer: Self-pay

## 2023-10-31 NOTE — Telephone Encounter (Signed)
 Pharmacy Patient Advocate Encounter   Received notification from CoverMyMeds that prior authorization for ENTRESTO  is required/requested.   Insurance verification completed.   The patient is insured through [employer] .   Per test claim: PA required; PA submitted to above mentioned insurance via CoverMyMeds Key/confirmation #/EOC [id number] Status is pending

## 2023-10-31 NOTE — Telephone Encounter (Signed)
 Pharmacy Patient Advocate Encounter  Received notification from [employer] that Prior Authorization for ENTRESTO  has been APPROVED from 10/31/23 to 10/30/24

## 2024-03-04 ENCOUNTER — Other Ambulatory Visit (HOSPITAL_COMMUNITY): Payer: Self-pay | Admitting: Cardiology

## 2024-04-07 ENCOUNTER — Emergency Department (HOSPITAL_COMMUNITY): Payer: Self-pay

## 2024-04-07 ENCOUNTER — Emergency Department (HOSPITAL_COMMUNITY)
Admission: EM | Admit: 2024-04-07 | Discharge: 2024-04-08 | Payer: Self-pay | Attending: Emergency Medicine | Admitting: Emergency Medicine

## 2024-04-07 ENCOUNTER — Other Ambulatory Visit: Payer: Self-pay

## 2024-04-07 ENCOUNTER — Encounter (HOSPITAL_COMMUNITY): Payer: Self-pay

## 2024-04-07 DIAGNOSIS — R0789 Other chest pain: Secondary | ICD-10-CM | POA: Insufficient documentation

## 2024-04-07 DIAGNOSIS — R059 Cough, unspecified: Secondary | ICD-10-CM | POA: Insufficient documentation

## 2024-04-07 DIAGNOSIS — R202 Paresthesia of skin: Secondary | ICD-10-CM | POA: Insufficient documentation

## 2024-04-07 DIAGNOSIS — R42 Dizziness and giddiness: Secondary | ICD-10-CM | POA: Insufficient documentation

## 2024-04-07 DIAGNOSIS — R197 Diarrhea, unspecified: Secondary | ICD-10-CM | POA: Insufficient documentation

## 2024-04-07 DIAGNOSIS — R509 Fever, unspecified: Secondary | ICD-10-CM | POA: Insufficient documentation

## 2024-04-07 DIAGNOSIS — Z5321 Procedure and treatment not carried out due to patient leaving prior to being seen by health care provider: Secondary | ICD-10-CM | POA: Insufficient documentation

## 2024-04-07 LAB — URINALYSIS, ROUTINE W REFLEX MICROSCOPIC
Bacteria, UA: NONE SEEN
Bilirubin Urine: NEGATIVE
Glucose, UA: 50 mg/dL — AB
Hgb urine dipstick: NEGATIVE
Ketones, ur: 5 mg/dL — AB
Leukocytes,Ua: NEGATIVE
Nitrite: NEGATIVE
Protein, ur: NEGATIVE mg/dL
Specific Gravity, Urine: 1.02 (ref 1.005–1.030)
pH: 6 (ref 5.0–8.0)

## 2024-04-07 LAB — CBC
HCT: 49.6 % (ref 39.0–52.0)
Hemoglobin: 16.3 g/dL (ref 13.0–17.0)
MCH: 29.9 pg (ref 26.0–34.0)
MCHC: 32.9 g/dL (ref 30.0–36.0)
MCV: 90.8 fL (ref 80.0–100.0)
Platelets: 233 K/uL (ref 150–400)
RBC: 5.46 MIL/uL (ref 4.22–5.81)
RDW: 12.1 % (ref 11.5–15.5)
WBC: 7.9 K/uL (ref 4.0–10.5)
nRBC: 0 % (ref 0.0–0.2)

## 2024-04-07 LAB — BASIC METABOLIC PANEL WITH GFR
Anion gap: 10 (ref 5–15)
BUN: 12 mg/dL (ref 6–20)
CO2: 25 mmol/L (ref 22–32)
Calcium: 9.6 mg/dL (ref 8.9–10.3)
Chloride: 102 mmol/L (ref 98–111)
Creatinine, Ser: 1.05 mg/dL (ref 0.61–1.24)
GFR, Estimated: >60 mL/min
Glucose, Bld: 153 mg/dL — ABNORMAL HIGH (ref 70–99)
Potassium: 3.9 mmol/L (ref 3.5–5.1)
Sodium: 137 mmol/L (ref 135–145)

## 2024-04-07 LAB — RESP PANEL BY RT-PCR (RSV, FLU A&B, COVID)  RVPGX2
Influenza A by PCR: NEGATIVE
Influenza B by PCR: NEGATIVE
Resp Syncytial Virus by PCR: NEGATIVE
SARS Coronavirus 2 by RT PCR: NEGATIVE

## 2024-04-07 LAB — LIPASE, BLOOD: Lipase: 50 U/L (ref 11–51)

## 2024-04-07 LAB — TROPONIN T, HIGH SENSITIVITY
Troponin T High Sensitivity: <15 ng/L (ref 0–19)
Troponin T High Sensitivity: 15 ng/L (ref 0–19)

## 2024-04-07 NOTE — ED Notes (Signed)
 Pt went to his vehicle unknown when he will return.

## 2024-04-07 NOTE — ED Notes (Signed)
 Patient refused vitals.

## 2024-04-07 NOTE — ED Provider Triage Note (Signed)
 Emergency Medicine Provider Triage Evaluation Note  Jimmy Parker , a 53 y.o. male  was evaluated in triage.  Pt complains of multiple complaints.  Reports 3 days of subjective fevers, chest tightness, cough, tingling in tips of fingers.  Describes anterior chest tightness that radiates to the upper abdomen, no nausea or vomiting.  Reports known sick contacts of tested positive for COVID.  Notes episodes of dizziness that occur after prolonged episode of coughing.  Review of Systems  Positive: As above Negative: As above  Physical Exam  BP (!) 160/83 (BP Location: Right Arm)   Pulse 93   Temp 98.1 F (36.7 C)   Resp 17   SpO2 99%  Gen:   Awake, no distress   Resp:  Normal effort  MSK:   Moves extremities without difficulty  Other:    Medical Decision Making  Medically screening exam initiated at 3:54 PM.  Appropriate orders placed.  Jimmy Parker was informed that the remainder of the evaluation will be completed by another provider, this initial triage assessment does not replace that evaluation, and the importance of remaining in the ED until their evaluation is complete.    Glendia Rocky SAILOR, [place] 04/07/24 1555

## 2024-04-07 NOTE — ED Triage Notes (Signed)
 Pt c/o chest tightness, numbness in fingers of bilateral hands, productive cough, fevers, dizziness, diarrhea x 3 days; pt states he has been around people who are covid positive  Pt gives verbal consent for mse

## 2024-04-08 NOTE — ED Notes (Signed)
 Patient refused vital signs

## 2024-04-08 NOTE — ED Notes (Signed)
 Patient refused to have vital signs retaken.
